# Patient Record
Sex: Female | Born: 1945 | Race: White | Hispanic: No | Marital: Married | Smoking: Never smoker
Health system: Southern US, Community
[De-identification: ages and names within clinical notes are randomized; demographics above are authoritative.]

## PROBLEM LIST (undated history)

## (undated) DIAGNOSIS — M419 Scoliosis, unspecified: Secondary | ICD-10-CM

## (undated) DIAGNOSIS — T8859XA Other complications of anesthesia, initial encounter: Secondary | ICD-10-CM

## (undated) DIAGNOSIS — S6990XA Unspecified injury of unspecified wrist, hand and finger(s), initial encounter: Secondary | ICD-10-CM

## (undated) DIAGNOSIS — M1990 Unspecified osteoarthritis, unspecified site: Secondary | ICD-10-CM

## (undated) DIAGNOSIS — Z9889 Other specified postprocedural states: Secondary | ICD-10-CM

## (undated) DIAGNOSIS — H269 Unspecified cataract: Secondary | ICD-10-CM

## (undated) DIAGNOSIS — I10 Essential (primary) hypertension: Secondary | ICD-10-CM

## (undated) DIAGNOSIS — G709 Myoneural disorder, unspecified: Secondary | ICD-10-CM

## (undated) DIAGNOSIS — T4145XA Adverse effect of unspecified anesthetic, initial encounter: Secondary | ICD-10-CM

## (undated) DIAGNOSIS — C801 Malignant (primary) neoplasm, unspecified: Secondary | ICD-10-CM

## (undated) DIAGNOSIS — R112 Nausea with vomiting, unspecified: Secondary | ICD-10-CM

## (undated) HISTORY — DX: Unspecified osteoarthritis, unspecified site: M19.90

## (undated) HISTORY — DX: Essential (primary) hypertension: I10

## (undated) HISTORY — PX: KNEE SURGERY: SHX244

## (undated) HISTORY — DX: Unspecified injury of unspecified wrist, hand and finger(s), initial encounter: S69.90XA

## (undated) HISTORY — DX: Myoneural disorder, unspecified: G70.9

## (undated) HISTORY — DX: Scoliosis, unspecified: M41.9

## (undated) HISTORY — DX: Unspecified cataract: H26.9

## (undated) HISTORY — DX: Malignant (primary) neoplasm, unspecified: C80.1

## (undated) HISTORY — PX: TUBAL LIGATION: SHX77

## (undated) HISTORY — PX: SPINE SURGERY: SHX786

---
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
## 1998-06-20 ENCOUNTER — Ambulatory Visit (HOSPITAL_COMMUNITY): Admission: RE | Admit: 1998-06-20 | Discharge: 1998-06-20 | Payer: Self-pay | Admitting: Emergency Medicine

## 1998-08-03 ENCOUNTER — Other Ambulatory Visit: Admission: RE | Admit: 1998-08-03 | Discharge: 1998-08-03 | Payer: Self-pay | Admitting: Obstetrics and Gynecology

## 1999-07-24 ENCOUNTER — Other Ambulatory Visit: Admission: RE | Admit: 1999-07-24 | Discharge: 1999-07-24 | Payer: Self-pay | Admitting: Obstetrics and Gynecology

## 2000-07-24 ENCOUNTER — Other Ambulatory Visit: Admission: RE | Admit: 2000-07-24 | Discharge: 2000-07-24 | Payer: Self-pay | Admitting: Obstetrics and Gynecology

## 2000-10-21 HISTORY — PX: ABDOMINAL HYSTERECTOMY: SHX81

## 2001-03-10 ENCOUNTER — Encounter: Payer: Self-pay | Admitting: Obstetrics and Gynecology

## 2001-03-17 ENCOUNTER — Inpatient Hospital Stay (HOSPITAL_COMMUNITY): Admission: RE | Admit: 2001-03-17 | Discharge: 2001-03-19 | Payer: Self-pay | Admitting: Obstetrics and Gynecology

## 2001-03-17 ENCOUNTER — Encounter (INDEPENDENT_AMBULATORY_CARE_PROVIDER_SITE_OTHER): Payer: Self-pay

## 2004-10-21 HISTORY — PX: BREAST SURGERY: SHX581

## 2004-12-24 ENCOUNTER — Encounter: Admission: RE | Admit: 2004-12-24 | Discharge: 2004-12-24 | Payer: Self-pay | Admitting: General Surgery

## 2005-01-07 ENCOUNTER — Encounter (INDEPENDENT_AMBULATORY_CARE_PROVIDER_SITE_OTHER): Payer: Self-pay | Admitting: *Deleted

## 2005-01-07 ENCOUNTER — Ambulatory Visit (HOSPITAL_BASED_OUTPATIENT_CLINIC_OR_DEPARTMENT_OTHER): Admission: RE | Admit: 2005-01-07 | Discharge: 2005-01-07 | Payer: Self-pay | Admitting: General Surgery

## 2005-01-07 ENCOUNTER — Ambulatory Visit (HOSPITAL_COMMUNITY): Admission: RE | Admit: 2005-01-07 | Discharge: 2005-01-07 | Payer: Self-pay | Admitting: General Surgery

## 2005-01-08 ENCOUNTER — Ambulatory Visit: Payer: Self-pay | Admitting: Oncology

## 2005-01-16 ENCOUNTER — Ambulatory Visit (HOSPITAL_COMMUNITY): Admission: RE | Admit: 2005-01-16 | Discharge: 2005-01-16 | Payer: Self-pay | Admitting: Oncology

## 2005-01-28 ENCOUNTER — Ambulatory Visit: Admission: RE | Admit: 2005-01-28 | Discharge: 2005-04-22 | Payer: Self-pay | Admitting: Radiation Oncology

## 2005-03-19 ENCOUNTER — Ambulatory Visit: Payer: Self-pay | Admitting: Oncology

## 2005-05-08 ENCOUNTER — Ambulatory Visit: Admission: RE | Admit: 2005-05-08 | Discharge: 2005-05-08 | Payer: Self-pay | Admitting: Radiation Oncology

## 2005-07-12 ENCOUNTER — Ambulatory Visit: Payer: Self-pay | Admitting: Oncology

## 2006-01-07 DIAGNOSIS — C801 Malignant (primary) neoplasm, unspecified: Secondary | ICD-10-CM

## 2006-01-07 HISTORY — DX: Malignant (primary) neoplasm, unspecified: C80.1

## 2006-01-10 ENCOUNTER — Ambulatory Visit: Payer: Self-pay | Admitting: Oncology

## 2006-06-27 ENCOUNTER — Encounter: Admission: RE | Admit: 2006-06-27 | Discharge: 2006-06-27 | Payer: Self-pay | Admitting: Orthopedic Surgery

## 2006-07-03 ENCOUNTER — Ambulatory Visit: Payer: Self-pay | Admitting: Oncology

## 2006-07-07 LAB — CBC WITH DIFFERENTIAL/PLATELET
Basophils Absolute: 0.1 10*3/uL (ref 0.0–0.1)
Eosinophils Absolute: 0.1 10*3/uL (ref 0.0–0.5)
HGB: 12.6 g/dL (ref 11.6–15.9)
MCV: 91.7 fL (ref 81.0–101.0)
MONO%: 7 % (ref 0.0–13.0)
NEUT#: 2.9 10*3/uL (ref 1.5–6.5)
Platelets: 190 10*3/uL (ref 145–400)
RDW: 12.6 % (ref 11.3–14.5)

## 2006-07-07 LAB — COMPREHENSIVE METABOLIC PANEL
Albumin: 4.2 g/dL (ref 3.5–5.2)
CO2: 23 mEq/L (ref 19–32)
Calcium: 9.5 mg/dL (ref 8.4–10.5)
Chloride: 107 mEq/L (ref 96–112)
Glucose, Bld: 100 mg/dL — ABNORMAL HIGH (ref 70–99)
Sodium: 140 mEq/L (ref 135–145)
Total Bilirubin: 0.4 mg/dL (ref 0.3–1.2)
Total Protein: 6.6 g/dL (ref 6.0–8.3)

## 2006-07-15 ENCOUNTER — Ambulatory Visit: Payer: Self-pay | Admitting: Gastroenterology

## 2006-07-31 ENCOUNTER — Ambulatory Visit (HOSPITAL_COMMUNITY): Admission: RE | Admit: 2006-07-31 | Discharge: 2006-07-31 | Payer: Self-pay | Admitting: Gastroenterology

## 2006-08-07 ENCOUNTER — Ambulatory Visit: Payer: Self-pay | Admitting: Gastroenterology

## 2006-09-18 ENCOUNTER — Inpatient Hospital Stay (HOSPITAL_COMMUNITY): Admission: RE | Admit: 2006-09-18 | Discharge: 2006-09-21 | Payer: Self-pay | Admitting: Orthopaedic Surgery

## 2006-12-30 ENCOUNTER — Ambulatory Visit: Payer: Self-pay | Admitting: Oncology

## 2007-01-06 LAB — COMPREHENSIVE METABOLIC PANEL
ALT: 17 U/L (ref 0–35)
Albumin: 4.1 g/dL (ref 3.5–5.2)
CO2: 27 mEq/L (ref 19–32)
Chloride: 111 mEq/L (ref 96–112)
Glucose, Bld: 127 mg/dL — ABNORMAL HIGH (ref 70–99)
Potassium: 4.2 mEq/L (ref 3.5–5.3)
Sodium: 145 mEq/L (ref 135–145)
Total Protein: 6.6 g/dL (ref 6.0–8.3)

## 2007-01-06 LAB — CANCER ANTIGEN 27.29: CA 27.29: 20 U/mL (ref 0–39)

## 2007-01-06 LAB — CBC WITH DIFFERENTIAL/PLATELET
BASO%: 0.6 % (ref 0.0–2.0)
Eosinophils Absolute: 0.1 10*3/uL (ref 0.0–0.5)
MONO#: 0.3 10*3/uL (ref 0.1–0.9)
NEUT#: 3.6 10*3/uL (ref 1.5–6.5)
Platelets: 194 10*3/uL (ref 145–400)
RBC: 3.91 10*6/uL (ref 3.70–5.32)
RDW: 14.1 % (ref 11.3–14.5)
WBC: 6.3 10*3/uL (ref 3.9–10.0)
lymph#: 2.2 10*3/uL (ref 0.9–3.3)

## 2007-07-01 ENCOUNTER — Ambulatory Visit: Payer: Self-pay | Admitting: Oncology

## 2007-07-03 LAB — CBC WITH DIFFERENTIAL/PLATELET
BASO%: 0.7 % (ref 0.0–2.0)
EOS%: 1.6 % (ref 0.0–7.0)
LYMPH%: 40.2 % (ref 14.0–48.0)
MCH: 32.2 pg (ref 26.0–34.0)
MCHC: 35.3 g/dL (ref 32.0–36.0)
MONO#: 0.4 10*3/uL (ref 0.1–0.9)
RBC: 3.73 10*6/uL (ref 3.70–5.32)
WBC: 6.3 10*3/uL (ref 3.9–10.0)
lymph#: 2.5 10*3/uL (ref 0.9–3.3)

## 2007-07-03 LAB — COMPREHENSIVE METABOLIC PANEL
ALT: 21 U/L (ref 0–35)
AST: 20 U/L (ref 0–37)
CO2: 20 mEq/L (ref 19–32)
Creatinine, Ser: 0.97 mg/dL (ref 0.40–1.20)
Sodium: 138 mEq/L (ref 135–145)
Total Bilirubin: 0.3 mg/dL (ref 0.3–1.2)
Total Protein: 6.7 g/dL (ref 6.0–8.3)

## 2007-07-03 LAB — CANCER ANTIGEN 27.29: CA 27.29: 23 U/mL (ref 0–39)

## 2008-01-01 ENCOUNTER — Ambulatory Visit: Payer: Self-pay | Admitting: Oncology

## 2008-11-03 ENCOUNTER — Encounter: Admission: RE | Admit: 2008-11-03 | Discharge: 2008-11-03 | Payer: Self-pay | Admitting: Orthopaedic Surgery

## 2009-01-06 ENCOUNTER — Ambulatory Visit: Payer: Self-pay | Admitting: Oncology

## 2009-01-10 LAB — COMPREHENSIVE METABOLIC PANEL
AST: 17 U/L (ref 0–37)
Alkaline Phosphatase: 32 U/L — ABNORMAL LOW (ref 39–117)
BUN: 13 mg/dL (ref 6–23)
Calcium: 9.3 mg/dL (ref 8.4–10.5)
Creatinine, Ser: 1.02 mg/dL (ref 0.40–1.20)

## 2009-01-10 LAB — CBC WITH DIFFERENTIAL/PLATELET
Basophils Absolute: 0 10*3/uL (ref 0.0–0.1)
EOS%: 1.4 % (ref 0.0–7.0)
HCT: 32.9 % — ABNORMAL LOW (ref 34.8–46.6)
HGB: 11.3 g/dL — ABNORMAL LOW (ref 11.6–15.9)
MCH: 31.5 pg (ref 25.1–34.0)
MCV: 92.2 fL (ref 79.5–101.0)
MONO%: 6.5 % (ref 0.0–14.0)
NEUT%: 50.4 % (ref 38.4–76.8)

## 2010-01-05 ENCOUNTER — Ambulatory Visit: Payer: Self-pay | Admitting: Oncology

## 2010-01-09 LAB — CBC WITH DIFFERENTIAL/PLATELET
BASO%: 0.5 % (ref 0.0–2.0)
EOS%: 1.3 % (ref 0.0–7.0)
HCT: 35.6 % (ref 34.8–46.6)
MCH: 32.3 pg (ref 25.1–34.0)
MCHC: 34 g/dL (ref 31.5–36.0)
NEUT%: 53.6 % (ref 38.4–76.8)
RBC: 3.75 10*6/uL (ref 3.70–5.45)
lymph#: 2.9 10*3/uL (ref 0.9–3.3)

## 2010-01-09 LAB — COMPREHENSIVE METABOLIC PANEL
ALT: 26 U/L (ref 0–35)
AST: 27 U/L (ref 0–37)
Calcium: 9.6 mg/dL (ref 8.4–10.5)
Chloride: 110 mEq/L (ref 96–112)
Creatinine, Ser: 1.05 mg/dL (ref 0.40–1.20)
Sodium: 143 mEq/L (ref 135–145)
Total Bilirubin: 0.3 mg/dL (ref 0.3–1.2)

## 2010-07-30 ENCOUNTER — Ambulatory Visit: Payer: Self-pay | Admitting: Vascular Surgery

## 2010-10-21 HISTORY — PX: EYE SURGERY: SHX253

## 2010-10-30 ENCOUNTER — Ambulatory Visit: Admit: 2010-10-30 | Payer: Self-pay | Admitting: Vascular Surgery

## 2010-10-30 ENCOUNTER — Ambulatory Visit
Admission: RE | Admit: 2010-10-30 | Discharge: 2010-10-30 | Payer: Self-pay | Source: Home / Self Care | Attending: Vascular Surgery | Admitting: Vascular Surgery

## 2010-11-11 ENCOUNTER — Encounter: Payer: Self-pay | Admitting: Orthopedic Surgery

## 2010-12-03 ENCOUNTER — Other Ambulatory Visit (INDEPENDENT_AMBULATORY_CARE_PROVIDER_SITE_OTHER): Payer: Medicare Other | Admitting: Vascular Surgery

## 2010-12-03 DIAGNOSIS — I83893 Varicose veins of bilateral lower extremities with other complications: Secondary | ICD-10-CM

## 2010-12-04 NOTE — Assessment & Plan Note (Signed)
OFFICE VISIT  [name], [name] DOB:  Mar 25, 1946                                       12/03/2010 [id number]#:[id number]  Patient had 10-20 stab phlebectomies in the left posterior calf and popliteal area for painful varicosities.  She had previously had sclerotherapy performed at [facility] in 2007.  Stab phlebectomies were performed under local tumescent anesthesia, and she will return in about 6 weeks to be scheduled for 1 course of sclerotherapy to complete her treatment session.    [name], M.D. Electronically Signed  JDL/MEDQ  D:  12/03/2010  T:  12/04/2010  Job:  [id number]

## 2011-01-10 ENCOUNTER — Ambulatory Visit (INDEPENDENT_AMBULATORY_CARE_PROVIDER_SITE_OTHER): Payer: Medicare Other

## 2011-01-10 DIAGNOSIS — I83893 Varicose veins of bilateral lower extremities with other complications: Secondary | ICD-10-CM

## 2011-03-05 NOTE — Procedures (Signed)
LOWER EXTREMITY VENOUS REFLUX EXAM   INDICATION:  Painful lower extremity veins.   EXAM:  Using color-flow imaging and pulse Doppler spectral analysis, the  bilateral common femoral, superficial femoral, popliteal, posterior  tibial, greater and lesser saphenous veins were evaluated.  There is  evidence suggesting deep venous insufficiency in the bilateral lower  extremity (CFV, SFV).   The bilateral saphenofemoral junction is competent.  The bilateral GSV  is competent.  The bilateral anterior saphenous vein is competent.   The bilateral proximal short saphenous vein demonstrates competency.   The left SFV has 2 large branches at mid thigh that become superficial  and varicosed posterolaterally.   GSV Diameter (used if found to be incompetent only)                                            Right    Left  Proximal Greater Saphenous Vein           cm       cm  Proximal-to-mid-thigh                     cm       cm  Mid thigh                                 cm       cm  Mid-distal thigh                          cm       cm  Distal thigh                              cm       cm  Knee                                      cm       cm   IMPRESSION:  1. The bilateral greater saphenous vein is competent.  2. The bilateral greater saphenous vein is not tortuous.  3. The deep venous system is not competent with reflux of >500      milliseconds.  4. The bilateral short saphenous vein is competent.  5. Branches observed at left mid thigh superficial femoral vein that      become superficial and varicosed.         ___________________________________________  Quita Skye Hart [place], M.D.   LT/MEDQ  D:  10/30/2010  T:  10/30/2010  Job:  [id number]

## 2011-03-05 NOTE — Consult Note (Signed)
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  She has become increasingly  symptomatic over the last 4 years.  She had treatment performed at [facility] in 2007 which consisted of primary  sclerotherapy.  She states that she had some initial relief, but these  rapidly recurred and she has been having increasing throbbing, burning,  aching discomfort with a heaviness behind her left knee and swelling as  the day progresses.  She does not wear elastic compression stockings nor  elevate the legs on a regular basis or take pain medication.  She has  had no history of bleeding ulceration, but mainly has pain and swelling  affecting her daily living.  She has multiple spider veins in the  contralateral right leg, but no symptoms.   CHRONIC MEDICAL PROBLEMS:  1. Hypertension.  2. Hyperlipidemia.  3. Scoliosis with previous lumbar spine surgery on two occasions.  4. History of breast cancer treated by radiation and medication.  5. Negative coronary artery disease, diabetes or stroke.   SOCIAL HISTORY:  She is married, has two children, is retired, does not  use tobacco or alcohol.   FAMILY HISTORY:  Positive for congestive heart failure, diabetes in  mother.  Negative for stroke.   REVIEW OF SYSTEMS:  Denies any chest pain, dyspnea on exertion.  No  productive cough, hemoptysis, asthma.  Does have arthritis.  Denies any  GI or GU symptoms.  All other systems are negative in review of systems.   PHYSICAL EXAM:  VITAL SIGNS:  Blood pressure 128/83, heart rate 95,  respirations 24.  GENERAL:  She is well-developed, well-nourished female in no apparent  distress.  Alert and oriented x3.  HEENT:  Exam is normal for age.  EOMs intact.  LUNGS:  Clear to  auscultation.  No rhonchi or wheezing.  CARDIOVASCULAR:  Regular rhythm.  No murmurs.  Carotid pulses 3+ no  bruits.  ABDOMEN:  Soft, nontender with no masses.  MUSCULOSKELETAL:  Exam is free of major deformities.  NEUROLOGY:  Normal.  SKIN:  Free of rashes.  EXTREMITIES:  Lower extremity exam reveals 3+ femoral, popliteal and  dorsalis pedis pulses bilaterally.  Left leg has bulging varicosities in  the popliteal fossa beginning in the distal thigh extending laterally  and then into the medial calf with diffuse spider veins in the calf as  well.  There is no hyperpigmentation or ulceration noted in the left  leg.  Right leg has multiple spider veins in the anterior thigh  laterally and down to the knee level.  Some also in the posterior calf  on the right with no edema.   I did perform a bedside SonoSite ultrasound exam today of the great  saphenous veins, did not see any gross reflux in either great saphenous  vein at the saphenofemoral junction.   I think this patient is having significant symptoms from bulging  varicosities in the left calf.  Will treat her initially with long-leg  elastic compression stockings bilaterally (20 mm - 30 mL gradient) as  well as elevation and ibuprofen on a  regular basis.  She will return in  3 months and we will perform a formal venous duplex exam in the vascular  lab to see what treatment options are available to try to relieve the  symptoms.     Quita Skye Hart Rochester, M.D.  Electronically Signed   JDL/MEDQ  D:  07/30/2010  T:  07/31/2010  Job:  [id number]   cc:   Brett Canales A. Cleta Alberts, M.D.

## 2011-03-05 NOTE — Assessment & Plan Note (Signed)
OFFICE VISIT   [name], [name]  DOB:  1946-08-09                                       10/30/2010  [id number]#:[id number]   Patient returns today for further follow-up regarding venous  insufficiency of the left calf.  She has had sclerotherapy performed at  [facility] in 2007 and developed recurrence over  a period of time and now has severe throbbing, burning, and aching  discomfort in the left calf with heaviness behind the knee and swelling  in the distal ankle as the day compresses.  She has worn long-leg  elastic compression stockings over the past 3 months (20 mm - 30 mm  gradient), has elevating her legs as much as possible, and taken  ibuprofen without success.  She does not have claudication symptoms and  has no symptoms in the contralateral right leg.   PHYSICAL EXAMINATION:  Today, her blood pressure is 124/82, heart rate  is 87, respirations 20.  Generally, she is a well-developed and well-  nourished female in no apparent distress, alert and oriented x3.  HEENT:  Normal for age.  EOMs intact.  Lower extremity exam on the left reveals  3+ femoral, popliteal, and dorsalis pedis pulse palpable.  She has  bulging varicosities on the left popliteal fossa extending into the mid  calf with reticular veins emanating from this down distally in the  posterior calf.  There is 1+ edema in the left ankle.  The right leg is  unremarkable.   Today I ordered a venous duplex exam, which I reviewed and interpreted.  The great saphenous and small saphenous veins in the left are competent  and unremarkable.  The deep venous system on the left, however, is  incompetent with reflux due to valvular incompetence.   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  I think the best treatment would be:  1.  Stab  phlebectomies of the  bulging varicosities in the calf.  2:  One course  of sclerotherapy.   I have discussed with her the fact that this could recur at a later date  because of her deep venous insufficiency we cannot treat except for  elastic compression, but I do believe that the bulging varicosities are  causing the symptoms which she is currently experiencing, which could be  treated locally.  We will proceed with precertification for this and  performed in the near future.     Quita Skye Hart Rochester, M.D.  Electronically Signed   JDL/MEDQ  D:  10/30/2010  T:  10/30/2010  Job:  [id number]

## 2011-03-08 NOTE — H&P (Signed)
NAMEGWENDOLEN, [name]                  ACCOUNT NO.:  [id number]   MEDICAL RECORD NO.:  [id number]          PATIENT TYPE:  INP   LOCATION:  NA                           FACILITY:  [facility]   PHYSICIAN:  Verlin Fester, P.A.    DATE OF BIRTH:  1945/12/28   DATE OF ADMISSION:  DATE OF DISCHARGE:                                HISTORY & PHYSICAL   CHIEF COMPLAINT:  Low back pain.   HISTORY OF PRESENT ILLNESS:  Patient is a 65 year old female who has had  longstanding problems with her low back.  She has tried numerous types of  conservative treatment, all without any significant improvement in her pain.  Secondary to this, as well as her increasing and severe pain, it was felt  her best course of management would be a revision laminectomy and posterior  spinal fusion L2-L4, as well as a posterior spinal fusion L3-4.  Risks and  benefits of both procedures were discussed with the patient by Dr. Noel Gerold as  well as myself.  She indicated understanding and opted to proceed.   ALLERGIES:  None.   MEDICATIONS:  1. Tamoxifen 50 mg daily.  2. Lisinopril 10 mg daily.  3. Celebrex 200 mg daily.  4. Simvastatin 40 mg daily.  5. Aspirin 81 mg daily, which she is stopping in preparation for surgery.  6. Niacin 500 mg daily.  7. Omega 3 fish oil 1000 mg daily.  8. B-complex vitamin daily.   PAST MEDICAL HISTORY:  1. Hypertension  2. Breast cancer.  3. Hypercholesterolemia.   PAST SURGICAL HISTORY:  1. Spinal fusion in 1982.  2. Hysterectomy in 2002.  3. Lumpectomy in 2006.   SOCIAL HISTORY:  Patient denies tobacco use, denies alcohol use.  She is  married, has 2 grown children.  Her family will be available to help her  postoperatively.   FAMILY HISTORY:  Noncontributory.   REVIEW OF SYSTEMS:  Patient denies fevers, chills, sweats, or bleeding  tendencies.  CNS:  Denies blurred vision, double vision, seizures, headache, or  paralysis.  CARDIOVASCULAR:  Denies chest pain, angina, orthopnea,  claudication, or  palpitations.  PULMONARY:  Denies shortness of breath, productive cough, hemoptysis.  GI:  Denies nausea, vomiting, constipation, diarrhea, melena, or bloody  stools  GU:  Denies dysuria, hematuria, or discharge.  MUSCULOSKELETAL:  As per HPI.   PHYSICAL EXAMINATION:  Vital signs:  Blood pressure is 118/62, respirations  18 and unlabored, pulse was 84 and regular.  GENERAL APPEARANCE:  Patient is a 65 year old white female who is alert and  oriented, no acute distress.  She is well nourished, well groomed, appears  stated age; cooperative with exam.  HEENT:  Head is normocephalic, atraumatic.  Pupils are equal, round, and  reactive to light.  Extraocular movements intact.  Nares are patent, pharynx  is clear.  NECK:  Is supple to palpation, no lymphadenopathy, thyromegaly, or bruits  appreciated.  CHEST:  Is clear to auscultation bilaterally.  No rales, rhonchi, stridor,  wheezes, or friction rubs.  BREAST:  Not pertinent, not performed.  HEART:  Regular rate  and rhythm.  No murmurs, gallops, or rubs noted.  ABDOMEN:  Is soft to palpation, non tender, non distended, no organomegaly  noted.  GU:  Not pertinent, not performed.  EXTREMITIES:  As per HPI.  SKIN:  Is intact without any lesions or rashes.   IMPRESSION:  1. Degenerative spondylolisthesis and spinal stenosis.  2. History of breast cancer.  3. Hypertension.  4. Hypercholesterolemia.   PLAN:  1. Admit to [facility] on September 18, 2006 for revision      laminectomy L2-4, and a posterior spinal fusion of L3-4.  Surgeon will      be Dr. Sharolyn Douglas.  2. Patient's primary care doctor is Dr. Lesle Chris.      Verlin Fester, P.A.     CM/MEDQ  D:  09/04/2006  T:  09/04/2006  Job:  [id number]   cc:   Sharolyn Douglas, M.D.

## 2011-03-08 NOTE — Op Note (Signed)
[name], [name]                  ACCOUNT NO.:  [id number]   MEDICAL RECORD NO.:  [id number]          PATIENT TYPE:  INP   LOCATION:  5008                         FACILITY:  [facility]   PHYSICIAN:  Sharolyn Douglas, M.D.        DATE OF BIRTH:  1946/08/19   DATE OF PROCEDURE:  09/18/2006  DATE OF DISCHARGE:                               OPERATIVE REPORT   DIAGNOSIS:  1. L3-L4 degenerative spondylolisthesis above previous L4 through S1      fusion.  2. Lumbar spinal stenosis L3-L4 and L2-L3.  3. Post laminectomy syndrome.   PROCEDURE:  1. Revision L2-L3 and L3-L4 lumbar laminectomy with wide decompression      of the thecal sac and nerve roots bilaterally.  2. Posterior lumbar fusion L3-L4.  3. Pedicle screw instrumentation L3-L4 using the Encompass system.  4. Transforaminal lumbar interbody fusion L3-L4 with placement of 9-mm      PEEK cage.  5. Local autogenous bone graft supplemented with bone morphogenic      protein.   SURGEON:  Sharolyn Douglas, M.D.   ASSISTANTJill Side Mahar, P.A.-C.   ANESTHESIA:  General endotracheal.   ESTIMATED BLOOD LOSS:  20 mL.   COMPLICATIONS:  None.   COUNTS:  Needle and sponge count correct.   INDICATIONS:  The patient is a pleasant 65 year old female who is status  post previous L4 through S1 decompression and fusion.  She has had  progressively worsening back and lower extremity pain, left greater than  right.  Her imaging studies shows severe spondylosis with degenerative  spondylolisthesis above the fusion at L3-L4.  MRI shows severe recurrent  spinal stenosis above the fusion at L2-L3 and L3-L4.  She now presents  for revision, decompression and fusion.  The risks, benefits, and  alternatives were reviewed.   DESCRIPTION OF PROCEDURE:  After informed consent, she was taken to the  operating room and underwent general endotracheal anesthesia without  difficulty and given prophylactic IV antibiotics.  She was carefully  turned prone onto the  Wilson frame.  All bony prominences padded.  Her  face and eyes were protected at all times.  The back was prepped and  draped in the usual sterile fashion.  Neural monitoring had been  established in the form of lower extremity EMGs and SSEPs.  The superior  portion of the previous incision was utilized and extended proximally.  Dissection was carried through the scar and deep fascia.  Subperiosteal  exposure was carried out to the tips of the transverse process of L3 and  L4 bilaterally.  The facet joints were hypertrophied.  Interoperative x-  ray was taken to confirm the levels.  A deep retractor was placed.  We  then completed a wide laminectomy by removing the residual spinous  process of L3 and the spinous process of L2.  Using the high speed bur  and Leksell rongeur, the laminae were thinned.  We then identified the  edges of the previous laminectomy and used curets along with headlight  and loupe magnification to carefully dissect the epidural fibrosis.  We  were then able to gain access to the spinal canal and extended the  decompression proximally.  We then took the decompression out flush with  the pedicles bilaterally, identifying the L3 and L4 nerve roots and  confirming that they were decompressed in the lateral recess and out  towards the foramen.   At this point, we turned our attention to placing pedicle screws at L3  and L4 using anatomic probing technique.  Because of the distorted  anatomy, we were able to palpate the pedicles of L4 from within the  spinal canal.  Each pedicle hole was started with the awl followed by  the Stephe pedicle probe.  We palpated the pedicles and there were no  breeches.  Each pedicle was tapped.  We then placed 6.5 x 50 mm screws  at L3 and 6.5 by 45 mm screws at L4.  Bone quality was average and the  screw purchase was good.  We stimulated each pedicle screw with  triggered EMGs and there were no deleterious changes.  It should be  noted  that before placing pedicle screws, we decorticated the transverse  processes of L3 and L4 in preparation for the fusion.   At this point, we elected to proceed with a transforaminal lumbar  interbody fusion at L3-L4 on the left side to further decompress the  neural foramen to help reduce the spondylolisthesis and also to improve  the fusion rate.  The remaining facet joint was osteotomized on the left  side.  The exiting transversing nerve roots were identified and free  running EMGs were monitored.  The disc space was entered and a radical  discectomy was completed. The cartilaginous endplates were scraped  clean.  The disc space was dilated up to 9 mm.  We then packed the disc  space with local bone graft from the laminectomy along with BMP sponges.  A 9 mm PEEK cage was then packed with a BMP sponge and inserted into the  interspace, carefully tamped anteriorly and across the midline.  We had  no changes in the free running EMGs throughout the procedure.  We then  turned our attention to placing 40 mm titanium rods into the polyaxial  screw heads.  Gentle compression was applied before shearing off the  locking caps..  Interoperative x-ray showed good position of the  instrumentation at L3-L4.  We then completed the posterior spinal fusion  by packing the remaining local bone graft into the gutters over the  transverse processes.  This graft had been obtained from the  laminectomy, cleaned and morselized.   Hemostasis was achieved.  A deep Hemovac drain was left and the deep  fascia closed with a running #1 Vicryl suture.  The subcutaneous layer  was closed with 2-0 Vicryl suture followed by a running 3-0 subcuticular  Vicryl suture on the skin edges.  Benzoin and Steri-Strips were placed.  A sterile dressing was applied.  The patient was turned supine,  extubated without difficulty, and transferred to recovery in stable condition able to move her upper and lower extremities.   It  should be noted my assistant [facility], P.A.-C., was present  throughout procedure including during the positioning, the exposure, the  decompression, the fusion, the instrumentation, and she also assisted  with wound closure.      Sharolyn Douglas, M.D.  Electronically Signed     MC/MEDQ  D:  09/18/2006  T:  09/18/2006  Job:  [id number]

## 2011-03-08 NOTE — Discharge Summary (Signed)
NAMECHANEE, HENRICKSON                  ACCOUNT NO.:  [id number]   MEDICAL RECORD NO.:  [id number]          PATIENT TYPE:  INP   LOCATION:  5008                         FACILITY:  [facility]   PHYSICIAN:  Verlin Fester, P.A.    DATE OF BIRTH:  July 19, 1946   DATE OF ADMISSION:  09/18/2006  DATE OF DISCHARGE:  09/21/2006                               DISCHARGE SUMMARY   ADMITTING DIAGNOSES:  1. L2 to L4 spinal stenosis and L3-4 spondylolisthesis.  2. Hypertension.  3. History of breast cancer.  4. Hypercholesterolemia.   DISCHARGE DIAGNOSES:  1. Status post revision of laminectomy L2 to L4, posterior spinal      fusion L3-4.  2. Postoperative blood loss anemia.  3. Postoperative hypokalemia.  4. L2 to L4 spinal stenosis and L3-4 spondylolisthesis.  5. Hypertension.  6. History of breast cancer.  7. Hypercholesterolemia.   PROCEDURE:  On September 18, 2006, the patient was taken to the operating  room for a revision laminectomy L2-L4 and a posterior spinal fusion L3-4  with pedicle screws and TLIF.  Surgery was done by Sharolyn Douglas, assistant  [facility], P.A.-C.  Anesthesia was general.   CONSULTS:  None.   LABS:  CBC with differential preop was normal.  H&H postop was monitored  daily.  Reached a low of 9.6 and 27.8 on September 20, 2006.  PT/INR, PTT  preop normal.  Complete metabolic panel preop normal with exception of  glucose of 102 and ALP of 34.  Basic metabolic panel monitored x2 days  postoperatively did show a glucose of 128 on September 19, 2006; glucose  of 131, potassium of 3.3, and calcium of 8.3 on September 20, 2006.  UA  was negative preop.  Blood type was A+, antibody screen was negative.  Urine culture showed insignificant growth on September 17, 2006.  X-rays  done on September 18, 2006, intraoperatively.  On September 21, 2006,  showed stable appearance of L3-4 fusion.   BRIEF HISTORY:  The patient is a 65 year old female with a long history  of problems with her back.   The patient's pain has been increasingly  getting worse over the last several months.  It has gotten to the point  that it is severe.  Her imaging studies did show severe spondylosis with  degenerative spondylolisthesis, as well spinal stenosis.  She failed to  respond to conservative treatment, __________ management and agreed to  decompression as well as posterior spinal fusion.  The risks and  benefits of the surgery were discussed with the patient by Dr. Noel Gerold.  She indicated understanding and opted to proceed.   HOSPITAL COURSE:  The patient was admitted to the hospital on September 18, 2006, was taken to the operating room for the above procedure.  She  tolerated the procedure well without any intraoperative complications.  She was transferred to the recovery room in stable condition.   Postoperatively routine orthopedic spine protocol was followed.  The  patient progressed along very well.   She worked with physical therapy and occupational therapy on a daily  basis and made  good progress with them.  She was safe and independent  with her brace use, ambulation, as well as back precautions prior to her  discharge home.   By September 21, 2006, the patient had met all orthopedic goals.  She was  doing very well and she was medically stable and ready for discharge  home.   DISCHARGE PLAN:  The patient is a 65 year old female status post  posterior spinal fusion and laminectomy revision, doing well.  Activity  is daily ambulation program.  Brace on when she is up.  No lifting  heavier than 5 pounds.  Back precautions at all times.  The patient may  shower.   MEDICATIONS:  1. Percocet for pain.  2. Robaxin for muscle spasms.  3. Multivitamin daily.  4. Calcium daily.  5. Colace twice daily.  6. Laxative as needed.   FOLLOWUP:  Two weeks postoperatively with Dr. Noel Gerold.   DIET:  Regular and as tolerated.   CONDITION ON DISCHARGE:  Stable and improved.      Verlin Fester, P.A.     CM/MEDQ  D:  11/27/2006  T:  11/27/2006  Job:  [id number]

## 2011-03-08 NOTE — Assessment & Plan Note (Signed)
[facility]                           GASTROENTEROLOGY OFFICE NOTE   NAME:[name]                       MRN:          [id number]  DATE:07/15/2006                            DOB:          [birth year]    REFERRING PHYSICIAN:  Valentino Hue. Magrinat, M.D.   REASON FOR REFERRAL:  Dr. Darnelle Catalan asked me to evaluate [name] in  consultation regarding abnormal pancreas on recent MRI.   HISTORY OF PRESENT ILLNESS:  [name] is a very pleasant 65 year old woman  who was diagnosed with early stage breast cancer one year ago. She underwent  lumpectomy and radiation but no chemotherapy and has been doing very well on  followup studies. She recently had some back pains. Underwent imaging. This  imaging study found some renal cysts and was followed up with an MRI. The  MRI confirmed multiple renal cysts and was followed up with an MRI. The MRI  confirmed multiple renal cysts and also showed a cystic lesion in the head  of her pancreas. I reviewed the MRI myself and discussed this with  radiologist. There is a possibly bilobed cystic area in the head of her  pancreas. This does seem to communicate with the common bile duct. She has  no ductal dilation of her common bile duct or her pancreatic duct. She was  referred to me for potential further evaluation of this area. Of note her  back pain has been found to be from degenerative disk disease, and she is  meeting with a surgeon tomorrow to schedule surgery for her back pain.   REVIEW OF SYSTEMS:  Noted for stable weight. No pancreatic disease. Rest of  Review of Systems is essentially normal and is available on our nursing  intake sheet.   PAST MEDICAL HISTORY:  Degenerative disk disease as above, breast cancer as  above, hypertension, arthritis, status post hysterectomy.   CURRENT MEDICATIONS:  Tamoxifen, lisinopril, fish oil, niacin, aspirin, B  complex.   ALLERGIES:  No known drug allergies.   SOCIAL  HISTORY:  Married with two daughters, nonsmoker, nondrinker.   FAMILY HISTORY:  No pancreatic disease or pancreatic cancers in the family.   PHYSICAL EXAMINATION:  VITAL SIGNS:  5 feet 7 inches, 182 pounds, blood  pressure 104/60, pulse 72.  CONSTITUTIONAL:  Generally well-appearing.  NEUROLOGICAL:  Alert and oriented x3.  HEENT:  Eyes:  Extraocular movements intact. Oropharynx moist. No lesions.  NECK:  Supple. No lymphadenopathy.  CARDIOVASCULAR:  Heart regular rate and rhythm.  LUNGS:  Clear to auscultation bilaterally.  ABDOMEN:  Soft, nontender, nondistended, normal bowel sounds.  EXTREMITIES:  No lower extremity edema.  SKIN:  No rashes or lesions in distal extremities.   ASSESSMENT AND PLAN:  A 64 year old woman with incidentally found cyst in  the head of her pancreas possibly communicating with her common bile duct.   It is not clear whether this represents a true pancreatic cyst or whether  this is a choledochal cyst which would probably be a type 3 if it is a  choledochal cyst. She has no concerning history. She is most  bothered by her  continued back pain. I think it is reasonable for her to go ahead and  proceed with spinal surgery as she was planning to do, and we will pick up  this issue afterwards. I will plan at that time to proceed with endoscopic  ultrasound plus or minus ERCP at that time. I think it is important to  define this lesion's communication with the bile duct and also to sample  some fluid from it if it is not connecting with the bile duct to relatively  risk stratify her. She will therefore get in touch with [place] after her spinal  surgery and when she is ready to proceed with the EUS plus or minus ERCP. I  see no reason for further imaging studies prior to then. However, she will  get a CBC, complete metabolic profile, and a CA19-9 now.                                   Rachael Fee, MD   DPJ/MedQ  DD:  07/15/2006  DT:  07/17/2006  Job #:   [id number]   cc:   Valentino Hue. Magrinat, M.D.

## 2011-11-04 ENCOUNTER — Other Ambulatory Visit: Payer: Self-pay | Admitting: Dermatology

## 2012-01-28 ENCOUNTER — Encounter: Payer: Self-pay | Admitting: Physician Assistant

## 2012-02-10 ENCOUNTER — Ambulatory Visit (INDEPENDENT_AMBULATORY_CARE_PROVIDER_SITE_OTHER): Payer: Medicare Other | Admitting: *Deleted

## 2012-02-10 VITALS — BP 124/74 | HR 73 | Temp 97.3°F | Resp 18 | Ht 65.0 in | Wt 185.8 lb

## 2012-02-10 DIAGNOSIS — Z23 Encounter for immunization: Secondary | ICD-10-CM

## 2012-02-10 MED ORDER — HEPATITIS B VAC RECOMBINANT 5 MCG/0.5ML IJ SUSP
0.5000 mL | Freq: Once | INTRAMUSCULAR | Status: AC
  Administered 2012-02-10: 5 ug via INTRAMUSCULAR

## 2012-08-20 ENCOUNTER — Encounter: Payer: Medicare Other | Admitting: Family Medicine

## 2012-09-11 ENCOUNTER — Ambulatory Visit (INDEPENDENT_AMBULATORY_CARE_PROVIDER_SITE_OTHER): Payer: Medicare Other | Admitting: Family Medicine

## 2012-09-11 ENCOUNTER — Encounter: Payer: Self-pay | Admitting: Family Medicine

## 2012-09-11 VITALS — BP 130/84 | HR 81 | Temp 98.2°F | Resp 16 | Ht 65.5 in | Wt 185.2 lb

## 2012-09-11 DIAGNOSIS — IMO0002 Reserved for concepts with insufficient information to code with codable children: Secondary | ICD-10-CM

## 2012-09-11 DIAGNOSIS — Z0000 Encounter for general adult medical examination without abnormal findings: Secondary | ICD-10-CM

## 2012-09-11 DIAGNOSIS — M5412 Radiculopathy, cervical region: Secondary | ICD-10-CM

## 2012-09-11 DIAGNOSIS — I10 Essential (primary) hypertension: Secondary | ICD-10-CM

## 2012-09-11 DIAGNOSIS — M5416 Radiculopathy, lumbar region: Secondary | ICD-10-CM

## 2012-09-11 DIAGNOSIS — M47812 Spondylosis without myelopathy or radiculopathy, cervical region: Secondary | ICD-10-CM

## 2012-09-11 LAB — COMPREHENSIVE METABOLIC PANEL
ALT: 19 U/L (ref 0–35)
Albumin: 4.4 g/dL (ref 3.5–5.2)
Alkaline Phosphatase: 53 U/L (ref 39–117)
Glucose, Bld: 99 mg/dL (ref 70–99)
Potassium: 4.1 mEq/L (ref 3.5–5.3)
Sodium: 139 mEq/L (ref 135–145)
Total Bilirubin: 0.6 mg/dL (ref 0.3–1.2)
Total Protein: 6.9 g/dL (ref 6.0–8.3)

## 2012-09-11 LAB — LIPID PANEL
HDL: 44 mg/dL (ref >39)
LDL Cholesterol: 160 mg/dL — ABNORMAL HIGH (ref 0–99)
Total CHOL/HDL Ratio: 5.4 Ratio
Triglycerides: 168 mg/dL — ABNORMAL HIGH (ref <150)
VLDL: 34 mg/dL (ref 0–40)

## 2012-09-11 LAB — CBC WITH DIFFERENTIAL/PLATELET
Basophils Absolute: 0.1 10*3/uL (ref 0.0–0.1)
Basophils Relative: 1 % (ref 0–1)
HCT: 38.5 % (ref 36.0–46.0)
Lymphocytes Relative: 43 % (ref 12–46)
MCHC: 33.8 g/dL (ref 30.0–36.0)
Monocytes Absolute: 0.3 10*3/uL (ref 0.1–1.0)
Neutro Abs: 2.8 10*3/uL (ref 1.7–7.7)
Neutrophils Relative %: 49 % (ref 43–77)
Platelets: 220 10*3/uL (ref 150–400)
RDW: 13.2 % (ref 11.5–15.5)
WBC: 5.7 10*3/uL (ref 4.0–10.5)

## 2012-09-11 MED ORDER — LISINOPRIL 10 MG PO TABS: 10.0000 mg | ORAL_TABLET | Freq: Every day | ORAL | Status: DC

## 2012-09-11 MED ORDER — GABAPENTIN 100 MG PO CAPS: 100.0000 mg | ORAL_CAPSULE | Freq: Three times a day (TID) | ORAL | Status: DC

## 2012-09-11 MED ORDER — MELOXICAM 15 MG PO TABS: 15.0000 mg | ORAL_TABLET | Freq: Every day | ORAL | Status: DC

## 2012-09-11 NOTE — Progress Notes (Signed)
Subjective:    Patient ID: [name], female    DOB: June 10, 1946, 66 y.o.   MRN: [id number] Chief Complaint  Patient presents with  . Annual Exam    HPI Having a lot more nerve pain so her orthopedic spine surgeon suggested that she see a neurosurgeon.  Has had injections in past which has helped.   Was seeing Dr. Ledon Snare who is no longer there Dr. Noel Gerold suggested doing another multi-level fusion - 5-6 levels but she is not interested in that but she had c-spine xrays done here and saw a lot of bone spurs so she assumes that is where she is coming from After her second fusion she took 2 yrs to recover and she had to quit her job as it was to active.  THe first one worked well - like a miracle - but doesn't want to do third Has been mentioned to her that she should try gabapentin and elavil - doesn't like to take medications so hasn't before now but sxs are worsening so she is ready. Has been on mobic for 8-10 yrs and worried about long term effects but doesn't think she is going to be able to get off of it as sxs seem to be worsening. Stomach is rumbling and aching at night. Was previously on celebrex and vioxx.  Fasting. Was previously on simvastatin but so was husband and he had some abnormal ekgs and stress test and the [facility] cardiologist told him that it is a killer - told him to throw away the statin as it was flushing out all the nourishment that muscles including the heart needed and did think that aches and pains got better after she stopped it.  HTN - thinks this is due to the meloxicam - bp drops when she is off of it.  Does not need any more paps as had a hysterectomy due to uterine prolapse (and had bladder tack)  Not on calcium supp as constipating but gets dairy in every day.  Past Medical History  Diagnosis Date  . Arthritis   . Cancer   . Neuromuscular disorder   . Cataract   . Hypertension    Past Surgical History  Procedure Date  . Breast surgery 2006    left    . Eye surgery 2012  . Spine surgery 1981 and 2007  . Abdominal hysterectomy 2002    have ovaries   Family History  Problem Relation Age of Onset  . Diabetes Mother     age early 83's  . Heart disease Mother     age early 40's or 80's  . Stroke Father 42    per pt mini  . Diabetes Sister 16    type II  . Kidney disease Brother     polycystic kidney  . Alcohol abuse Maternal Grandfather    Current Outpatient Prescriptions on File Prior to Visit  Medication Sig Dispense Refill  . b complex vitamins tablet Take 1 tablet by mouth daily.      . cholecalciferol (VITAMIN D) 1000 UNITS tablet Take 1,000 Units by mouth 2 (two) times daily.            Marland Kitchen lisinopril (PRINIVIL,ZESTRIL) 10 MG tablet Take 1 tablet (10 mg total) by mouth daily.  90 tablet  3  . Omega-3 Fatty Acids (FISH OIL) 1000 MG CAPS Take 1,000 mg by mouth 2 (two) times daily.       Allergies  Allergen Reactions  . Codeine Other (See  Comments)    DIZZINESS   History   Social History  . Marital Status: Married    Spouse Name: N/A    Number of Children: N/A  . Years of Education: N/A   Occupational History  . Not on file.   Social History Main Topics  . Smoking status: Never Smoker   . Smokeless tobacco: Not on file  . Alcohol Use: Not on file  . Drug Use: Not on file  . Sexually Active: Not on file   Other Topics Concern  . Not on file   Social History Narrative   Exercise swimming 3 times/wk and silver sneaker 2 times/wk for 1 hour    Review of Systems  Constitutional: Positive for activity change and fatigue.  Gastrointestinal: Positive for abdominal pain and constipation.  Musculoskeletal: Positive for myalgias, back pain, arthralgias and gait problem. Negative for joint swelling.  Neurological: Positive for weakness.  All other systems reviewed and are negative.      BP 130/84  Pulse 81  Temp 98.2 F (36.8 C) (Oral)  Resp 16  Ht 5' 5.5" (1.664 m)  Wt 185 lb 3.2 oz (84.006 kg)  BMI  30.35 kg/m2  SpO2 96% Objective:   Physical Exam  Constitutional: She is oriented to person, place, and time. She appears well-developed and well-nourished. No distress.  HENT:  Head: Normocephalic and atraumatic.  Right Ear: Tympanic membrane, external ear and ear canal normal.  Left Ear: Tympanic membrane, external ear and ear canal normal.  Nose: Nose normal. No mucosal edema or rhinorrhea.  Mouth/Throat: Uvula is midline, oropharynx is clear and moist and mucous membranes are normal. No posterior oropharyngeal erythema.  Eyes: Conjunctivae normal and EOM are normal. Pupils are equal, round, and reactive to light. Right eye exhibits no discharge. Left eye exhibits no discharge. No scleral icterus.  Neck: Normal range of motion. Neck supple. No thyromegaly present.  Cardiovascular: Normal rate, regular rhythm, normal heart sounds and intact distal pulses.   Pulmonary/Chest: Effort normal and breath sounds normal. No respiratory distress.  Abdominal: Soft. Bowel sounds are normal. There is no tenderness.  Genitourinary: No breast swelling, tenderness, discharge or bleeding.  Musculoskeletal: She exhibits tenderness. She exhibits no edema.  Lymphadenopathy:    She has no cervical adenopathy.  Neurological: She is alert and oriented to person, place, and time. She has normal reflexes.  Skin: Skin is warm and dry. She is not diaphoretic. No erythema.  Psychiatric: She has a normal mood and affect. Her behavior is normal.      Assessment & Plan:   1. Cervical arthritis  meloxicam (MOBIC) 15 MG tablet  2. Routine general medical examination at a health care facility  Lipid panel, CBC with Differential, Comprehensive metabolic panel.  Going to have mammogram early feb at [place] - will add on dexa scan then too.  3. Cervical radiculopathy  discussed multiple options w/ pt - concerned about TCA side effects and she is senstive to meds to will start low dose of gabapentin (NEURONTIN) 100 MG  capsule qhs and titrate up slowly as tolerated to tid.  4. Lumbar radiculopathy, chronic  gabapentin (NEURONTIN) 100 MG capsule.  Difficult to afford topical lidocaine patches and not working as well now so will investigate a compounded topical solution through [facility] w/ topical anesthetic, muscle relaxant, nsaid.  5. Hypertension  Cont lisinopril (PRINIVIL,ZESTRIL) 10 MG tablet

## 2012-09-18 ENCOUNTER — Telehealth: Payer: Self-pay

## 2012-09-18 DIAGNOSIS — I10 Essential (primary) hypertension: Secondary | ICD-10-CM

## 2012-09-18 DIAGNOSIS — M5416 Radiculopathy, lumbar region: Secondary | ICD-10-CM

## 2012-09-18 DIAGNOSIS — M47812 Spondylosis without myelopathy or radiculopathy, cervical region: Secondary | ICD-10-CM

## 2012-09-18 DIAGNOSIS — M5412 Radiculopathy, cervical region: Secondary | ICD-10-CM

## 2012-09-18 MED ORDER — LISINOPRIL 10 MG PO TABS: 10.0000 mg | ORAL_TABLET | Freq: Every day | ORAL | Status: DC

## 2012-09-18 MED ORDER — GABAPENTIN 100 MG PO CAPS: 100.0000 mg | ORAL_CAPSULE | Freq: Three times a day (TID) | ORAL | Status: DC

## 2012-09-18 MED ORDER — MELOXICAM 15 MG PO TABS: 15.0000 mg | ORAL_TABLET | Freq: Every day | ORAL | Status: DC

## 2012-09-18 NOTE — Telephone Encounter (Signed)
Lisinopril Meloxicam and Gabapentin were sent in to [facility] at time of visit. Cancelled these

## 2012-09-18 NOTE — Telephone Encounter (Signed)
Patients Rxs cancelled at [facility], resubmitted to Mail order. Patient asks about a cream to replace the Lidoderm patch. Please advise patient wants this rx sent to mail order also

## 2012-09-18 NOTE — Telephone Encounter (Signed)
PATIENT STATES SHE WAS IN THE OFFICE TO SEE. DR. Clelia Croft ON Friday NOV. 22, FOR HER YEARLY PHYSICAL. SHE HAS NOT HEARD ANYTHING BACK FROM [place] REGARDING THE LAB RESULTS. SHE ALSO SAID NONE OF THE PRESCRIPTIONS HAD BEEN SENT TO HER MAIL-IN PHARMACY. SHE WAS WONDERING WHY SHE HAD NOT HEARD BACK FROM [facility] AND THEY TOLD HER THEY NEVER RECEIVED ANYTHING FROM [facility].  SHE NEEDS REFILLS ON LISINOPRIL 10MG , MELOXICAM 15MG , GABAPENTIN 100MG  AND A TOPICAL PAIN RELIEVER. PLEASE CALL HER WHEN WE HAVE FAXED THE INFORMATION OVER TO LET HER KNOW IT HAS BEEN DONE. [facility] PHONE [phone]      [facility] FAX [phone] BEST PHONE FOR PATIENT [phone] (HOME)  MBC

## 2012-09-25 DIAGNOSIS — M5412 Radiculopathy, cervical region: Secondary | ICD-10-CM | POA: Insufficient documentation

## 2012-09-25 DIAGNOSIS — I10 Essential (primary) hypertension: Secondary | ICD-10-CM | POA: Insufficient documentation

## 2012-09-25 DIAGNOSIS — M5416 Radiculopathy, lumbar region: Secondary | ICD-10-CM | POA: Insufficient documentation

## 2012-09-25 NOTE — Telephone Encounter (Signed)
Received order form and called sales rep. Placed order form w/info in Dr Alver Fisher box. When pt CB give her lab info from Dr Clelia Croft and discuss pravastatin Rx.

## 2012-09-25 NOTE — Telephone Encounter (Signed)
LMOM for pt to CB  I contacted [facility] and they are faxing an order form and [place] contact info to my attn.

## 2012-09-25 NOTE — Telephone Encounter (Signed)
ldl to high - needs to be <161 so I would recommend pt retry a statin as she is already trying diet and supplements.  We could try a low dose of pravastatin 20mg  po qhs disp 30, ref 2 - follow up in 2 mos to ensure she is tolerating the new meds. For the topical pain medication, I was hoping she could try a compounded cream from [facility] so it has to be ordered from them and I think there is a specific order form.  Would you mind please calling this company to see if they could send [place] an order form and then put it in my box?  Also, their drug rep was stating that at some points they could get the pt a sample to ensure that it works for the pt which would be great. I was going to rx whatever their most common preparation was (I think it is a combo of a topical anesthetic, nsaid, and muscle relaxant).  Thanks.

## 2012-09-28 MED ORDER — PRAVASTATIN SODIUM 20 MG PO TABS: 20.0000 mg | ORAL_TABLET | Freq: Every day | ORAL | Status: DC

## 2012-09-28 NOTE — Telephone Encounter (Signed)
Discussed lab results w/pt and she agreed to try the pravastatin 20 mg. She reported that she has tried both the simvastatin and lipitor in past and both caused her muscle aches. Sent 90 day Rx to [facility] order per pt request (instead of #30 w/2 RFs.).   I advised pt we will be back in touch w/pt after we have info about her compounded pain cream Rx. Pt agreed and stated that if it costs too much she will not want to get it.

## 2012-09-29 NOTE — Telephone Encounter (Signed)
Dr Clelia Croft brought me order form and I faxed it to [facility] (w/confirmation) at [phone] w/note to call pt before filling w/cost. I also called before faxing to make sure that they will call pt first and was told they routinely do this anyway. Notified pt that this was done.

## 2012-09-29 NOTE — Telephone Encounter (Signed)
Great. Form completed to fax back to their compounding pharmacy for #[phone] [id number]. Hopefully the pharmacy can call the pt with the cost info before they make it for her.

## 2012-09-29 NOTE — Telephone Encounter (Signed)
Dr Clelia Croft, Corrie Dandy from [facility] dropped off more order forms and info "cheat sheet" for which medication combinations are best for what types of pain. The info packet and forms are in info file drawer at TL desk.

## 2012-12-18 ENCOUNTER — Ambulatory Visit: Payer: Medicare Other

## 2012-12-18 ENCOUNTER — Ambulatory Visit (INDEPENDENT_AMBULATORY_CARE_PROVIDER_SITE_OTHER): Payer: Medicare Other | Admitting: Family Medicine

## 2012-12-18 ENCOUNTER — Encounter: Payer: Self-pay | Admitting: Family Medicine

## 2012-12-18 VITALS — BP 125/81 | HR 81 | Temp 97.4°F | Resp 16 | Ht 65.0 in | Wt 184.0 lb

## 2012-12-18 DIAGNOSIS — M79609 Pain in unspecified limb: Secondary | ICD-10-CM

## 2012-12-18 DIAGNOSIS — M79641 Pain in right hand: Secondary | ICD-10-CM

## 2012-12-18 DIAGNOSIS — M5412 Radiculopathy, cervical region: Secondary | ICD-10-CM

## 2012-12-18 DIAGNOSIS — Z79899 Other long term (current) drug therapy: Secondary | ICD-10-CM

## 2012-12-18 DIAGNOSIS — M5416 Radiculopathy, lumbar region: Secondary | ICD-10-CM

## 2012-12-18 DIAGNOSIS — E785 Hyperlipidemia, unspecified: Secondary | ICD-10-CM

## 2012-12-18 LAB — HEPATIC FUNCTION PANEL
ALT: 17 U/L (ref 0–35)
AST: 18 U/L (ref 0–37)
Albumin: 4.5 g/dL (ref 3.5–5.2)
Bilirubin, Direct: 0.1 mg/dL (ref 0.0–0.3)
Total Protein: 7 g/dL (ref 6.0–8.3)

## 2012-12-18 MED ORDER — GABAPENTIN 100 MG PO CAPS: 100.0000 mg | ORAL_CAPSULE | Freq: Every day | ORAL | Status: DC

## 2012-12-18 MED ORDER — PRAVASTATIN SODIUM 20 MG PO TABS: 20.0000 mg | ORAL_TABLET | Freq: Every day | ORAL | Status: DC

## 2012-12-18 NOTE — Progress Notes (Addendum)
Subjective:    Patient ID: Ardyth Man, female    DOB: [birth year], 67 y.o.   MRN: [id number] Chief Complaint  Patient presents with  . medication check and refills    gabapentin, pravastatin  . Hand Pain    fell one month ago    HPI  We started pt on a topical pain cream from [facility] - she is using it on her back, neck. Diclof/bacl/bupiv/cycl/gaba 5/2/1/2/6%. One bottle is lasting her a long time and it is working well for her.  Lidocaine patches work but won't stick onto neck so cream works better. Still has some lidocaine patches but doesn't use as often since she is using the cream. Gabapentin works great but she tried it during the day but hands to stiff and achy. It relieves her feet burning at night.  Ms. Farabee fell last month and slipped backwards and landed on right hip but hit her right thumb as well and since then she has lost mobility, can't grasp, is still very painful in the right 1st MCP joint but not swollen and bruising is gone.  Also c/o fullness in her submandibular area - wonders if she could have salivary gland stones. Past Medical History  Diagnosis Date  . Arthritis   . Cancer   . Neuromuscular disorder   . Cataract   . Hypertension    Current Outpatient Prescriptions on File Prior to Visit  Medication Sig Dispense Refill  . b complex vitamins tablet Take 1 tablet by mouth daily.      . cholecalciferol (VITAMIN D) 1000 UNITS tablet Take 1,000 Units by mouth 2 (two) times daily.      Marland Kitchen lidocaine (LIDODERM) 5 % Place 1 patch onto the skin daily. Remove & Discard patch within 12 hours or as directed by MD      . lisinopril (PRINIVIL,ZESTRIL) 10 MG tablet Take 1 tablet (10 mg total) by mouth daily.  90 tablet  3  . meloxicam (MOBIC) 15 MG tablet Take 1 tablet (15 mg total) by mouth daily.  90 tablet  3  . Omega-3 Fatty Acids (FISH OIL) 1000 MG CAPS Take 1,000 mg by mouth 2 (two) times daily.      . Coenzyme Q10 (COQ10) 200 MG CAPS Take by mouth at bedtime.      .  NON FORMULARY 1,200 mg 2 (two) times daily.      . Red Yeast Rice Extract (RED YEAST RICE PO) Take 1,200 mg by mouth daily.       No current facility-administered medications on file prior to visit.   Allergies  Allergen Reactions  . Codeine Other (See Comments)    DIZZINESS    Review of Systems  Constitutional: Negative for fever, chills, activity change, appetite change and unexpected weight change.  HENT: Positive for neck stiffness and ear discharge.   Gastrointestinal: Negative for nausea, vomiting and abdominal distention.  Musculoskeletal: Positive for myalgias, back pain, joint swelling, arthralgias and gait problem.  Skin: Negative for rash.  Neurological: Positive for weakness and numbness.  Psychiatric/Behavioral: Positive for sleep disturbance.      BP 125/81  Pulse 81  Temp(Src) 97.4 F (36.3 C) (Oral)  Resp 16  Ht 5\' 5"  (1.651 m)  Wt 184 lb (83.462 kg)  BMI 30.62 kg/m2 Objective:   Physical Exam  Constitutional: She is oriented to person, place, and time. She appears well-developed and well-nourished. No distress.  HENT:  Head: Normocephalic and atraumatic.  Right Ear: External ear normal.  Left Ear: External ear normal.  Eyes: Conjunctivae are normal. No scleral icterus.  Neck: Normal range of motion. Neck supple. No thyromegaly present.  Cardiovascular: Normal rate, regular rhythm, normal heart sounds and intact distal pulses.   Pulmonary/Chest: Effort normal and breath sounds normal. No respiratory distress.  Musculoskeletal: She exhibits no edema.       Right hip: Normal. She exhibits normal range of motion, normal strength, no tenderness and no bony tenderness.       Right hand: She exhibits decreased range of motion, tenderness and bony tenderness. She exhibits normal capillary refill, no deformity and no swelling. Normal sensation noted. Decreased strength noted. She exhibits finger abduction and thumb/finger opposition. She exhibits no wrist extension  trouble.       Hands: Tender at right 1st MTP with thumb weakness  Lymphadenopathy:    She has no cervical adenopathy.  Neurological: She is alert and oriented to person, place, and time.  Skin: Skin is warm and dry. She is not diaphoretic. No erythema.  Psychiatric: She has a normal mood and affect. Her behavior is normal.     [facility] reading (PRIMARY) by  Dr. Clelia Croft: Small non-displaced fracture of distal end of 1st IP joint Radiology overread: Degenerative changes in the DIP joints, particularly the index  finger.  Small bone fragment or ossification at the thumb interphalangeal  joint. This could represent a chronic finding and recommend  clinical correlation in this area.  Assessment & Plan:  Encounter for long-term (current) use of other medications - Plan: Hepatic Function Panel  Other and unspecified hyperlipidemia - Plan: Hepatic Function Panel, pravastatin (PRAVACHOL) 20 MG tablet - check alt since started pravachol about 6 wks prev for LDL 160 (goal <130). Rec repeat lipid panel in 4 mos at next OV  Right hand pain - Plan: DG Hand Complete Right - placed in thumb spica splint for possible fracture (though on over-read does not appear to be.)  RICE x 2-4 wks and the f/u. If no improvement, refer to hand surgery.  Cervical radiculopathy - Cont topical cream from [facility] -  Diclof/bacl/bupiv/cycl/gaba 5/2/1/2/6%  Lumbar radiculopathy, chronic - Plan: gabapentin (NEURONTIN) 100 MG capsule  Salivary gland stones - no abnormality seen on exam. Pt advised to do lots of sour food and hard candies (stimulate saliva production and sucking manuvers) to move out any potential stones or blockages. RTC or to dentist if now improvement in the next few days.  Meds ordered this encounter  Medications  . gabapentin (NEURONTIN) 100 MG capsule    Sig: Take 1 capsule (100 mg total) by mouth at bedtime.    Dispense:  90 capsule    Refill:  3  . pravastatin (PRAVACHOL) 20 MG tablet    Sig: Take 1  tablet (20 mg total) by mouth daily. , at night.    Dispense:  90 tablet    Refill:  1

## 2013-01-04 ENCOUNTER — Ambulatory Visit (INDEPENDENT_AMBULATORY_CARE_PROVIDER_SITE_OTHER): Payer: Medicare Other

## 2013-01-06 ENCOUNTER — Telehealth: Payer: Self-pay

## 2013-01-06 NOTE — Telephone Encounter (Signed)
Request given to xray 

## 2013-01-06 NOTE — Telephone Encounter (Signed)
Pt is needing copy of xrays of right thumb  Please call [phone] when ready to pick up

## 2013-01-11 ENCOUNTER — Other Ambulatory Visit: Payer: Self-pay | Admitting: Orthopedic Surgery

## 2013-01-11 DIAGNOSIS — M79644 Pain in right finger(s): Secondary | ICD-10-CM

## 2013-01-18 ENCOUNTER — Other Ambulatory Visit: Payer: Self-pay | Admitting: Orthopedic Surgery

## 2013-01-18 ENCOUNTER — Ambulatory Visit
Admission: RE | Admit: 2013-01-18 | Discharge: 2013-01-18 | Disposition: A | Discharge status: Home / Self Care | Payer: Medicare Other | Source: Ambulatory Visit | Attending: Orthopedic Surgery | Admitting: Orthopedic Surgery

## 2013-01-18 DIAGNOSIS — M79644 Pain in right finger(s): Secondary | ICD-10-CM

## 2013-01-18 MED ORDER — IOHEXOL 180 MG/ML  SOLN: 1.0000 mL | Freq: Once | INTRAMUSCULAR | Status: DC | PRN

## 2013-01-18 MED ORDER — IOHEXOL 180 MG/ML  SOLN
2.0000 mL | Freq: Once | INTRAMUSCULAR | Status: AC | PRN
  Administered 2013-01-18: 2 mL via INTRA_ARTICULAR

## 2013-02-02 ENCOUNTER — Encounter: Payer: Self-pay | Admitting: Family Medicine

## 2013-02-02 DIAGNOSIS — M259 Joint disorder, unspecified: Secondary | ICD-10-CM | POA: Insufficient documentation

## 2013-02-02 DIAGNOSIS — S66801A Unspecified injury of other specified muscles, fascia and tendons at wrist and hand level, right hand, initial encounter: Secondary | ICD-10-CM

## 2013-02-02 DIAGNOSIS — S66809A Unspecified injury of other specified muscles, fascia and tendons at wrist and hand level, unspecified hand, initial encounter: Secondary | ICD-10-CM | POA: Insufficient documentation

## 2013-02-03 ENCOUNTER — Encounter: Payer: Self-pay | Admitting: Family Medicine

## 2013-03-18 ENCOUNTER — Encounter: Payer: Self-pay | Admitting: Emergency Medicine

## 2013-05-26 ENCOUNTER — Other Ambulatory Visit: Payer: Self-pay

## 2013-08-26 ENCOUNTER — Other Ambulatory Visit: Payer: Self-pay

## 2013-09-29 ENCOUNTER — Other Ambulatory Visit: Payer: Self-pay

## 2013-10-13 ENCOUNTER — Telehealth: Payer: Self-pay | Admitting: Family Medicine

## 2013-10-13 NOTE — Telephone Encounter (Signed)
[name] calls the answering service. She feel about an hour ago, does not know what happened. Hyperextended her knee out in front of her. Her knee is swollen and painful, she has it elevated and is doing RICE but wants to know how long she is safe to wait before clinical evaluation since it is Christmas Eve.  Pt advised that she can come into clinic tomorrow - we open by 1. For now she is able to ambulate w/ a cane so is ok to remain home o/n prior to eval.

## 2013-10-14 ENCOUNTER — Ambulatory Visit (INDEPENDENT_AMBULATORY_CARE_PROVIDER_SITE_OTHER): Payer: Medicare Other | Admitting: Family Medicine

## 2013-10-14 ENCOUNTER — Ambulatory Visit: Payer: Medicare Other

## 2013-10-14 VITALS — BP 110/78 | HR 91 | Temp 98.4°F | Resp 18 | Ht 66.5 in | Wt 188.2 lb

## 2013-10-14 DIAGNOSIS — T148XXA Other injury of unspecified body region, initial encounter: Secondary | ICD-10-CM

## 2013-10-14 DIAGNOSIS — S8991XA Unspecified injury of right lower leg, initial encounter: Secondary | ICD-10-CM

## 2013-10-14 DIAGNOSIS — IMO0001 Reserved for inherently not codable concepts without codable children: Secondary | ICD-10-CM

## 2013-10-14 DIAGNOSIS — S8992XS Unspecified injury of left lower leg, sequela: Secondary | ICD-10-CM

## 2013-10-14 DIAGNOSIS — S8990XA Unspecified injury of unspecified lower leg, initial encounter: Secondary | ICD-10-CM

## 2013-10-14 NOTE — Progress Notes (Signed)
Chief Complaint:  Chief Complaint  Patient presents with  . Fall    Patient fell yesterday around 3:30. Right knee pain.    HPI: [name] is a 67 y.o. female who is here for right knee pain s/p fall yesterday at 3:30 , was going down ramp.  She slipped and twiseted her right knee. No prior injuries. + swelling, + pain with twisting, no pain with rest. + ache.  Denies osteopenia, osteoporosis She denis being dizzy or having CP, palpitations or SOB   Past Medical History  Diagnosis Date  . Arthritis   . Cancer   . Cataract   . Hypertension   . Injury of ligament of hand     right thumb ulnar collateral ligament injury.  recommend MR arthrogram of the thumb.  Marland Kitchen Neuromuscular disorder     scoliosis s/p spine surgery, has arthritis and pinched nerves   Past Surgical History  Procedure Laterality Date  . Breast surgery  2006    left  . Eye surgery  2012  . Spine surgery  1981 and 2007  . Abdominal hysterectomy  2002    have ovaries   History   Social History  . Marital Status: Married    Spouse Name: N/A    Number of Children: N/A  . Years of Education: N/A   Social History Main Topics  . Smoking status: Never Smoker   . Smokeless tobacco: None  . Alcohol Use: No  . Drug Use: No  . Sexual Activity: Yes   Other Topics Concern  . None   Social History Narrative   Exercise swimming 3 times/wk and silver sneaker 2 times/wk for 1 hour   Family History  Problem Relation Age of Onset  . Diabetes Mother     age early 77's  . Heart disease Mother     age early 50's or 40's  . Stroke Father 56    per pt mini  . Diabetes Sister 57    type II  . Kidney disease Brother     polycystic kidney  . Alcohol abuse Maternal Grandfather    Allergies  Allergen Reactions  . Codeine Other (See Comments)    DIZZINESS   Prior to Admission medications   Medication Sig Start Date End Date Taking? Authorizing Provider  b complex vitamins tablet Take 1 tablet by  mouth daily.   Yes Historical Provider, MD  cholecalciferol (VITAMIN D) 1000 UNITS tablet Take 1,000 Units by mouth 2 (two) times daily.   Yes Historical Provider, MD  gabapentin (NEURONTIN) 100 MG capsule Take 1 capsule (100 mg total) by mouth at bedtime. 12/18/12  Yes Sherren Mocha, MD  lidocaine (LIDODERM) 5 % Place 1 patch onto the skin daily. Remove & Discard patch within 12 hours or as directed by MD   Yes Historical Provider, MD  lisinopril (PRINIVIL,ZESTRIL) 10 MG tablet Take 1 tablet (10 mg total) by mouth daily. 09/18/12  Yes Sherren Mocha, MD  meloxicam (MOBIC) 15 MG tablet Take 1 tablet (15 mg total) by mouth daily. 09/18/12  Yes Sherren Mocha, MD  Omega-3 Fatty Acids (FISH OIL) 1000 MG CAPS Take 1,000 mg by mouth 2 (two) times daily.   Yes Historical Provider, MD  pravastatin (PRAVACHOL) 20 MG tablet Take 1 tablet (20 mg total) by mouth daily. , at night. 12/18/12  Yes Sherren Mocha, MD  Coenzyme Q10 (COQ10) 200 MG CAPS Take by mouth at bedtime.  Historical Provider, MD  NON FORMULARY 1,200 mg 2 (two) times daily.    Historical Provider, MD  Red Yeast Rice Extract (RED YEAST RICE PO) Take 1,200 mg by mouth daily.    Historical Provider, MD     ROS: The patient denies fevers, chills, night sweats, unintentional weight loss, chest pain, palpitations, wheezing, dyspnea on exertion, nausea, vomiting, abdominal pain, dysuria, hematuria, melena, numbness, weakness, or tingling.   All other systems have been reviewed and were otherwise negative with the exception of those mentioned in the HPI and as above.    PHYSICAL EXAM: Filed Vitals:   10/14/13 1201  BP: 110/78  Pulse: 91  Temp: 98.4 F (36.9 C)  Resp: 18   Filed Vitals:   10/14/13 1201  Height: 5' 6.5" (1.689 m)  Weight: 188 lb 3.2 oz (85.367 kg)   Body mass index is 29.92 kg/(m^2).  General: Alert, no acute distress HEENT:  Normocephalic, atraumatic, oropharynx patent. EOMI, PERRLA Cardiovascular:  Regular rate and rhythm, no rubs  murmurs or gallops.  No Carotid bruits, radial pulse intact. No pedal edema.  Respiratory: Clear to auscultation bilaterally.  No wheezes, rales, or rhonchi.  No cyanosis, no use of accessory musculature GI: No organomegaly, abdomen is soft and non-tender, positive bowel sounds.  No masses. Skin: No rashes. Neurologic: Facial musculature symmetric. Psychiatric: Patient is appropriate throughout our interaction. Lymphatic: No cervical lymphadenopathy Musculoskeletal: Gait intact. + swelling right knee, neg lachman, +/- jt line tenderness,  + pain with MCL and LCL varus and valgus stress + bruise on left knee   LABS: Results for orders placed in visit on 12/18/12  HEPATIC FUNCTION PANEL      Result Value Range   Total Bilirubin 0.6  0.3 - 1.2 mg/dL   Bilirubin, Direct 0.1  0.0 - 0.3 mg/dL   Indirect Bilirubin 0.5  0.0 - 0.9 mg/dL   Alkaline Phosphatase 45  39 - 117 U/L   AST 18  0 - 37 U/L   ALT 17  0 - 35 U/L   Total Protein 7.0  6.0 - 8.3 g/dL   Albumin 4.5  3.5 - 5.2 g/dL     EKG/XRAY:   Primary read interpreted by Dr. Conley Rolls at [facility]. No fx or dislocation pf bilaterally   ASSESSMENT/PLAN: Encounter Diagnoses  Name Primary?  . Knee injury, right, initial encounter Yes  . Knee injury, left, sequela    Continue with mobic for pain, she is on it for arthritis F/u prn  Gross sideeffects, risk and benefits, and alternatives of medications d/w patient. Patient is aware that all medications have potential sideeffects and we are unable to predict every sideeffect or drug-drug interaction that may occur.  Hamilton Capri PHUONG, DO 10/14/2013 12:52 PM

## 2013-12-27 ENCOUNTER — Ambulatory Visit (INDEPENDENT_AMBULATORY_CARE_PROVIDER_SITE_OTHER): Payer: Managed Care, Other (non HMO) | Admitting: Family Medicine

## 2013-12-27 ENCOUNTER — Encounter: Payer: Self-pay | Admitting: Family Medicine

## 2013-12-27 VITALS — BP 129/86 | HR 91 | Temp 98.9°F | Resp 16 | Ht 65.5 in | Wt 182.0 lb

## 2013-12-27 DIAGNOSIS — E785 Hyperlipidemia, unspecified: Secondary | ICD-10-CM

## 2013-12-27 DIAGNOSIS — M47812 Spondylosis without myelopathy or radiculopathy, cervical region: Secondary | ICD-10-CM

## 2013-12-27 DIAGNOSIS — I10 Essential (primary) hypertension: Secondary | ICD-10-CM

## 2013-12-27 DIAGNOSIS — M5416 Radiculopathy, lumbar region: Secondary | ICD-10-CM

## 2013-12-27 DIAGNOSIS — Z853 Personal history of malignant neoplasm of breast: Secondary | ICD-10-CM | POA: Insufficient documentation

## 2013-12-27 DIAGNOSIS — Z0000 Encounter for general adult medical examination without abnormal findings: Secondary | ICD-10-CM

## 2013-12-27 DIAGNOSIS — M549 Dorsalgia, unspecified: Secondary | ICD-10-CM

## 2013-12-27 DIAGNOSIS — IMO0002 Reserved for concepts with insufficient information to code with codable children: Secondary | ICD-10-CM

## 2013-12-27 DIAGNOSIS — C50919 Malignant neoplasm of unspecified site of unspecified female breast: Secondary | ICD-10-CM

## 2013-12-27 LAB — CBC
HCT: 39.4 % (ref 36.0–46.0)
Hemoglobin: 13.2 g/dL (ref 12.0–15.0)
MCH: 30.4 pg (ref 26.0–34.0)
MCHC: 33.5 g/dL (ref 30.0–36.0)
MCV: 90.8 fL (ref 78.0–100.0)
PLATELETS: 220 10*3/uL (ref 150–400)
RBC: 4.34 MIL/uL (ref 3.87–5.11)
RDW: 13.2 % (ref 11.5–15.5)
WBC: 5.3 10*3/uL (ref 4.0–10.5)

## 2013-12-27 LAB — COMPREHENSIVE METABOLIC PANEL
ALT: 18 U/L (ref 0–35)
AST: 18 U/L (ref 0–37)
Albumin: 4.6 g/dL (ref 3.5–5.2)
Alkaline Phosphatase: 50 U/L (ref 39–117)
BUN: 13 mg/dL (ref 6–23)
CALCIUM: 9.9 mg/dL (ref 8.4–10.5)
CHLORIDE: 104 meq/L (ref 96–112)
CO2: 26 meq/L (ref 19–32)
CREATININE: 0.93 mg/dL (ref 0.50–1.10)
Glucose, Bld: 107 mg/dL — ABNORMAL HIGH (ref 70–99)
Potassium: 4.4 mEq/L (ref 3.5–5.3)
Sodium: 139 mEq/L (ref 135–145)
TOTAL PROTEIN: 7.2 g/dL (ref 6.0–8.3)
Total Bilirubin: 0.6 mg/dL (ref 0.2–1.2)

## 2013-12-27 LAB — TSH: TSH: 1.264 u[IU]/mL (ref 0.350–4.500)

## 2013-12-27 LAB — LIPID PANEL
Cholesterol: 212 mg/dL — ABNORMAL HIGH (ref 0–200)
HDL: 47 mg/dL (ref >39)
LDL Cholesterol: 127 mg/dL — ABNORMAL HIGH (ref 0–99)
Total CHOL/HDL Ratio: 4.5 Ratio
Triglycerides: 192 mg/dL — ABNORMAL HIGH (ref <150)
VLDL: 38 mg/dL (ref 0–40)

## 2013-12-27 MED ORDER — GABAPENTIN 100 MG PO CAPS: 100.0000 mg | ORAL_CAPSULE | Freq: Three times a day (TID) | ORAL | Status: DC

## 2013-12-27 MED ORDER — PRAVASTATIN SODIUM 20 MG PO TABS: 20.0000 mg | ORAL_TABLET | Freq: Every day | ORAL | Status: DC

## 2013-12-27 MED ORDER — LIDOCAINE 5 % EX PTCH: 1.0000 | MEDICATED_PATCH | CUTANEOUS | Status: DC

## 2013-12-27 MED ORDER — MELOXICAM 15 MG PO TABS: 15.0000 mg | ORAL_TABLET | Freq: Every day | ORAL | Status: DC

## 2013-12-27 MED ORDER — LISINOPRIL 10 MG PO TABS: 10.0000 mg | ORAL_TABLET | Freq: Every day | ORAL | Status: DC

## 2013-12-27 NOTE — Progress Notes (Signed)
[facility] [address], [place] [address] [phone]  Date:  12/27/2013   Name:  [name]   DOB:  10-31-1945   MRN:  [id number]  PCP:  Jenny Reichmann, MD    Chief Complaint: Annual Exam   History of Present Illness:  [name] is a 68 y.o. very pleasant female patient who presents with the following:  Here today for a CPE.  History of spine surgery, HTN, high cholesterol History of hysterectomy 2002.  She thinks her cervix was removed.  She does take Pravachol for her cholesterol, lisinopril for HTN  She is fasting today for labs Non- smoker, non- drinker, no drugs. She is SA with her husband.  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    She did have one fall in the last year; she slipped on a wet walkway and sprained her right knee.  She is still wearing a brace for this when she exercises but overall is better.  Negative depression screen.    She hs noted a itchy throat and runny nose since yesterday.  "I'm just getting a cold."  He has had a couple of spine operations- 1981 and 2007, Dr. Patrice Paradise is her NSG. "all of my lumbar discs are fused."  Her lower back is now better, but she still has pain in her upper back and cervical spine.   Mobic is helpful for her- she has been taking it for 10 years. She takes neurontin at night for burning in her foot due to a pinched nerve Mammogram is UTD- she did have breast cancer in 2006 and is in remission  Colonoscopy done per [facility] in 2006  BP 10/14/14: 110/78, P 91 BPm  Patient Active Problem List   Diagnosis Date Noted  . Injury of ulnar collateral ligament of wrist 02/02/2013  . Hypertension 09/25/2012  . Lumbar radiculopathy, chronic 09/25/2012  . Cervical radiculopathy 09/25/2012    Past Medical History  Diagnosis Date  . Arthritis   . Cancer   . Cataract   . Hypertension   . Injury of ligament of hand     right thumb ulnar collateral ligament injury.  recommend  MR arthrogram of the thumb.  Marland Kitchen Neuromuscular disorder     scoliosis s/p spine surgery, has arthritis and pinched nerves    Past Surgical History  Procedure Laterality Date  . Breast surgery  2006    left  . Eye surgery  2012  . Spine surgery  1981 and 2007  . Abdominal hysterectomy  2002    have ovaries    History  Substance Use Topics  . Smoking status: Never Smoker   . Smokeless tobacco: Not on file  . Alcohol Use: No    Family History  Problem Relation Age of Onset  . Diabetes Mother     age early 28's  . Heart disease Mother     age early 26's or 58's  . Stroke Father 6    per pt mini  . Diabetes Sister 78    type II  . Kidney disease Brother     polycystic kidney  . Alcohol abuse Maternal Grandfather     Allergies  Allergen Reactions  . Codeine Other (See Comments)    DIZZINESS    Medication list has been reviewed and updated.  Current Outpatient Prescriptions on File Prior to Visit  Medication Sig Dispense Refill  . b complex vitamins tablet Take 1  tablet by mouth daily.      . cholecalciferol (VITAMIN D) 1000 UNITS tablet Take 1,000 Units by mouth 2 (two) times daily.      Marland Kitchen gabapentin (NEURONTIN) 100 MG capsule Take 1 capsule (100 mg total) by mouth at bedtime.  90 capsule  3  . lidocaine (LIDODERM) 5 % Place 1 patch onto the skin daily. Remove & Discard patch within 12 hours or as directed by MD      . lisinopril (PRINIVIL,ZESTRIL) 10 MG tablet Take 1 tablet (10 mg total) by mouth daily.  90 tablet  3  . meloxicam (MOBIC) 15 MG tablet Take 1 tablet (15 mg total) by mouth daily.  90 tablet  3  . Omega-3 Fatty Acids (FISH OIL) 1000 MG CAPS Take 1,000 mg by mouth 2 (two) times daily.      . pravastatin (PRAVACHOL) 20 MG tablet Take 1 tablet (20 mg total) by mouth daily. , at night.  90 tablet  1   No current facility-administered medications on file prior to visit.    Review of Systems:  As per HPI- otherwise negative.   Physical  Examination: Filed Vitals:   12/27/13 1009  BP: 129/86  Pulse: 91  Temp: 98.9 F (37.2 C)  Resp: 16   Filed Vitals:   12/27/13 1009  Height: 5' 5.5" (1.664 m)  Weight: 182 lb (82.555 kg)   Body mass index is 29.82 kg/(m^2). Ideal Body Weight: Weight in (lb) to have BMI = 25: 152.2  GEN: WDWN, NAD, Non-toxic, A & O x 3, looks well, overweight HEENT: Atraumatic, Normocephalic. Neck supple. No masses, No LAD.  Bilateral TM wnl, oropharynx normal.  PEERL,EOMI.  Ears and Nose: No external deformity. CV: RRR, No M/G/R. No JVD. No thrill. No extra heart sounds. PULM: CTA B, no wheezes, crackles, rhonchi. No retractions. No resp. distress. No accessory muscle use. ABD: S, NT, ND, +BS. No rebound. No HSM. EXTR: No c/c/e NEURO Normal gait.  PSYCH: Normally interactive. Conversant. Not depressed or anxious appearing.  Calm demeanor.  Breast: normal exam, no masses/ discharge or dimpling GU: normal post- hyterectomy exam.  No adnexal masses or tenderness.  NO cervix present  Assessment and Plan: Physical exam - Plan: CBC, Comprehensive metabolic panel, TSH  Other and unspecified hyperlipidemia - Plan: pravastatin (PRAVACHOL) 20 MG tablet, Lipid panel  Hypertension - Plan: lisinopril (PRINIVIL,ZESTRIL) 10 MG tablet  Cervical arthritis - Plan: meloxicam (MOBIC) 15 MG tablet  Lumbar radiculopathy, chronic - Plan: gabapentin (NEURONTIN) 100 MG capsule  Back pain - Plan: lidocaine (LIDODERM) 5 %  Breast cancer  Medication refills and labs as above.  She is UTD on all her vaccinations and screening tests   Signed Lamar Blinks, MD

## 2013-12-27 NOTE — Patient Instructions (Addendum)
Great to see you today!  I will release your labs to mychart when they come in  Let me know if your cold is getting worse, or if you have any other issues with your knee Don't forget your colonoscopy is due next year

## 2014-01-19 ENCOUNTER — Ambulatory Visit: Payer: Managed Care, Other (non HMO)

## 2014-01-19 ENCOUNTER — Ambulatory Visit (INDEPENDENT_AMBULATORY_CARE_PROVIDER_SITE_OTHER): Payer: Managed Care, Other (non HMO) | Admitting: Family Medicine

## 2014-01-19 VITALS — BP 116/78 | HR 84 | Temp 98.2°F | Resp 18 | Ht 65.5 in | Wt 183.0 lb

## 2014-01-19 DIAGNOSIS — R05 Cough: Secondary | ICD-10-CM

## 2014-01-19 DIAGNOSIS — R059 Cough, unspecified: Secondary | ICD-10-CM

## 2014-01-19 DIAGNOSIS — J209 Acute bronchitis, unspecified: Secondary | ICD-10-CM

## 2014-01-19 MED ORDER — AZITHROMYCIN 250 MG PO TABS: ORAL_TABLET | ORAL | Status: DC

## 2014-01-19 MED ORDER — HYDROCODONE-HOMATROPINE 5-1.5 MG/5ML PO SYRP: 5.0000 mL | ORAL_SOLUTION | Freq: Three times a day (TID) | ORAL | Status: DC | PRN

## 2014-01-19 NOTE — Progress Notes (Signed)
° °  Subjective:    Patient ID: [name], female    DOB: 07/26/1946, 68 y.o.   MRN: [id number]  HPI  DESIRAYE ROLFSON is a 68 y.o. female who presents to office complaining of a constant, productive cough with associated wheezing that began three weeks ago. She describes the pain for the cough as a burning sensation in her chest. She states that the cough started out with a sore throat, congestion and rhinorrhea. She was seen by Dr. Lorelei Pont for the symptoms on 3/9 and states that everything subsided except for the cough. She initially had a fever for three days at the onset of the symptoms but has not had one since. As of two days ago, the sore throat and congestion returned.  She reports a past medical history of breast cancer. She has been cancer free for eight years.   Review of Systems Review of Systems: Consitutional: No fever, chills, fatigue, night sweats, lymphadenopathy, or weight changes. Eyes: No visual changes, eye redness, or discharge. ENT/Mouth: Ears: No otalgia, tinnitus, hearing loss, discharge. Nose: Positive for congestion, rhinorrhea. No sinus pain, or epistaxis. Throat: Positive for sore throat. No post nasal drip, or teeth pain. Cardiovascular: No CP, palpitations, diaphoresis, DOE, edema, orthopnea, PND. Respiratory: Positive for cough, wheezing. No hemoptysis, SOB Gastrointestinal: No anorexia, dysphagia, reflux, pain, nausea, vomiting, hematemesis, diarrhea, constipation, BRBPR, or melena. Genitourinary: No dysuria, frequency, urgency, hematuria, incontinence, nocturia, decreased urinary stream, discharge, impotence, or testicular pain/masses. Musculoskeletal: No decreased ROM, myalgias, stiffness, joint swelling, or weakness. Skin: No rash, erythema, lesion changes, pain, warmth, jaundice, or pruritis. Neurological: No headache, dizziness, syncope, seizures, tremors, memory loss, coordination problems, or paresthesias. Psychological: No anxiety, depression,  hallucinations, SI/HI. Endocrine: No fatigue, polydipsia, polyphagia or polyuria. All other systems were reviewed and are otherwise negative.      Objective:   Physical Exam General: Well-developed, well-nourished female in no acute distress; appearance consistent with age of record HENT: normocephalic; atraumatic Eyes: pupils equal, round and reactive to light; extraocular muscles intact Neck: supple Heart: regular rate and rhythm; no murmurs, rubs or gallops Lungs: bilateral rales; deep expiratory rhonchi on the right Abdomen: soft; nondistended; nontender; no masses or hepatosplenomegaly; bowel sounds present Extremities: No deformity; full range of motion; pulses normal Neurologic: Awake, alert and oriented; motor function intact in all extremities and symmetric; no facial droop Skin: Warm and dry Psychiatric: Normal mood and affect  [facility] reading (PRIMARY) by  Dr. Joseph Art CXR without infiltrate or cardiomegaly.       Assessment & Plan:  Cough - Plan: DG Chest 2 View, azithromycin (ZITHROMAX Z-PAK) 250 MG tablet, HYDROcodone-homatropine (HYCODAN) 5-1.5 MG/5ML syrup  Acute bronchitis - Plan: azithromycin (ZITHROMAX Z-PAK) 250 MG tablet  Signed, Robyn Haber, MD    I personally performed the services described in this documentation, which was scribed in my presence. The recorded information has been reviewed and is accurate.

## 2014-01-19 NOTE — Patient Instructions (Signed)

## 2014-03-02 ENCOUNTER — Encounter: Payer: Self-pay | Admitting: Emergency Medicine

## 2014-03-03 ENCOUNTER — Telehealth: Payer: Self-pay

## 2014-03-03 DIAGNOSIS — M5136 Other intervertebral disc degeneration, lumbar region: Secondary | ICD-10-CM

## 2014-03-03 NOTE — Telephone Encounter (Signed)
Patient request to be referred to a neurosurgeon for her back. Name of the doctor( Dr.Kilpatrick ) [phone]

## 2014-03-04 NOTE — Telephone Encounter (Signed)
Dr. Lorelei Pont saw pt in March in regards to Chronic lumbar/back pain during CPE. Can we send in this referral?

## 2014-03-31 ENCOUNTER — Other Ambulatory Visit: Payer: Self-pay | Admitting: Neurosurgery

## 2014-03-31 DIAGNOSIS — M48061 Spinal stenosis, lumbar region without neurogenic claudication: Secondary | ICD-10-CM

## 2014-03-31 DIAGNOSIS — IMO0002 Reserved for concepts with insufficient information to code with codable children: Secondary | ICD-10-CM

## 2014-04-02 ENCOUNTER — Ambulatory Visit
Admission: RE | Admit: 2014-04-02 | Discharge: 2014-04-02 | Disposition: A | Discharge status: Home / Self Care | Payer: Medicare HMO | Source: Ambulatory Visit | Attending: Neurosurgery | Admitting: Neurosurgery

## 2014-04-02 DIAGNOSIS — IMO0002 Reserved for concepts with insufficient information to code with codable children: Secondary | ICD-10-CM

## 2014-04-02 DIAGNOSIS — M48061 Spinal stenosis, lumbar region without neurogenic claudication: Secondary | ICD-10-CM

## 2014-04-05 ENCOUNTER — Ambulatory Visit (INDEPENDENT_AMBULATORY_CARE_PROVIDER_SITE_OTHER): Payer: Medicare HMO | Admitting: Internal Medicine

## 2014-04-05 VITALS — BP 130/88 | HR 106 | Temp 98.7°F | Resp 16 | Ht 65.5 in | Wt 176.6 lb

## 2014-04-05 DIAGNOSIS — L299 Pruritus, unspecified: Secondary | ICD-10-CM

## 2014-04-05 DIAGNOSIS — L259 Unspecified contact dermatitis, unspecified cause: Secondary | ICD-10-CM

## 2014-04-05 MED ORDER — CLOBETASOL PROPIONATE 0.05 % EX OINT: 1.0000 "application " | TOPICAL_OINTMENT | Freq: Two times a day (BID) | CUTANEOUS | Status: DC

## 2014-04-05 NOTE — Progress Notes (Signed)
   Subjective:    Patient ID: [name], female    DOB: 1946/05/19, 68 y.o.   MRN: [id number]  HPI CO rash on neck and spreading,, very itchy, stings a little worried about shingles. Rash is only on neck symmetric, sun exposed areas.No fever,fatigue. Feels fine otherwise   Review of Systems     Objective:   Physical Exam  Vitals reviewed. Constitutional: She is oriented to person, place, and time. She appears well-developed and well-nourished. No distress.  HENT:  Head: Normocephalic.  Neck: Normal range of motion. No muscular tenderness present. Erythema present. No edema present. No mass present.    Itchy maculopapular erythematous rash No vesicles  Pulmonary/Chest: Effort normal.  Musculoskeletal: Normal range of motion.  Neurological: She is alert and oriented to person, place, and time. No cranial nerve deficit.  Skin: Rash noted.  Psychiatric: She has a normal mood and affect.          Assessment & Plan:  Contact Dermatitis/cause unclear Clobetasol ointment Zyrtec/Benedryl

## 2014-04-05 NOTE — Patient Instructions (Signed)

## 2014-05-02 ENCOUNTER — Telehealth: Payer: Self-pay | Admitting: *Deleted

## 2014-05-02 ENCOUNTER — Telehealth: Payer: Self-pay | Admitting: Family Medicine

## 2014-05-02 NOTE — Telephone Encounter (Signed)
Faxed signed refill authorization for Lidocaine patch to [facility], per Dr Lenna Sciara Copland. Confirmation page received at 8:39 am.

## 2014-05-02 NOTE — Telephone Encounter (Signed)
error 

## 2014-05-04 ENCOUNTER — Telehealth: Payer: Self-pay | Admitting: *Deleted

## 2014-05-04 NOTE — Telephone Encounter (Signed)
Patient calling to find out about a prior authorization on her lidocaine patches. Advised pt that we did not have notification that this was needed. She is very upset. She states she has called this office and the pharmacy numerous times for the past 2 weeks and is very frustrated.   Advised pt I would call the pharmacy to obtain auth information. Spoke to [place] she is faxing over request. Verified fax number.

## 2014-05-06 ENCOUNTER — Telehealth: Payer: Self-pay | Admitting: *Deleted

## 2014-05-06 ENCOUNTER — Ambulatory Visit: Payer: Medicare HMO | Admitting: Family Medicine

## 2014-05-06 NOTE — Telephone Encounter (Signed)
PA needed for Lidocaine patches- submitted to covermymeds.

## 2014-05-09 NOTE — Telephone Encounter (Signed)
[employer] called asked if pt has any underlying conditions such as DM or cancer. Advised Dx of breast cancer. Case is in review and we will be notified.

## 2014-05-09 NOTE — Telephone Encounter (Signed)
Resent prior auth with a different Dx code- 975.5

## 2014-05-20 NOTE — Telephone Encounter (Signed)
PA approved through 10/20/14. Notified pharmacy.

## 2014-06-20 ENCOUNTER — Encounter: Payer: Self-pay | Admitting: Family Medicine

## 2014-07-06 ENCOUNTER — Telehealth: Payer: Self-pay | Admitting: *Deleted

## 2014-07-06 NOTE — Telephone Encounter (Signed)
Phoned [facility] to request 2006 colonoscopy results.  Spoke with [name] who confirmed 2006 was when she had her last colonoscopy with them.  She is faxing over procedure report.

## 2014-07-11 ENCOUNTER — Telehealth: Payer: Self-pay | Admitting: *Deleted

## 2014-07-11 NOTE — Telephone Encounter (Signed)
Spoke with Margreta Journey & she is faxing over Dr. Estell Harpin 2006 colonoscopy report. Report already received & copied to be sent to medical records; health maintenance section updated with report results (GI recommended procedure be repeated in 5 years---patient is still overdue.

## 2014-08-02 IMAGING — CR DG KNEE 1-2V*L*
2 series · 2 of 2 positions shown · non-contrast
Comparison: 01/09/2011

CLINICAL DATA: Fall with knee pain.

EXAM:
LEFT KNEE - 1-2 VIEW

[AP]
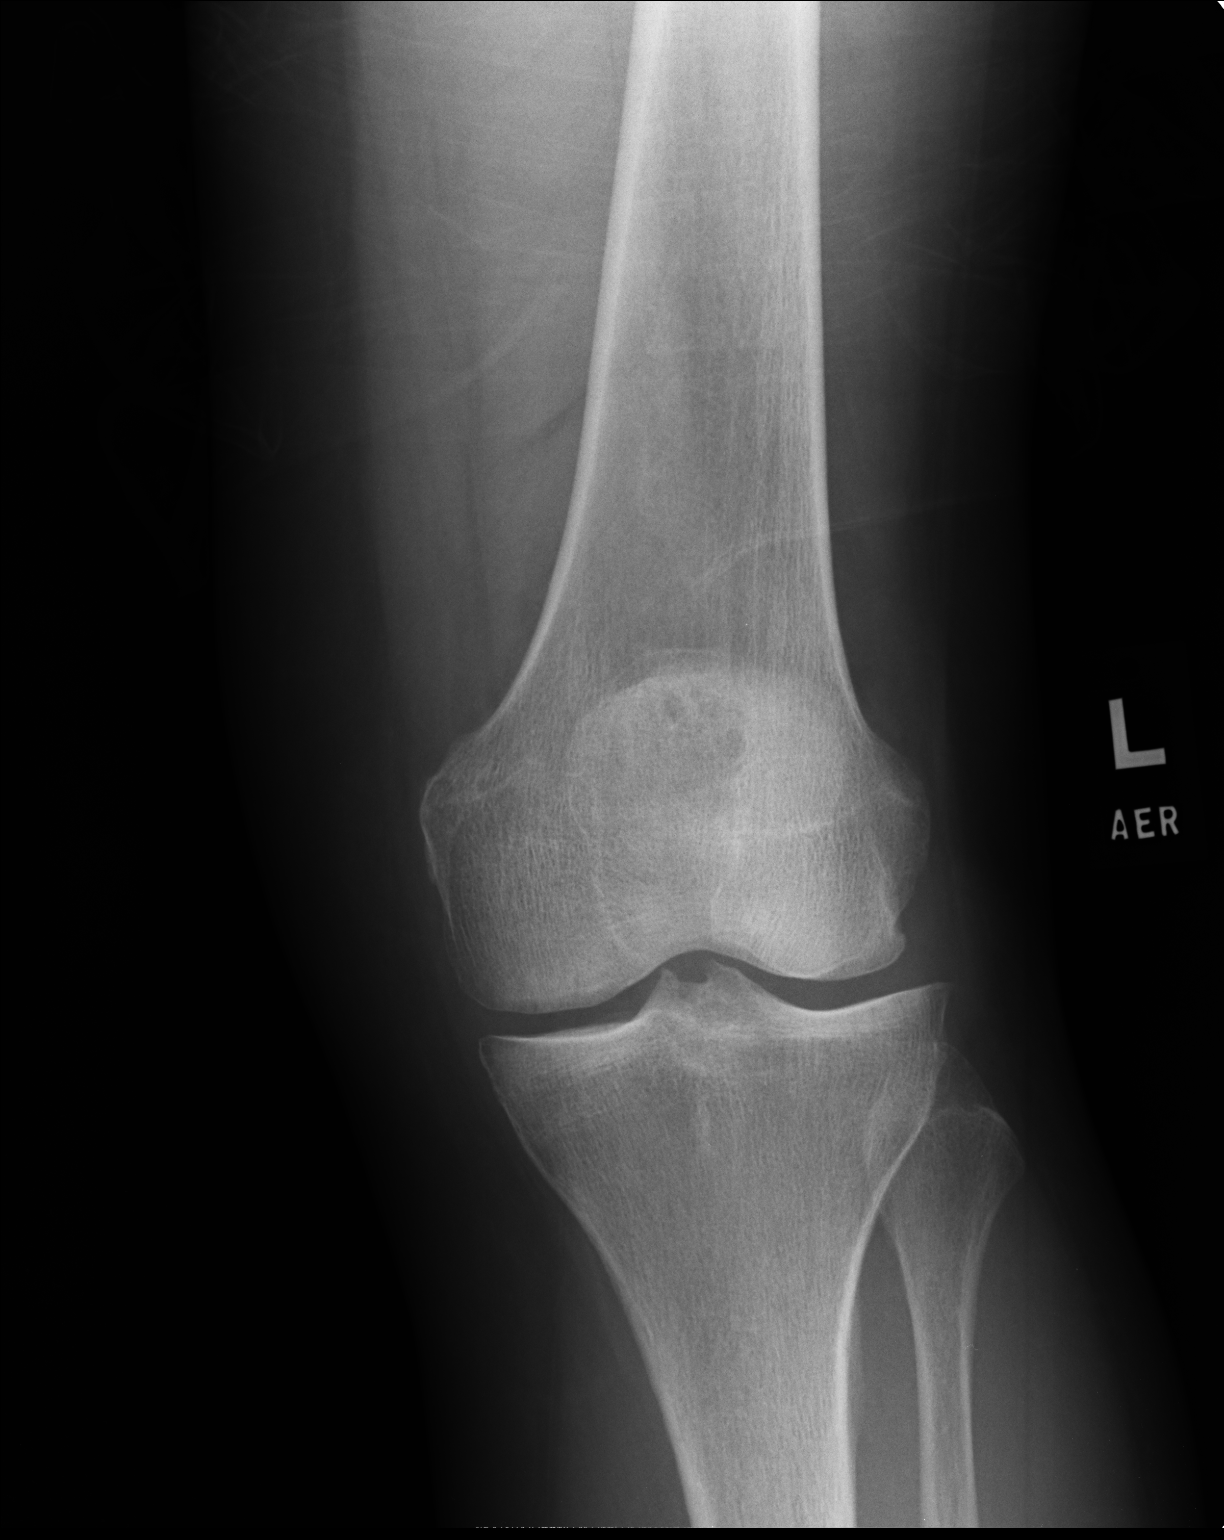

[lateral]
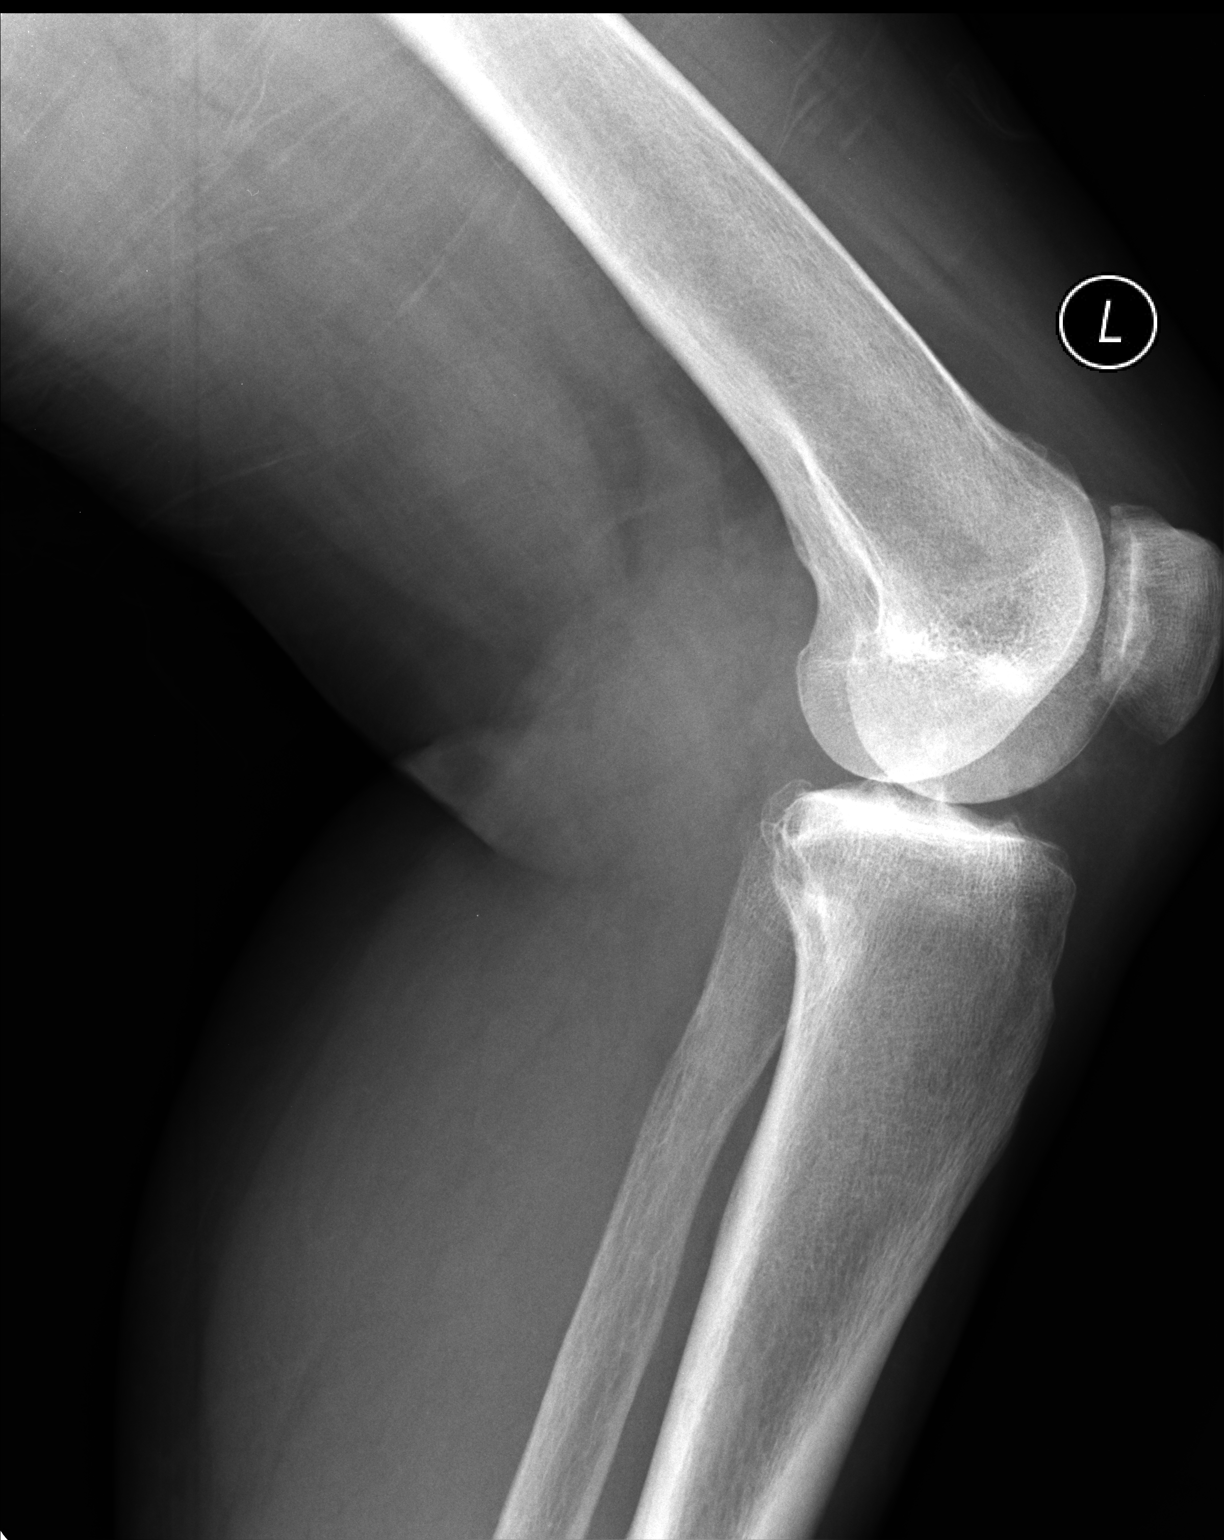

[2 of 2 positions shown; findings below may reference images not displayed]

FINDINGS: No acute fracture, dislocation or joint effusion is identified.
Osteoarthritis noted involving the medial joint space and
patellofemoral joint. No bony lesions or erosions are seen.
IMPRESSION: No acute fracture.  Osteoarthritis present.

## 2014-08-02 IMAGING — CR DG KNEE COMPLETE 4+V*R*
5 series · 5 of 5 positions shown · non-contrast
Comparison: None.

CLINICAL DATA: Knee injury with pain and swelling.

EXAM:
RIGHT KNEE - COMPLETE 4+ VIEW

[AP]
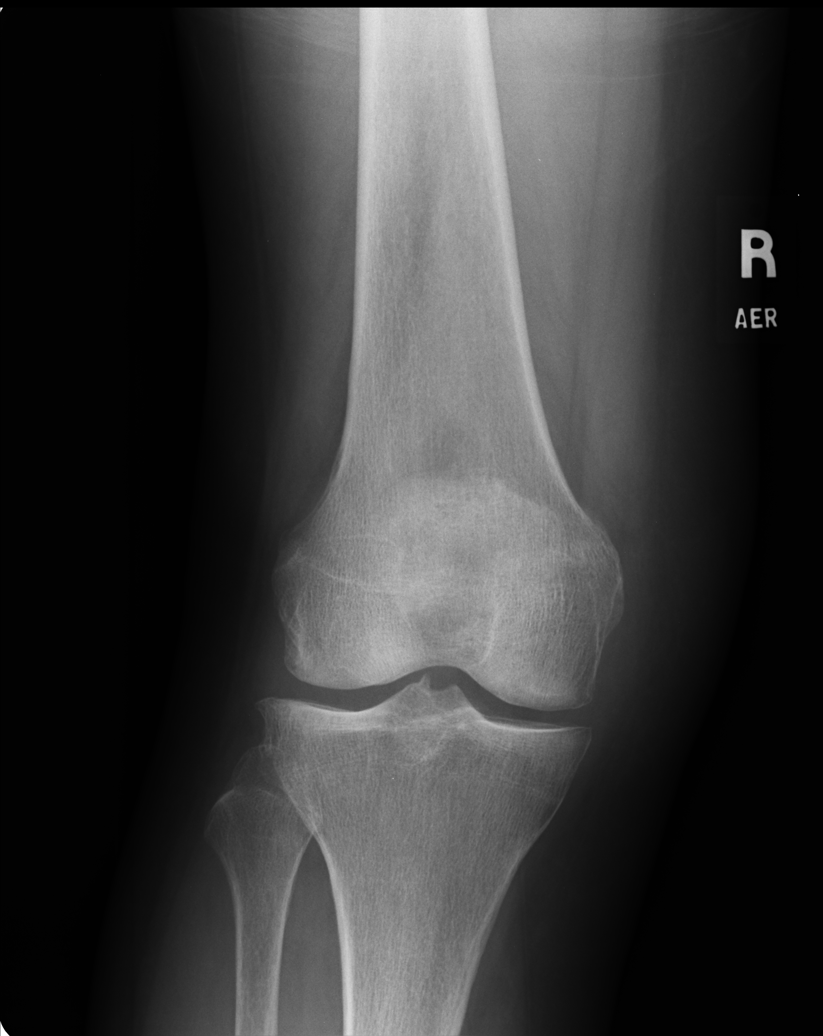

[ap ext rot]
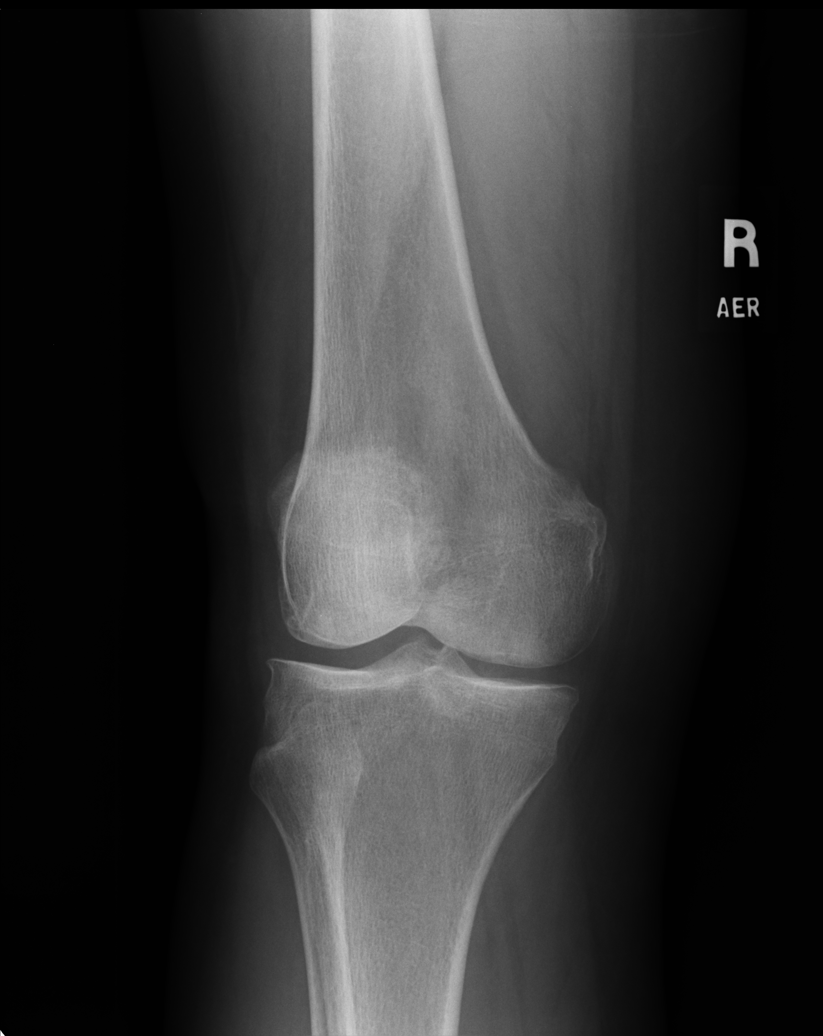

[ap int rot]
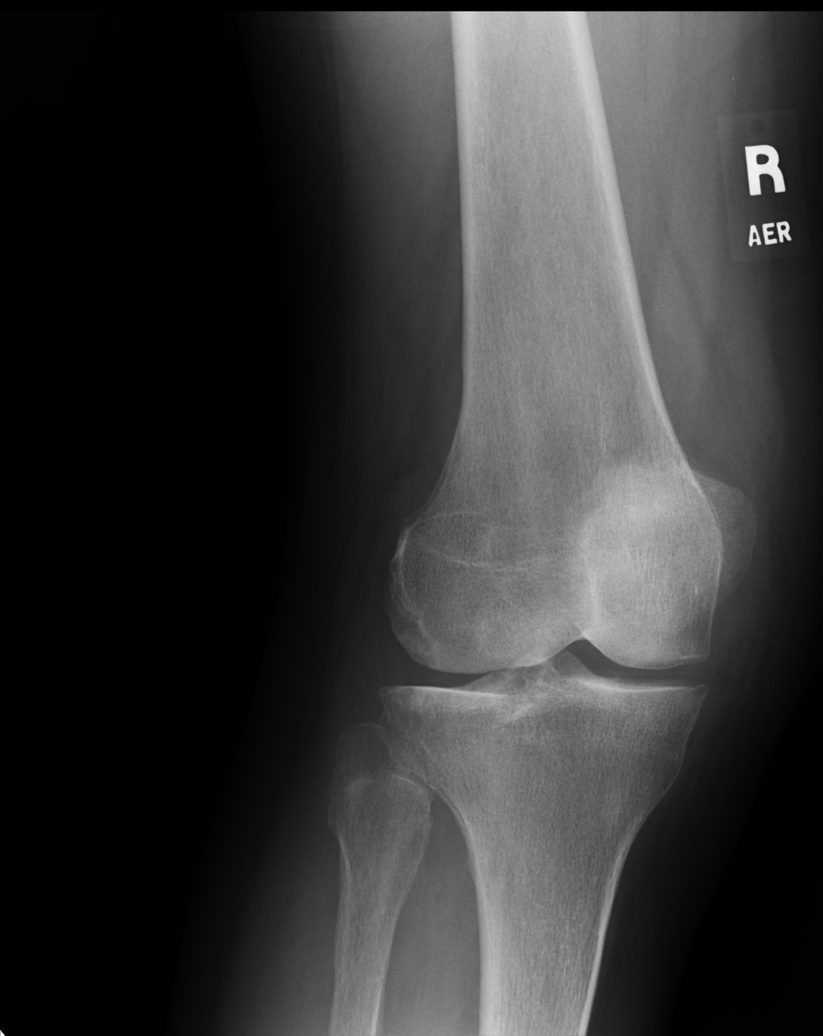

[sunrise]
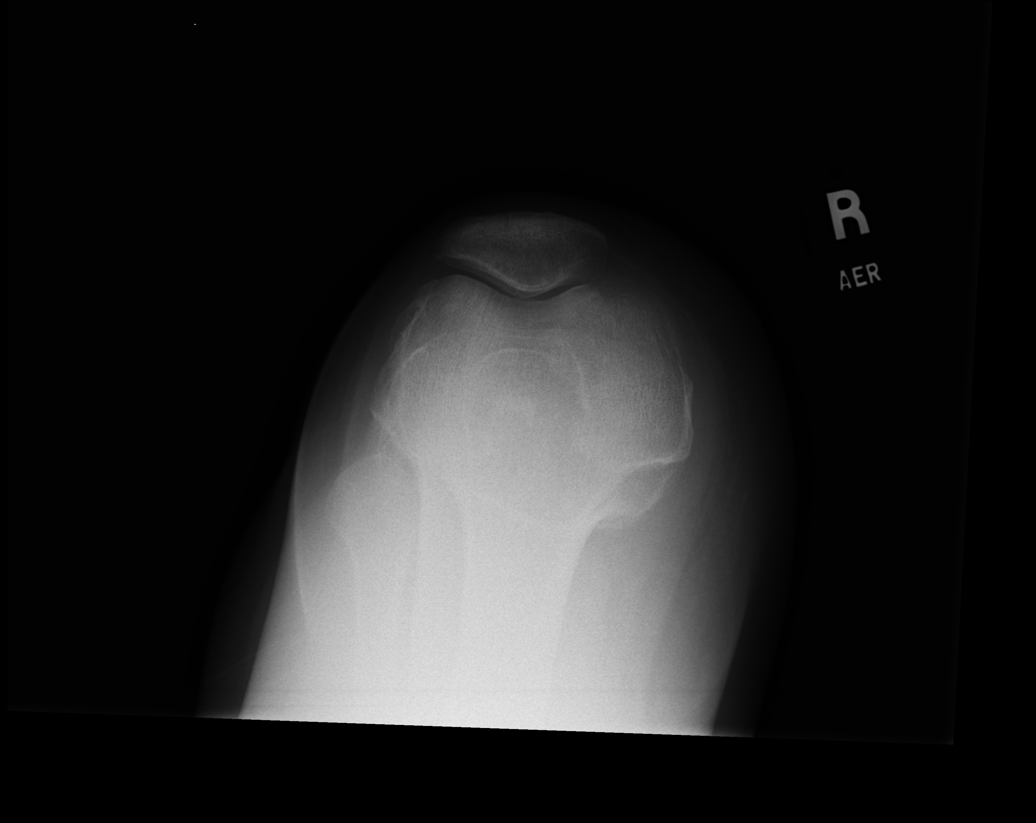

[lateral]
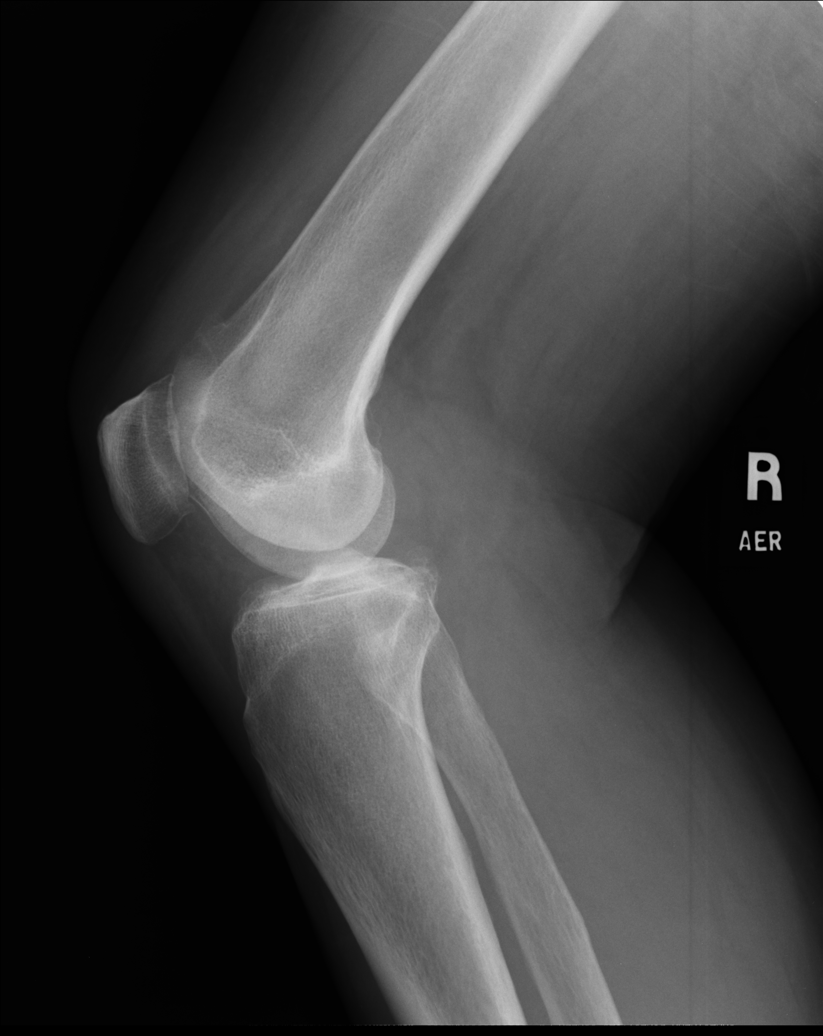

[5 of 5 positions shown; findings below may reference images not displayed]

FINDINGS: No acute fracture or dislocation identified. Some soft tissue
swelling is suspected overlying the patella. There may be a small
amount of joint fluid in the suprapatellar region. No bony lesions
or destruction identified. Osteoarthritis present with mild medial
joint space narrowing and mild patellofemoral disease.
IMPRESSION: No acute fracture. Possible small joint effusion. Underlying
osteoarthritis present.

## 2014-08-05 ENCOUNTER — Other Ambulatory Visit: Payer: Self-pay

## 2014-11-07 IMAGING — CR DG CHEST 2V
2 series · 2 of 2 positions shown · non-contrast
Comparison: 01/16/2005

CLINICAL DATA: Productive cough and wheezing.

EXAM:
CHEST  2 VIEW

[PA]
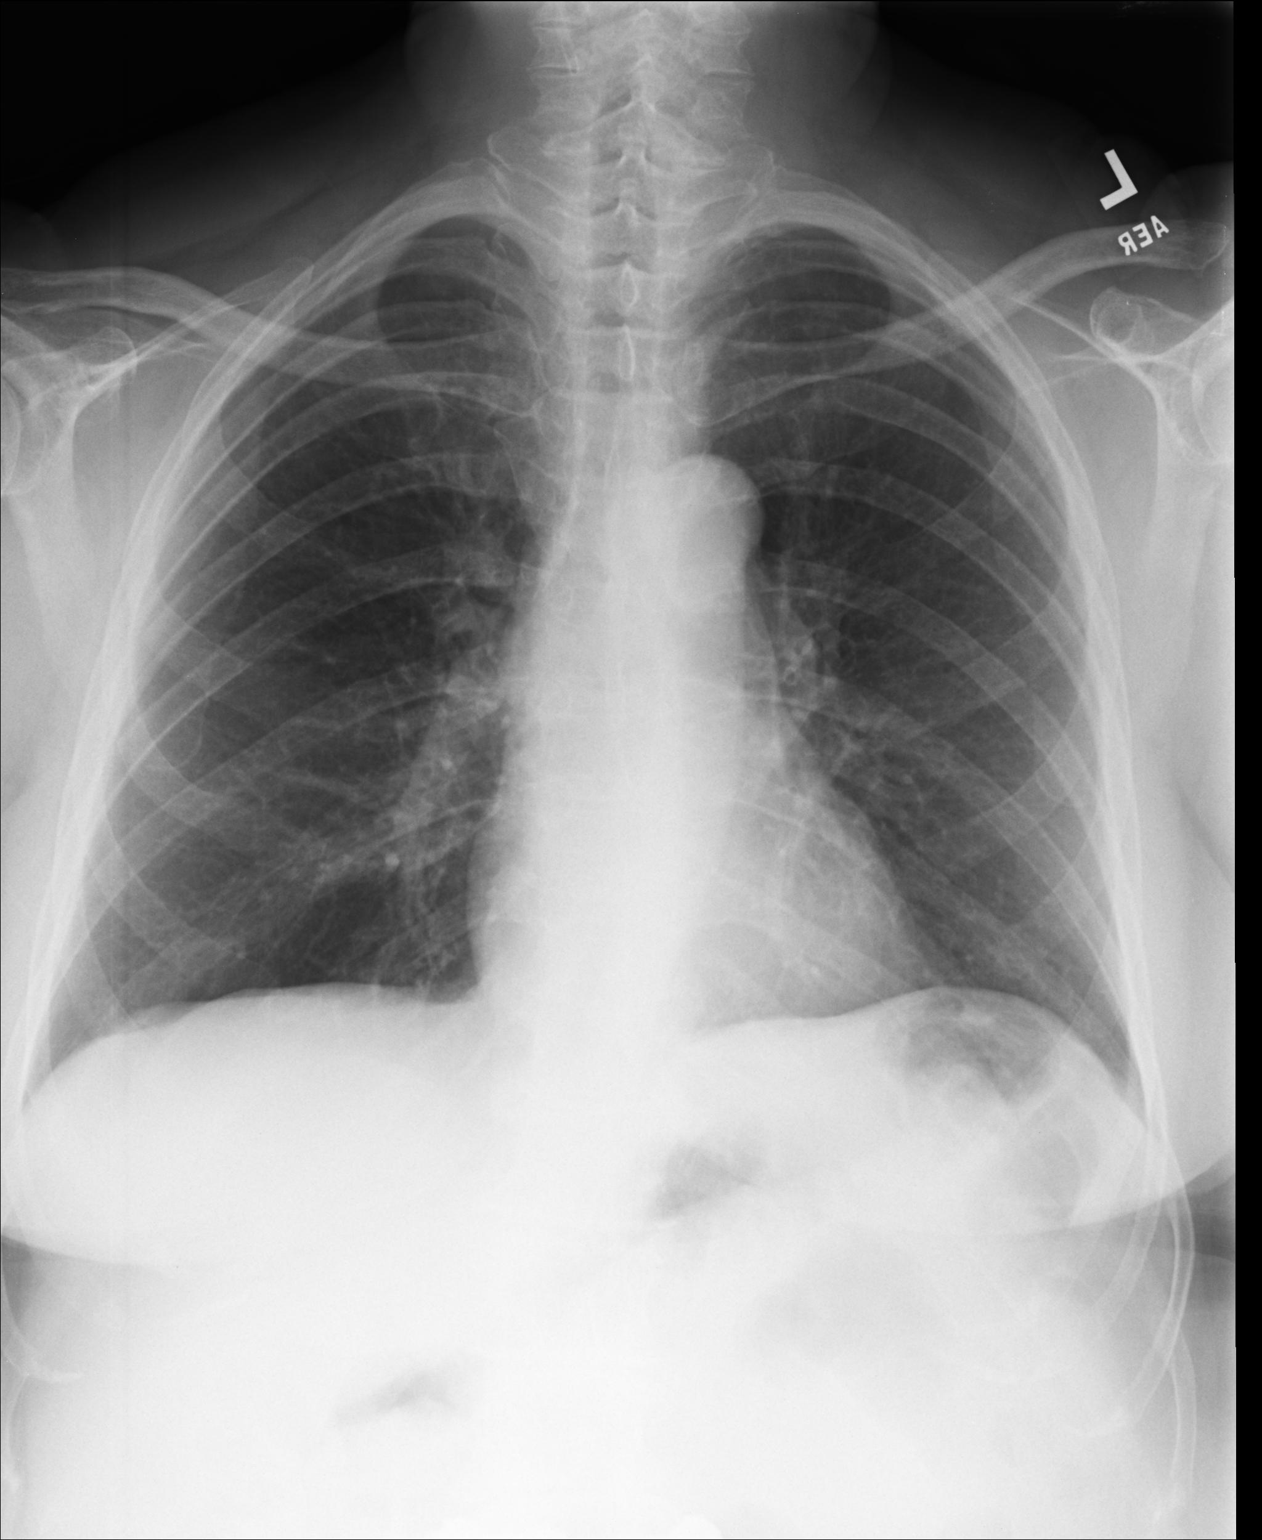

[lateral]
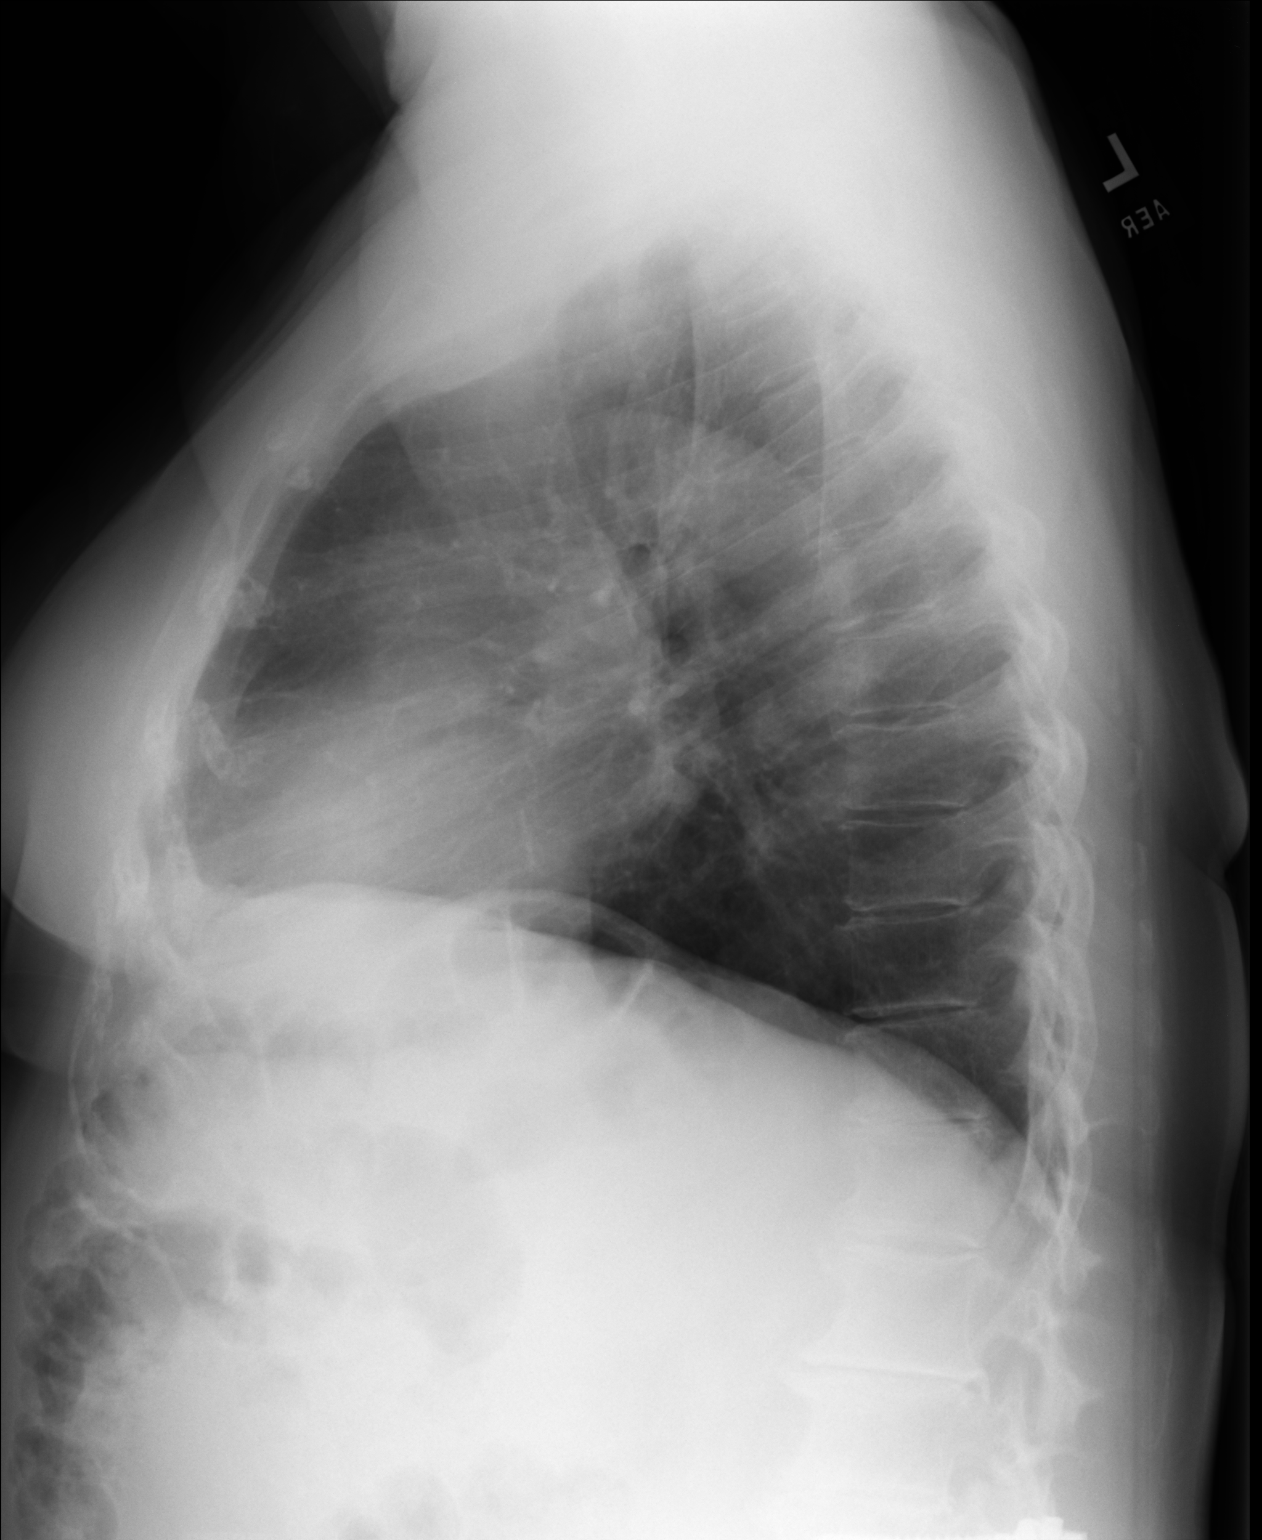

[2 of 2 positions shown; findings below may reference images not displayed]

FINDINGS: The heart size and pulmonary vascularity are normal and the lungs
are clear except for slight peribronchial thickening consistent with
bronchitis. No effusions. No osseous abnormality.
IMPRESSION: Bronchitic changes.

## 2014-12-21 ENCOUNTER — Other Ambulatory Visit: Payer: Self-pay | Admitting: Family Medicine

## 2015-01-09 ENCOUNTER — Ambulatory Visit (INDEPENDENT_AMBULATORY_CARE_PROVIDER_SITE_OTHER): Payer: Medicare HMO | Admitting: Family Medicine

## 2015-01-09 ENCOUNTER — Ambulatory Visit (INDEPENDENT_AMBULATORY_CARE_PROVIDER_SITE_OTHER): Payer: Medicare HMO

## 2015-01-09 ENCOUNTER — Encounter: Payer: Self-pay | Admitting: Family Medicine

## 2015-01-09 VITALS — BP 110/64 | HR 74 | Temp 98.1°F | Resp 16 | Ht 65.25 in | Wt 172.0 lb

## 2015-01-09 DIAGNOSIS — Z23 Encounter for immunization: Secondary | ICD-10-CM | POA: Diagnosis not present

## 2015-01-09 DIAGNOSIS — R0781 Pleurodynia: Secondary | ICD-10-CM | POA: Diagnosis not present

## 2015-01-09 DIAGNOSIS — Z131 Encounter for screening for diabetes mellitus: Secondary | ICD-10-CM | POA: Diagnosis not present

## 2015-01-09 DIAGNOSIS — Z0000 Encounter for general adult medical examination without abnormal findings: Secondary | ICD-10-CM

## 2015-01-09 DIAGNOSIS — N644 Mastodynia: Secondary | ICD-10-CM

## 2015-01-09 DIAGNOSIS — I10 Essential (primary) hypertension: Secondary | ICD-10-CM

## 2015-01-09 DIAGNOSIS — Z130 Encounter for screening for diseases of the blood and blood-forming organs and certain disorders involving the immune mechanism: Secondary | ICD-10-CM

## 2015-01-09 DIAGNOSIS — E785 Hyperlipidemia, unspecified: Secondary | ICD-10-CM

## 2015-01-09 DIAGNOSIS — B079 Viral wart, unspecified: Secondary | ICD-10-CM

## 2015-01-09 DIAGNOSIS — M5010 Cervical disc disorder with radiculopathy, unspecified cervical region: Secondary | ICD-10-CM

## 2015-01-09 DIAGNOSIS — R739 Hyperglycemia, unspecified: Secondary | ICD-10-CM

## 2015-01-09 LAB — LIPID PANEL
Cholesterol: 241 mg/dL — ABNORMAL HIGH (ref 0–200)
HDL: 46 mg/dL (ref >=46)
LDL Cholesterol: 156 mg/dL — ABNORMAL HIGH (ref 0–99)
Total CHOL/HDL Ratio: 5.2 Ratio
Triglycerides: 196 mg/dL — ABNORMAL HIGH (ref <150)
VLDL: 39 mg/dL (ref 0–40)

## 2015-01-09 LAB — CBC
HEMATOCRIT: 42.3 % (ref 36.0–46.0)
HEMOGLOBIN: 13.8 g/dL (ref 12.0–15.0)
MCH: 30.5 pg (ref 26.0–34.0)
MCHC: 32.6 g/dL (ref 30.0–36.0)
MCV: 93.6 fL (ref 78.0–100.0)
MPV: 11.1 fL (ref 8.6–12.4)
Platelets: 230 10*3/uL (ref 150–400)
RBC: 4.52 MIL/uL (ref 3.87–5.11)
RDW: 12.7 % (ref 11.5–15.5)
WBC: 5.4 10*3/uL (ref 4.0–10.5)

## 2015-01-09 LAB — HEMOGLOBIN A1C
Hgb A1c MFr Bld: 5.9 % — ABNORMAL HIGH (ref <5.7)
Mean Plasma Glucose: 123 mg/dL — ABNORMAL HIGH (ref <117)

## 2015-01-09 LAB — COMPREHENSIVE METABOLIC PANEL
ALT: 15 U/L (ref 0–35)
AST: 16 U/L (ref 0–37)
Albumin: 4.6 g/dL (ref 3.5–5.2)
Alkaline Phosphatase: 46 U/L (ref 39–117)
BUN: 15 mg/dL (ref 6–23)
CO2: 25 mEq/L (ref 19–32)
Calcium: 10.1 mg/dL (ref 8.4–10.5)
Chloride: 104 mEq/L (ref 96–112)
Creat: 0.86 mg/dL (ref 0.50–1.10)
Glucose, Bld: 95 mg/dL (ref 70–99)
Potassium: 4 mEq/L (ref 3.5–5.3)
Sodium: 139 mEq/L (ref 135–145)
Total Bilirubin: 0.7 mg/dL (ref 0.2–1.2)
Total Protein: 7.2 g/dL (ref 6.0–8.3)

## 2015-01-09 MED ORDER — LISINOPRIL 10 MG PO TABS: 10.0000 mg | ORAL_TABLET | Freq: Every day | ORAL | Status: DC

## 2015-01-09 MED ORDER — MELOXICAM 15 MG PO TABS: 15.0000 mg | ORAL_TABLET | Freq: Every day | ORAL | Status: DC

## 2015-01-09 NOTE — Patient Instructions (Signed)
I will be in touch with your labs.  Let us know if you do not hear from [facility] about your "diagnostic mammogram." If your mammo looks good but you continue to have pain we can do a CT of your chest  You are about due for a colonoscopy- please call to schedule soon [facility] ? Address: [address], [place], [place] [address] [phone]

## 2015-01-09 NOTE — Progress Notes (Signed)
[facility] [address], [place] [address] [phone]  Date:  01/09/2015   Name:  [name]   DOB:  [birth year]   MRN:  [id number]  PCP:  Lamar Blinks, MD    Chief Complaint: Annual Exam   History of Present Illness:  [name] is a 69 y.o. very pleasant female patient who presents with the following:  Here today for a CPE, no pap.  She is s/p hysterectomy Colonoscopy will be due soon, mammo is UTD She had a penumovax in 2012 at age 73.  Last labs a year ago We did fasting labs for her today.   In 2006 she had a left breast lumpectomy; she notes a tender area at the site of her lumpectomy a couple of years ago. She has her mammo  She stopped her pravachol about 90 days ago; she notes that she stopped having headaches and stomach aches when she stopped taking this.   Negative CXR a year ago  Patient Active Problem List   Diagnosis Date Noted  . Breast cancer 12/27/2013  . Injury of ulnar collateral ligament of wrist 02/02/2013  . Hypertension 09/25/2012  . Lumbar radiculopathy, chronic 09/25/2012  . Cervical radiculopathy 09/25/2012    Past Medical History  Diagnosis Date  . Arthritis   . Cancer   . Cataract   . Hypertension   . Injury of ligament of hand     right thumb ulnar collateral ligament injury.  recommend MR arthrogram of the thumb.  Marland Kitchen Neuromuscular disorder     scoliosis s/p spine surgery, has arthritis and pinched nerves    Past Surgical History  Procedure Laterality Date  . Eye surgery  2012  . Spine surgery  1981 and 2007  . Abdominal hysterectomy  2002    have ovaries  . Breast surgery  2006    left, cancer    History  Substance Use Topics  . Smoking status: Never Smoker   . Smokeless tobacco: Not on file  . Alcohol Use: No    Family History  Problem Relation Age of Onset  . Diabetes Mother     age early 21's  . Heart disease Mother     age early 20's or 17's  . Stroke Father 52    per pt mini  .  Diabetes Sister 107    type II  . Kidney disease Brother     polycystic kidney  . Alcohol abuse Maternal Grandfather     Allergies  Allergen Reactions  . Codeine Other (See Comments)    DIZZINESS    Medication list has been reviewed and updated.  Current Outpatient Prescriptions on File Prior to Visit  Medication Sig Dispense Refill  . azithromycin (ZITHROMAX Z-PAK) 250 MG tablet Take as directed on pack 6 tablet 0  . b complex vitamins tablet Take 1 tablet by mouth daily.    . cholecalciferol (VITAMIN D) 1000 UNITS tablet Take 1,000 Units by mouth 2 (two) times daily.    . clobetasol ointment (TEMOVATE) 4.19 % Apply 1 application topically 2 (two) times daily. 45 g 0  . gabapentin (NEURONTIN) 100 MG capsule Take 1 capsule (100 mg total) by mouth 3 (three) times daily. Take up to TID as needed for pinched nerve 270 capsule 3  . HYDROcodone-homatropine (HYCODAN) 5-1.5 MG/5ML syrup Take 5 mLs by mouth every 8 (eight) hours as needed for cough. 120 mL 0  . lidocaine (LIDODERM) 5 % Place 1 patch  onto the skin daily. Remove & Discard patch within 12 hours or as directed by MD 90 patch 3  . lisinopril (PRINIVIL,ZESTRIL) 10 MG tablet Take 1 tablet (10 mg total) by mouth daily. PATIENT NEEDS OFFICE VISIT FOR ADDITIONAL REFILLS 30 tablet 0  . meloxicam (MOBIC) 15 MG tablet Take 1 tablet (15 mg total) by mouth daily. PATIENT NEEDS OFFICE VISIT FOR ADDITIONAL REFILLS 30 tablet 0  . Omega-3 Fatty Acids (FISH OIL) 1000 MG CAPS Take 1,000 mg by mouth 2 (two) times daily.    . pravastatin (PRAVACHOL) 20 MG tablet Take 1 tablet (20 mg total) by mouth daily. , at night. 90 tablet 3   No current facility-administered medications on file prior to visit.    Review of Systems:  As per HPI- otherwise negative.   Physical Examination: Filed Vitals:   01/09/15 0923  BP: 110/64  Pulse: 74  Temp: 98.1 F (36.7 C)  Resp: 16   Filed Vitals:   01/09/15 0923  Height: 5' 5.25" (1.657 m)  Weight: 172  lb (78.019 kg)   Body mass index is 28.42 kg/(m^2). Ideal Body Weight: Weight in (lb) to have BMI = 25: 151.1  GEN: WDWN, NAD, Non-toxic, A & O x 3, mild overweight, looks well HEENT: Atraumatic, Normocephalic. Neck supple. No masses, No LAD.  Bilateral TM wnl, oropharynx normal.  PEERL,EOMI.   Ears and Nose: No external deformity. CV: RRR, No M/G/R. No JVD. No thrill. No extra heart sounds. PULM: CTA B, no wheezes, crackles, rhonchi. No retractions. No resp. distress. No accessory muscle use. ABD: S, NT, ND, +BS. No rebound. No HSM. EXTR: No c/c/e NEURO Normal gait.  PSYCH: Normally interactive. Conversant. Not depressed or anxious appearing.  Calm demeanor.  Breast exam: she has tenderness over her left breast/ ribs under the breast.   There is a small wart on the right upper back- froze this x3 with LN  [facility] reading (PRIMARY) by  Dr. Lorelei Pont. Left ribs/ chest: negative  LEFT RIBS - 2 VIEW  COMPARISON: Chest x-ray dated 01/19/2014  FINDINGS: There are no fractures and there is no bone destruction. There is no pneumothorax or pleural effusion. Heart size and pulmonary vascularity are normal. Lungs are clear.  IMPRESSION: Normal exam.   Assessment and Plan: Physical exam  Screening for deficiency anemia - Plan: CBC  Dyslipidemia - Plan: Comprehensive metabolic panel, Lipid panel  Screening for diabetes mellitus - Plan: Hemoglobin A1c  Immunization due - Plan: Pneumococcal conjugate vaccine 13-valent IM  Essential hypertension - Plan: lisinopril (PRINIVIL,ZESTRIL) 10 MG tablet  Breast pain - Plan: MM Digital Diagnostic Bilat  Cervical disc disorder with radiculopathy of cervical region - Plan: meloxicam (MOBIC) 15 MG tablet  Rib pain on left side - Plan: DG Ribs Unilateral Left, DG Ribs Unilateral Left, CANCELED: DG Chest 2 View  Cutaneous wart  labds pending as above.  Gave prevnar Refilled her mobic, lisinopril Changed to a diagnostic mammogram for her left  breast concern.  If this is normal and pain continues will plan for CT- she is aware and will keep me updated   Signed Lamar Blinks, MD

## 2015-01-16 ENCOUNTER — Ambulatory Visit (INDEPENDENT_AMBULATORY_CARE_PROVIDER_SITE_OTHER): Payer: Medicare HMO | Admitting: Family Medicine

## 2015-01-16 ENCOUNTER — Ambulatory Visit (INDEPENDENT_AMBULATORY_CARE_PROVIDER_SITE_OTHER): Payer: Medicare HMO

## 2015-01-16 VITALS — BP 124/82 | HR 73 | Temp 97.8°F | Resp 16 | Ht 65.0 in | Wt 172.0 lb

## 2015-01-16 DIAGNOSIS — M7551 Bursitis of right shoulder: Secondary | ICD-10-CM | POA: Diagnosis not present

## 2015-01-16 DIAGNOSIS — M25511 Pain in right shoulder: Secondary | ICD-10-CM

## 2015-01-16 MED ORDER — METHYLPREDNISOLONE ACETATE 80 MG/ML IJ SUSP: 40.0000 mg | Freq: Once | INTRAMUSCULAR | Status: DC

## 2015-01-16 NOTE — Patient Instructions (Signed)
Continue the meloxicam  Apply ice for about 15 or 20 minutes several times daily for a few days  Additional Tylenol if needed for pain  If unexpected problems of worsening occurred get rechecked  Return if further problems

## 2015-01-16 NOTE — Progress Notes (Signed)
Subjective: Patient was doing some yard work. She had had a little shoulder pain when she saw Dr. Edilia Bo for physical week ago. However it's gotten bad the last few days. It bothers her day and night. She has had a history of bursitis in the past, and says this is bursitis.  She also does have a history of breast cancer some years ago.  Objective: Tender right at the top of the right shoulder. Pain on abduction. Rotation causes some pain. Range of motion is fairly good however.  Assessment: Right shoulder pain, probable bursitis  Plan: X-ray right shoulder. She has the history of having had her breast cancer.  [facility] reading (PRIMARY) by  Dr. Linna Darner Normal x-ray shoulder  Procedure note: Procedure was explained to the patient. Using sterile technique the shoulder was numbed with ethyl chloride and then with a little 1% lidocaine at the injection site. The injection was performed using a 20-gauge needle into the subacromial bursal region. Patient tolerated this well. 40 mg of Depo-Medrol and 1 mL of 2% lidocaine were used.  Explained to the patient if she got acutely inflamed at anytime she should come right in for a recheck. It will take a few days to see how this does. Return if further problems. She R any is on meloxicam, and will take some Tylenol along with it. Marland Kitchen

## 2015-02-01 ENCOUNTER — Other Ambulatory Visit: Payer: Self-pay | Admitting: Family Medicine

## 2015-03-06 ENCOUNTER — Encounter: Payer: Self-pay | Admitting: Family Medicine

## 2015-03-31 NOTE — Addendum Note (Signed)
Addended by: Constance Goltz on: 03/31/2015 03:54 PM   Modules accepted: Miquel Dunn

## 2015-04-06 ENCOUNTER — Telehealth: Payer: Self-pay | Admitting: Family Medicine

## 2015-04-06 NOTE — Telephone Encounter (Signed)
Patient states that the Lisinopril is causing her to have a cough. She is requesting a different BP medication.  [phone]

## 2015-04-07 ENCOUNTER — Other Ambulatory Visit: Payer: Self-pay | Admitting: Family Medicine

## 2015-04-07 DIAGNOSIS — I10 Essential (primary) hypertension: Secondary | ICD-10-CM

## 2015-04-07 MED ORDER — LOSARTAN POTASSIUM 50 MG PO TABS
50.0000 mg | ORAL_TABLET | Freq: Every day | ORAL | Status: DC
Start: 2015-04-07 — End: 2016-01-16

## 2015-07-04 ENCOUNTER — Other Ambulatory Visit: Payer: Self-pay | Admitting: Physician Assistant

## 2015-08-10 DIAGNOSIS — C44319 Basal cell carcinoma of skin of other parts of face: Secondary | ICD-10-CM | POA: Diagnosis not present

## 2015-09-21 DIAGNOSIS — D225 Melanocytic nevi of trunk: Secondary | ICD-10-CM | POA: Diagnosis not present

## 2015-09-21 DIAGNOSIS — Z85828 Personal history of other malignant neoplasm of skin: Secondary | ICD-10-CM | POA: Diagnosis not present

## 2015-09-21 DIAGNOSIS — L821 Other seborrheic keratosis: Secondary | ICD-10-CM | POA: Diagnosis not present

## 2015-11-02 DIAGNOSIS — R69 Illness, unspecified: Secondary | ICD-10-CM | POA: Diagnosis not present

## 2015-11-10 ENCOUNTER — Encounter: Payer: Self-pay | Admitting: Family Medicine

## 2015-11-15 ENCOUNTER — Encounter: Payer: Self-pay | Admitting: Family Medicine

## 2015-11-30 ENCOUNTER — Telehealth: Payer: Self-pay

## 2016-01-16 ENCOUNTER — Encounter: Payer: Self-pay | Admitting: Family Medicine

## 2016-01-16 ENCOUNTER — Ambulatory Visit (INDEPENDENT_AMBULATORY_CARE_PROVIDER_SITE_OTHER): Payer: Medicare HMO | Admitting: Family Medicine

## 2016-01-16 VITALS — BP 128/74 | HR 69 | Temp 98.5°F | Resp 16 | Ht 65.0 in | Wt 162.8 lb

## 2016-01-16 DIAGNOSIS — M5412 Radiculopathy, cervical region: Secondary | ICD-10-CM | POA: Diagnosis not present

## 2016-01-16 DIAGNOSIS — M5010 Cervical disc disorder with radiculopathy, unspecified cervical region: Secondary | ICD-10-CM

## 2016-01-16 DIAGNOSIS — I10 Essential (primary) hypertension: Secondary | ICD-10-CM | POA: Diagnosis not present

## 2016-01-16 DIAGNOSIS — E785 Hyperlipidemia, unspecified: Secondary | ICD-10-CM

## 2016-01-16 DIAGNOSIS — Z780 Asymptomatic menopausal state: Secondary | ICD-10-CM | POA: Diagnosis not present

## 2016-01-16 DIAGNOSIS — Z0000 Encounter for general adult medical examination without abnormal findings: Secondary | ICD-10-CM | POA: Diagnosis not present

## 2016-01-16 DIAGNOSIS — R5383 Other fatigue: Secondary | ICD-10-CM

## 2016-01-16 DIAGNOSIS — M5416 Radiculopathy, lumbar region: Secondary | ICD-10-CM | POA: Diagnosis not present

## 2016-01-16 LAB — POCT URINALYSIS DIP (MANUAL ENTRY)
Bilirubin, UA: NEGATIVE
Blood, UA: NEGATIVE
GLUCOSE UA: NEGATIVE
Ketones, POC UA: NEGATIVE
LEUKOCYTES UA: NEGATIVE
NITRITE UA: NEGATIVE
PROTEIN UA: NEGATIVE
SPEC GRAV UA: 1.01
UROBILINOGEN UA: 0.2
pH, UA: 7.5

## 2016-01-16 LAB — CBC
HCT: 39.6 % (ref 36.0–46.0)
Hemoglobin: 13.2 g/dL (ref 12.0–15.0)
MCH: 30.7 pg (ref 26.0–34.0)
MCHC: 33.3 g/dL (ref 30.0–36.0)
MCV: 92.1 fL (ref 78.0–100.0)
MPV: 11.2 fL (ref 8.6–12.4)
PLATELETS: 231 10*3/uL (ref 150–400)
RBC: 4.3 MIL/uL (ref 3.87–5.11)
RDW: 12.9 % (ref 11.5–15.5)
WBC: 6.1 10*3/uL (ref 4.0–10.5)

## 2016-01-16 LAB — POC MICROSCOPIC URINALYSIS (UMFC): MUCUS RE: ABSENT

## 2016-01-16 LAB — VITAMIN B12: VITAMIN B 12: 1071 pg/mL (ref 200–1100)

## 2016-01-16 LAB — TSH: TSH: 1.2 m[IU]/L

## 2016-01-16 MED ORDER — MELOXICAM 15 MG PO TABS: 15.0000 mg | ORAL_TABLET | Freq: Every day | ORAL | Status: DC

## 2016-01-16 MED ORDER — LISINOPRIL 10 MG PO TABS: 10.0000 mg | ORAL_TABLET | Freq: Every day | ORAL | Status: DC

## 2016-01-16 NOTE — Progress Notes (Signed)
Subjective:    Patient ID: [name], female    DOB: 1946-08-02, 70 y.o.   MRN: [id number]  HPI This is a pleasant 70 yo female who presents today for CPE. She is very active. She and her husband help with care of an at risk toddler. She is concerned about her husband's recent memory difficulties which are causing her more stress.   Last CPE- 3/16 Mammo- 02/08/15 Pap- hysterectomy Colonoscopy- 2006, Dr. Penelope Coop. She realizes she is overdue. Would prefer Cologuard testing with understanding that she would need colonoscopy if Cogloguard +.  Tdap- 07/22/11 Pneumonia- has had both prevnar and pneumoxoxxol 23 Zoster- 2011 Flu-annual Eye- irregular Dental- regular Exercise- not as regular, 2-3 times a week with Silver Sneakers   She has felt more tired for the last 3 months. Wonders if her B vitamins are low.   Not sleeping well due to frequent interruptions from arthritis pain- spine and neck. Having some twinges in her knees.   Has been watching carbs and portions and has lost 20 pounds.   Review of Systems  Constitutional: Negative.   HENT: Positive for nosebleeds and sinus pressure.   Eyes: Negative.   Respiratory: Negative.   Cardiovascular: Negative.   Gastrointestinal: Negative.   Endocrine: Negative.   Genitourinary: Negative.   Musculoskeletal: Positive for myalgias, back pain and neck pain.  Skin: Negative.   Allergic/Immunologic: Positive for environmental allergies.  Neurological: Negative.   Hematological: Negative.   Psychiatric/Behavioral: Negative.        Objective:   Physical Exam Physical Exam  Constitutional: She is oriented to person, place, and time. She appears well-developed and well-nourished. No distress.  HENT:  Head: Normocephalic and atraumatic.  Right Ear: External ear normal.  Left Ear: External ear normal.  Nose: Nose normal.  Mouth/Throat: Oropharynx is clear and moist. No oropharyngeal exudate.  Eyes: Conjunctivae are normal. Pupils  are equal, round, and reactive to light.  Neck: Normal range of motion. Neck supple. No JVD present. No thyromegaly present.  Cardiovascular: Normal rate, regular rhythm, normal heart sounds and intact distal pulses.   Pulmonary/Chest: Effort normal and breath sounds normal. Right breast exhibits no inverted nipple, no mass, no nipple discharge, no skin change and no tenderness. Left breast exhibits no inverted nipple, no mass, no nipple discharge, no skin change and no tenderness. Breasts are symmetrical.  Abdominal: Soft. Bowel sounds are normal. She exhibits no distension and no mass. There is no tenderness. There is no rebound and no guarding.  Musculoskeletal: Normal range of motion. She exhibits mild, generalized tenderness of back.  Lymphadenopathy:    She has no cervical adenopathy.  Neurological: She is alert and oriented to person, place, and time. She has normal reflexes.  Skin: Skin is warm and dry. She is not diaphoretic.  Psychiatric: She has a normal mood and affect. Her behavior is normal. Judgment and thought content normal.  Vitals reviewed.  BP 128/74 mmHg  Pulse 69  Temp(Src) 98.5 F (36.9 C) (Other (Comment))  Resp 16  Ht 5\' 5"  (1.651 m)  Wt 162 lb 12.8 oz (73.846 kg)  BMI 27.09 kg/m2  SpO2 96% Wt Readings from Last 3 Encounters:  01/16/16 162 lb 12.8 oz (73.846 kg)  01/16/15 172 lb (78.019 kg)  01/09/15 172 lb (78.019 kg)       Assessment & Plan:  1. Annual physical exam -- Discussed and encouraged healthy lifestyle choices- adequate sleep, regular exercise, stress management and healthy food choices.   2.  Essential hypertension - well controlled - POCT urinalysis dipstick - POCT Microscopic Urinalysis ([facility]) - COMPLETE METABOLIC PANEL WITH GFR - lisinopril (PRINIVIL,ZESTRIL) 10 MG tablet; Take 1 tablet (10 mg total) by mouth daily.  Dispense: 90 tablet; Refill: 3  3. Lumbar radiculopathy, chronic - encouraged continued regular exercise, stretching, heat  prn  4. Cervical radiculopathy - see #3  5. Other fatigue - CBC - TSH - Vitamin B12 - VITAMIN D 25 Hydroxy (Vit-D Deficiency, Fractures) - Hemoglobin A1c - COMPLETE METABOLIC PANEL WITH GFR  6. Hyperlipidemia - Lipid panel  7. Cervical disc disorder with radiculopathy of cervical region - meloxicam (MOBIC) 15 MG tablet; Take 1 tablet (15 mg total) by mouth daily.  Dispense: 90 tablet; Refill: 3 - discussed risks/benefits and encouraged her to use as sparingly as possible.  8. Screening for colon cancer  - Cologuard  - follow up 6 months  Clarene Reamer, FNP-BC  [facility]  01/16/2016 10:32 AM

## 2016-01-16 NOTE — Patient Instructions (Signed)
Keep up the good work with healthy food choices and exercise!

## 2016-01-17 LAB — HEMOGLOBIN A1C
Hgb A1c MFr Bld: 5.8 % — ABNORMAL HIGH (ref <5.7)
MEAN PLASMA GLUCOSE: 120 mg/dL

## 2016-01-17 LAB — COMPLETE METABOLIC PANEL WITH GFR
ALT: 15 U/L (ref 6–29)
AST: 18 U/L (ref 10–35)
Albumin: 4.6 g/dL (ref 3.6–5.1)
Alkaline Phosphatase: 44 U/L (ref 33–130)
BUN: 21 mg/dL (ref 7–25)
CHLORIDE: 104 mmol/L (ref 98–110)
CO2: 26 mmol/L (ref 20–31)
CREATININE: 1.02 mg/dL — AB (ref 0.50–0.99)
Calcium: 9.8 mg/dL (ref 8.6–10.4)
GFR, Est African American: 65 mL/min (ref >=60)
GFR, Est Non African American: 56 mL/min — ABNORMAL LOW (ref >=60)
GLUCOSE: 98 mg/dL (ref 65–99)
Potassium: 4.3 mmol/L (ref 3.5–5.3)
Sodium: 141 mmol/L (ref 135–146)
Total Bilirubin: 0.7 mg/dL (ref 0.2–1.2)
Total Protein: 7.2 g/dL (ref 6.1–8.1)

## 2016-01-17 LAB — LIPID PANEL
CHOL/HDL RATIO: 5.8 ratio — AB (ref <=5.0)
CHOLESTEROL: 257 mg/dL — AB (ref 125–200)
HDL: 44 mg/dL — ABNORMAL LOW (ref >=46)
LDL Cholesterol: 182 mg/dL — ABNORMAL HIGH (ref <130)
Triglycerides: 154 mg/dL — ABNORMAL HIGH (ref <150)
VLDL: 31 mg/dL — ABNORMAL HIGH (ref <30)

## 2016-01-17 LAB — VITAMIN D 25 HYDROXY (VIT D DEFICIENCY, FRACTURES): VIT D 25 HYDROXY: 92 ng/mL (ref 30–100)

## 2016-01-19 ENCOUNTER — Encounter: Payer: Self-pay | Admitting: Family Medicine

## 2016-01-19 DIAGNOSIS — M5010 Cervical disc disorder with radiculopathy, unspecified cervical region: Secondary | ICD-10-CM

## 2016-01-19 DIAGNOSIS — Z780 Asymptomatic menopausal state: Secondary | ICD-10-CM

## 2016-01-19 DIAGNOSIS — M5416 Radiculopathy, lumbar region: Secondary | ICD-10-CM

## 2016-01-29 ENCOUNTER — Telehealth: Payer: Self-pay

## 2016-01-29 DIAGNOSIS — E2839 Other primary ovarian failure: Secondary | ICD-10-CM

## 2016-01-29 NOTE — Telephone Encounter (Signed)
Patient is calling because she was supposed to have a bone density test on the same day of her mamogram and the order wasn't put in. Please call once completed! [phone]

## 2016-01-30 ENCOUNTER — Other Ambulatory Visit: Payer: Self-pay | Admitting: Family Medicine

## 2016-01-30 NOTE — Telephone Encounter (Signed)
Ok? Order pended.

## 2016-01-30 NOTE — Telephone Encounter (Deleted)
That is fine. I see where the order is pended, but not where to sign it.

## 2016-02-05 ENCOUNTER — Other Ambulatory Visit: Payer: Self-pay

## 2016-02-05 DIAGNOSIS — E2839 Other primary ovarian failure: Secondary | ICD-10-CM

## 2016-02-09 ENCOUNTER — Encounter: Payer: Self-pay | Admitting: Family Medicine

## 2016-02-09 DIAGNOSIS — Z1231 Encounter for screening mammogram for malignant neoplasm of breast: Secondary | ICD-10-CM | POA: Diagnosis not present

## 2016-02-09 DIAGNOSIS — Z853 Personal history of malignant neoplasm of breast: Secondary | ICD-10-CM | POA: Diagnosis not present

## 2016-02-09 DIAGNOSIS — Z780 Asymptomatic menopausal state: Secondary | ICD-10-CM | POA: Diagnosis not present

## 2016-02-09 LAB — HM DEXA SCAN
HM DEXA SCAN: NORMAL
HM Dexa Scan: NORMAL

## 2016-02-09 LAB — HM MAMMOGRAPHY

## 2016-02-15 ENCOUNTER — Encounter: Payer: Self-pay | Admitting: Family Medicine

## 2016-05-30 ENCOUNTER — Ambulatory Visit (INDEPENDENT_AMBULATORY_CARE_PROVIDER_SITE_OTHER): Payer: Medicare HMO | Admitting: Family Medicine

## 2016-05-30 ENCOUNTER — Ambulatory Visit: Payer: Medicare HMO

## 2016-05-30 VITALS — BP 126/76 | HR 97 | Temp 98.0°F | Resp 16 | Ht 64.5 in | Wt 165.6 lb

## 2016-05-30 DIAGNOSIS — M542 Cervicalgia: Secondary | ICD-10-CM

## 2016-05-30 DIAGNOSIS — M7551 Bursitis of right shoulder: Secondary | ICD-10-CM

## 2016-05-30 DIAGNOSIS — M25511 Pain in right shoulder: Secondary | ICD-10-CM

## 2016-05-30 DIAGNOSIS — G8929 Other chronic pain: Secondary | ICD-10-CM | POA: Diagnosis not present

## 2016-05-30 MED ORDER — PREDNISONE 20 MG PO TABS: ORAL_TABLET | ORAL | 0 refills | Status: DC

## 2016-05-30 MED ORDER — CYCLOBENZAPRINE HCL 10 MG PO TABS: 10.0000 mg | ORAL_TABLET | Freq: Three times a day (TID) | ORAL | 0 refills | Status: DC | PRN

## 2016-05-30 NOTE — Patient Instructions (Addendum)
Start Prednisone in the morning and take as follows: Take 3 pills in morning x3days, 2 pills in morning x3days, 1 pill in morning  x3days  Take cyclobenzaprine 10 mg up to 3 times daily as needed for muscle pain.  Orthopedics referral sent and they will call you to schedule and appointment.  IF you received an x-ray today, you will receive an invoice from [facility]. Please contact [facility] at [phone] with questions or concerns regarding your invoice.   IF you received labwork today, you will receive an invoice from [employer]. Please contact [facility] at [phone] with questions or concerns regarding your invoice.   Our billing staff will not be able to assist you with questions regarding bills from these companies.  You will be contacted with the lab results as soon as they are available. The fastest way to get your results is to activate your My Chart account. Instructions are located on the last page of this paperwork. If you have not heard from [place] regarding the results in 2 weeks, please contact this office.     Shoulder Pain The shoulder is the joint that connects your arm to your body. Muscles and band-like tissues that connect bones to muscles (tendons) hold the joint together. Shoulder pain is felt if an injury or medical problem affects one or more parts of the shoulder. HOME CARE   Put ice on the sore area.  Put ice in a plastic bag.  Place a towel between your skin and the bag.  Leave the ice on for 15-20 minutes, 03-04 times a day for the first 2 days.  Stop using cold packs if they do not help with the pain.  If you were given something to keep your shoulder from moving (sling; shoulder immobilizer), wear it as told. Only take it off to shower or bathe.  Move your arm as little as possible, but keep your hand moving to prevent puffiness (swelling).  Squeeze a soft Lawhead or foam pad as much as possible to help  prevent swelling.  Take medicine as told by your doctor. GET HELP IF:  You have progressing new pain in your arm, hand, or fingers.  Your hand or fingers get cold.  Your medicine does not help lessen your pain. GET HELP RIGHT AWAY IF:   Your arm, hand, or fingers are numb or tingling.  Your arm, hand, or fingers are puffy (swollen), painful, or turn white or blue. MAKE SURE YOU:   Understand these instructions.  Will watch your condition.  Will get help right away if you are not doing well or get worse.   This information is not intended to replace advice given to you by your health care provider. Make sure you discuss any questions you have with your health care provider.   Document Released: 03/25/2008 Document Revised: 10/28/2014 Document Reviewed: 01/30/2015 Elsevier Interactive Patient Education 2016 Elsevier Inc.  Cervical Sprain A cervical sprain is when the tissues (ligaments) that hold the neck bones in place stretch or tear. HOME CARE   Put ice on the injured area.  Put ice in a plastic bag.  Place a towel between your skin and the bag.  Leave the ice on for 15-20 minutes, 3-4 times a day.  You may have been given a collar to wear. This collar keeps your neck from moving while you heal.  Do not take the collar off unless told by your doctor.  If you have long hair, keep  it outside of the collar.  Ask your doctor before changing the position of your collar. You may need to change its position over time to make it more comfortable.  If you are allowed to take off the collar for cleaning or bathing, follow your doctor's instructions on how to do it safely.  Keep your collar clean by wiping it with mild soap and water. Dry it completely. If the collar has removable pads, remove them every 1-2 days to hand wash them with soap and water. Allow them to air dry. They should be dry before you wear them in the collar.  Do not drive while wearing the  collar.  Only take medicine as told by your doctor.  Keep all doctor visits as told.  Keep all physical therapy visits as told.  Adjust your work station so that you have good posture while you work.  Avoid positions and activities that make your problems worse.  Warm up and stretch before being active. GET HELP IF:  Your pain is not controlled with medicine.  You cannot take less pain medicine over time as planned.  Your activity level does not improve as expected. GET HELP RIGHT AWAY IF:   You are bleeding.  Your stomach is upset.  You have an allergic reaction to your medicine.  You develop new problems that you cannot explain.  You lose feeling (become numb) or you cannot move any part of your body (paralysis).  You have tingling or weakness in any part of your body.  Your symptoms get worse. Symptoms include:  Pain, soreness, stiffness, puffiness (swelling), or a burning feeling in your neck.  Pain when your neck is touched.  Shoulder or upper back pain.  Limited ability to move your neck.  Headache.  Dizziness.  Your hands or arms feel week, lose feeling, or tingle.  Muscle spasms.  Difficulty swallowing or chewing. MAKE SURE YOU:   Understand these instructions.  Will watch your condition.  Will get help right away if you are not doing well or get worse.   This information is not intended to replace advice given to you by your health care provider. Make sure you discuss any questions you have with your health care provider.   Document Released: 03/25/2008 Document Revised: 06/09/2013 Document Reviewed: 04/14/2013 Elsevier Interactive Patient Education [employer].

## 2016-05-30 NOTE — Progress Notes (Signed)
Patient ID: [name], female    DOB: 1946/09/08, 70 y.o.   MRN: [id number]  PCP: Lamar Blinks, MD  Chief Complaint  Patient presents with  . Shoulder Pain    RIGHT to the neck x 6 weeks    Subjective:   HPI Presents for evaluation of shoulder pain 5-6 weeks.  No recent injury to right shoulder. Tender to touch at top of shoulder. History of chronic neck pain. Feels she has overused the muscle due lifting her grandchild and she participates in regular exercise. Denies tingling. Reports intermittent numbness in right arm at night. Right arm pain is more pronounced if she lays on her right side and or arm is in a non-dependent position.  . Social History   Social History  . Marital status: Married    Spouse name: N/A  . Number of children: N/A  . Years of education: N/A   Occupational History  . Retired    Social History Main Topics  . Smoking status: Never Smoker  . Smokeless tobacco: Not on file  . Alcohol use No  . Drug use: No  . Sexual activity: Yes   Other Topics Concern  . Not on file   Social History Narrative   Married   Education: [employer]   Exercise: two times a week   . Family History  Problem Relation Age of Onset  . Diabetes Mother     age early 48's  . Heart disease Mother     age early 63's or 26's  . Hypertension Mother   . Stroke Father 29    per pt mini  . Diabetes Sister 30    type II  . Kidney disease Brother     polycystic kidney  . Alcohol abuse Maternal Grandfather    Review of Systems  Constitutional: Negative.   HENT: Negative.   Respiratory: Negative.   Cardiovascular: Negative.   Musculoskeletal: Positive for arthralgias and neck pain. Negative for neck stiffness.  Skin: Negative.        Patient Active Problem List   Diagnosis Date Noted  . Breast cancer ([place]) 12/27/2013  . Injury of ulnar collateral ligament of wrist 02/02/2013  . Hypertension 09/25/2012  . Lumbar radiculopathy, chronic 09/25/2012  .  Cervical radiculopathy 09/25/2012     Prior to Admission medications   Medication Sig Start Date End Date Taking? Authorizing Provider  Acetaminophen (TYLENOL ARTHRITIS PAIN PO) Take by mouth.   Yes Historical Provider, MD  b complex vitamins tablet Take 1 tablet by mouth daily.   Yes Historical Provider, MD  Cholecalciferol (VITAMIN D) 2000 units CAPS Take by mouth.   Yes Historical Provider, MD  KRILL OIL PO Take by mouth.   Yes Historical Provider, MD  lisinopril (PRINIVIL,ZESTRIL) 10 MG tablet Take 1 tablet (10 mg total) by mouth daily. 01/16/16  Yes Elby Beck, FNP  meloxicam (MOBIC) 15 MG tablet Take 1 tablet (15 mg total) by mouth daily. 01/16/16  Yes Elby Beck, FNP     Allergies  Allergen Reactions  . Codeine Other (See Comments)    DIZZINESS  . Pravachol [Pravastatin Sodium] Other (See Comments)    Per patient caused headaches and GI upset       Objective:  Physical Exam  Constitutional: She is oriented to person, place, and time. She appears well-developed and well-nourished.  HENT:  Head: Normocephalic and atraumatic.  Right Ear: External ear normal.  Left Ear: External ear normal.  Cardiovascular: Normal rate, regular  rhythm, normal heart sounds and intact distal pulses.   Pulmonary/Chest: Effort normal and breath sounds normal.  Musculoskeletal: She exhibits tenderness. She exhibits no edema or deformity.  Full adduction and abduction. Subacromial pain with light and deep palpation. Left cervical rotation increases tenderness of right shoulder.   Neurological: She is alert and oriented to person, place, and time.  Bilateral neck and shoulder strength against resistance equal and strong. Bilateral hand grips symmetrical  Skin: Skin is warm and dry.   . Vitals:   05/30/16 1344  BP: 126/76  Pulse: 97  Resp: 16  Temp: 98 F (36.7 C)     Assessment & Plan:  1. Right shoulder pain 2. Acute bursitis of right shoulder 3. Neck pain, chronic -  Ambulatory referral to Orthopedic Surgery -Prednisone 20 mg tapered dose 3 PO QAM x3days, 2 PO QAM x3 days -Cyclobenzaprine 10 mg, up to 3 times daily as needed for pain.  Carroll Sage. Kenton Kingfisher, MSN, FNP-C [facility]

## 2016-05-31 ENCOUNTER — Telehealth: Payer: Self-pay

## 2016-05-31 NOTE — Telephone Encounter (Signed)
PA completed on covermymeds for cyclobenzaprine (on formulary but considered high risk med in elderly). Pending.

## 2016-06-03 NOTE — Telephone Encounter (Signed)
PA approved through 10/20/16. Notified pharm.  

## 2016-07-30 DIAGNOSIS — Z23 Encounter for immunization: Secondary | ICD-10-CM | POA: Diagnosis not present

## 2016-08-13 DIAGNOSIS — M542 Cervicalgia: Secondary | ICD-10-CM | POA: Diagnosis not present

## 2016-08-13 DIAGNOSIS — M7551 Bursitis of right shoulder: Secondary | ICD-10-CM | POA: Diagnosis not present

## 2016-08-13 DIAGNOSIS — M5030 Other cervical disc degeneration, unspecified cervical region: Secondary | ICD-10-CM | POA: Diagnosis not present

## 2016-09-09 DIAGNOSIS — M542 Cervicalgia: Secondary | ICD-10-CM | POA: Diagnosis not present

## 2016-09-09 DIAGNOSIS — M5030 Other cervical disc degeneration, unspecified cervical region: Secondary | ICD-10-CM | POA: Diagnosis not present

## 2016-09-09 DIAGNOSIS — M7551 Bursitis of right shoulder: Secondary | ICD-10-CM | POA: Diagnosis not present

## 2016-09-13 DIAGNOSIS — M542 Cervicalgia: Secondary | ICD-10-CM | POA: Diagnosis not present

## 2016-09-13 DIAGNOSIS — M5030 Other cervical disc degeneration, unspecified cervical region: Secondary | ICD-10-CM | POA: Diagnosis not present

## 2016-09-13 DIAGNOSIS — M7551 Bursitis of right shoulder: Secondary | ICD-10-CM | POA: Diagnosis not present

## 2016-09-18 DIAGNOSIS — M5030 Other cervical disc degeneration, unspecified cervical region: Secondary | ICD-10-CM | POA: Diagnosis not present

## 2016-09-18 DIAGNOSIS — M542 Cervicalgia: Secondary | ICD-10-CM | POA: Diagnosis not present

## 2016-09-18 DIAGNOSIS — M7551 Bursitis of right shoulder: Secondary | ICD-10-CM | POA: Diagnosis not present

## 2016-09-20 DIAGNOSIS — M542 Cervicalgia: Secondary | ICD-10-CM | POA: Diagnosis not present

## 2016-09-20 DIAGNOSIS — M7551 Bursitis of right shoulder: Secondary | ICD-10-CM | POA: Diagnosis not present

## 2016-09-20 DIAGNOSIS — M5030 Other cervical disc degeneration, unspecified cervical region: Secondary | ICD-10-CM | POA: Diagnosis not present

## 2016-09-23 DIAGNOSIS — M5030 Other cervical disc degeneration, unspecified cervical region: Secondary | ICD-10-CM | POA: Diagnosis not present

## 2016-09-23 DIAGNOSIS — M7551 Bursitis of right shoulder: Secondary | ICD-10-CM | POA: Diagnosis not present

## 2016-09-23 DIAGNOSIS — M542 Cervicalgia: Secondary | ICD-10-CM | POA: Diagnosis not present

## 2016-09-30 DIAGNOSIS — M7551 Bursitis of right shoulder: Secondary | ICD-10-CM | POA: Diagnosis not present

## 2016-09-30 DIAGNOSIS — M542 Cervicalgia: Secondary | ICD-10-CM | POA: Diagnosis not present

## 2016-09-30 DIAGNOSIS — M5030 Other cervical disc degeneration, unspecified cervical region: Secondary | ICD-10-CM | POA: Diagnosis not present

## 2016-10-04 DIAGNOSIS — M5030 Other cervical disc degeneration, unspecified cervical region: Secondary | ICD-10-CM | POA: Diagnosis not present

## 2016-10-04 DIAGNOSIS — M542 Cervicalgia: Secondary | ICD-10-CM | POA: Diagnosis not present

## 2016-10-04 DIAGNOSIS — M7551 Bursitis of right shoulder: Secondary | ICD-10-CM | POA: Diagnosis not present

## 2016-10-07 DIAGNOSIS — M5030 Other cervical disc degeneration, unspecified cervical region: Secondary | ICD-10-CM | POA: Diagnosis not present

## 2016-10-07 DIAGNOSIS — M7551 Bursitis of right shoulder: Secondary | ICD-10-CM | POA: Diagnosis not present

## 2016-10-07 DIAGNOSIS — M542 Cervicalgia: Secondary | ICD-10-CM | POA: Diagnosis not present

## 2016-10-09 DIAGNOSIS — M5030 Other cervical disc degeneration, unspecified cervical region: Secondary | ICD-10-CM | POA: Diagnosis not present

## 2016-10-09 DIAGNOSIS — M542 Cervicalgia: Secondary | ICD-10-CM | POA: Diagnosis not present

## 2016-10-09 DIAGNOSIS — M7551 Bursitis of right shoulder: Secondary | ICD-10-CM | POA: Diagnosis not present

## 2016-10-17 DIAGNOSIS — M542 Cervicalgia: Secondary | ICD-10-CM | POA: Diagnosis not present

## 2016-10-17 DIAGNOSIS — M5030 Other cervical disc degeneration, unspecified cervical region: Secondary | ICD-10-CM | POA: Diagnosis not present

## 2016-10-17 DIAGNOSIS — M791 Myalgia: Secondary | ICD-10-CM | POA: Diagnosis not present

## 2016-10-17 DIAGNOSIS — M47812 Spondylosis without myelopathy or radiculopathy, cervical region: Secondary | ICD-10-CM | POA: Diagnosis not present

## 2016-10-29 ENCOUNTER — Other Ambulatory Visit: Payer: Self-pay | Admitting: Rehabilitation

## 2016-10-29 DIAGNOSIS — M5030 Other cervical disc degeneration, unspecified cervical region: Secondary | ICD-10-CM

## 2016-11-03 ENCOUNTER — Ambulatory Visit
Admission: RE | Admit: 2016-11-03 | Discharge: 2016-11-03 | Disposition: A | Discharge status: Home / Self Care | Payer: Medicare HMO | Source: Ambulatory Visit | Attending: Rehabilitation | Admitting: Rehabilitation

## 2016-11-03 DIAGNOSIS — M5030 Other cervical disc degeneration, unspecified cervical region: Secondary | ICD-10-CM

## 2016-11-03 DIAGNOSIS — M50221 Other cervical disc displacement at C4-C5 level: Secondary | ICD-10-CM | POA: Diagnosis not present

## 2016-11-03 DIAGNOSIS — M50222 Other cervical disc displacement at C5-C6 level: Secondary | ICD-10-CM | POA: Diagnosis not present

## 2016-11-11 DIAGNOSIS — M47812 Spondylosis without myelopathy or radiculopathy, cervical region: Secondary | ICD-10-CM | POA: Diagnosis not present

## 2016-11-29 DIAGNOSIS — M791 Myalgia: Secondary | ICD-10-CM | POA: Diagnosis not present

## 2016-11-29 DIAGNOSIS — M5030 Other cervical disc degeneration, unspecified cervical region: Secondary | ICD-10-CM | POA: Diagnosis not present

## 2016-11-29 DIAGNOSIS — M47812 Spondylosis without myelopathy or radiculopathy, cervical region: Secondary | ICD-10-CM | POA: Diagnosis not present

## 2016-12-18 DIAGNOSIS — R69 Illness, unspecified: Secondary | ICD-10-CM | POA: Diagnosis not present

## 2016-12-27 DIAGNOSIS — M5030 Other cervical disc degeneration, unspecified cervical region: Secondary | ICD-10-CM | POA: Diagnosis not present

## 2016-12-27 DIAGNOSIS — Z6828 Body mass index (BMI) 28.0-28.9, adult: Secondary | ICD-10-CM | POA: Diagnosis not present

## 2016-12-27 DIAGNOSIS — M47812 Spondylosis without myelopathy or radiculopathy, cervical region: Secondary | ICD-10-CM | POA: Diagnosis not present

## 2016-12-27 DIAGNOSIS — M791 Myalgia: Secondary | ICD-10-CM | POA: Diagnosis not present

## 2017-01-13 DIAGNOSIS — D225 Melanocytic nevi of trunk: Secondary | ICD-10-CM | POA: Diagnosis not present

## 2017-01-13 DIAGNOSIS — L821 Other seborrheic keratosis: Secondary | ICD-10-CM | POA: Diagnosis not present

## 2017-01-13 DIAGNOSIS — L570 Actinic keratosis: Secondary | ICD-10-CM | POA: Diagnosis not present

## 2017-01-13 DIAGNOSIS — X32XXXA Exposure to sunlight, initial encounter: Secondary | ICD-10-CM | POA: Diagnosis not present

## 2017-01-14 ENCOUNTER — Other Ambulatory Visit: Payer: Self-pay | Admitting: Family Medicine

## 2017-01-14 DIAGNOSIS — I10 Essential (primary) hypertension: Secondary | ICD-10-CM

## 2017-02-10 ENCOUNTER — Encounter: Payer: Self-pay | Admitting: Family Medicine

## 2017-02-10 DIAGNOSIS — Z1231 Encounter for screening mammogram for malignant neoplasm of breast: Secondary | ICD-10-CM | POA: Diagnosis not present

## 2017-02-10 DIAGNOSIS — Z853 Personal history of malignant neoplasm of breast: Secondary | ICD-10-CM | POA: Diagnosis not present

## 2017-02-21 DIAGNOSIS — M791 Myalgia: Secondary | ICD-10-CM | POA: Diagnosis not present

## 2017-02-21 DIAGNOSIS — M47812 Spondylosis without myelopathy or radiculopathy, cervical region: Secondary | ICD-10-CM | POA: Diagnosis not present

## 2017-02-21 DIAGNOSIS — M5030 Other cervical disc degeneration, unspecified cervical region: Secondary | ICD-10-CM | POA: Diagnosis not present

## 2017-03-04 ENCOUNTER — Ambulatory Visit (INDEPENDENT_AMBULATORY_CARE_PROVIDER_SITE_OTHER): Payer: Medicare HMO | Admitting: Physician Assistant

## 2017-03-04 ENCOUNTER — Encounter: Payer: Self-pay | Admitting: Physician Assistant

## 2017-03-04 VITALS — BP 116/76 | HR 85 | Temp 98.4°F | Resp 16 | Ht 65.0 in | Wt 169.8 lb

## 2017-03-04 DIAGNOSIS — Z853 Personal history of malignant neoplasm of breast: Secondary | ICD-10-CM

## 2017-03-04 DIAGNOSIS — Z0000 Encounter for general adult medical examination without abnormal findings: Secondary | ICD-10-CM | POA: Diagnosis not present

## 2017-03-04 DIAGNOSIS — E785 Hyperlipidemia, unspecified: Secondary | ICD-10-CM | POA: Diagnosis not present

## 2017-03-04 DIAGNOSIS — M25561 Pain in right knee: Secondary | ICD-10-CM | POA: Diagnosis not present

## 2017-03-04 DIAGNOSIS — Z131 Encounter for screening for diabetes mellitus: Secondary | ICD-10-CM | POA: Diagnosis not present

## 2017-03-04 DIAGNOSIS — E2839 Other primary ovarian failure: Secondary | ICD-10-CM | POA: Diagnosis not present

## 2017-03-04 DIAGNOSIS — I10 Essential (primary) hypertension: Secondary | ICD-10-CM

## 2017-03-04 LAB — POCT URINALYSIS DIP (MANUAL ENTRY)
BILIRUBIN UA: NEGATIVE
BILIRUBIN UA: NEGATIVE mg/dL
Blood, UA: NEGATIVE
GLUCOSE UA: NEGATIVE mg/dL
LEUKOCYTES UA: NEGATIVE
NITRITE UA: NEGATIVE
Protein Ur, POC: NEGATIVE mg/dL
Spec Grav, UA: 1.01 (ref 1.010–1.025)
Urobilinogen, UA: 0.2 E.U./dL
pH, UA: 6.5 (ref 5.0–8.0)

## 2017-03-04 MED ORDER — DICLOFENAC SODIUM 1 % TD GEL: 2.0000 g | Freq: Three times a day (TID) | TRANSDERMAL | 1 refills | Status: DC | PRN

## 2017-03-04 NOTE — Patient Instructions (Addendum)
You may enjoy: Being Mortal by Eda Keys, MD The Bright Hour by Auburn Bilberry    IF you received an x-ray today, you will receive an invoice from [facility]. Please contact [facility] at [phone] with questions or concerns regarding your invoice.   IF you received labwork today, you will receive an invoice from [place]. Please contact [facility] at [phone] with questions or concerns regarding your invoice.   Our billing staff will not be able to assist you with questions regarding bills from these companies.  You will be contacted with the lab results as soon as they are available. The fastest way to get your results is to activate your My Chart account. Instructions are located on the last page of this paperwork. If you have not heard from [place] regarding the results in 2 weeks, please contact this office.    Keeping You Healthy  Get These Tests  Blood Pressure- Have your blood pressure checked by your healthcare provider at least once a year.  Normal blood pressure is 120/80.  Weight- Have your body mass index (BMI) calculated to screen for obesity.  BMI is a measure of body fat based on height and weight.  You can calculate your own BMI at [employer]velB[employer].it  Cholesterol- Have your cholesterol checked every year.  Diabetes- Have your blood sugar checked every year if you have high blood pressure, high cholesterol, a family history of diabetes or if you are overweight.  Pap Test - Have a pap test every 1 to 5 years if you have been sexually active.  If you are older than 65 and recent pap tests have been normal you may not need additional pap tests.  In addition, if you have had a hysterectomy  for benign disease additional pap tests are not necessary.  Mammogram-Yearly mammograms are essential for early detection of breast cancer  Screening for Colon Cancer- Colonoscopy starting at age 24. Screening may begin sooner depending on your family history  and other health conditions.  Follow up colonoscopy as directed by your Gastroenterologist.  Screening for Osteoporosis- Screening begins at age 60 with bone density scanning, sooner if you are at higher risk for developing Osteoporosis.  Get these medicines  Calcium with Vitamin D- Your body requires 1200-1500 mg of Calcium a day and [phone] IU of Vitamin D a day.  You can only absorb 500 mg of Calcium at a time therefore Calcium must be taken in 2 or 3 separate doses throughout the day.  Hormones- Hormone therapy has been associated with increased risk for certain cancers and heart disease.  Talk to your healthcare provider about if you need relief from menopausal symptoms.  Aspirin- Ask your healthcare provider about taking Aspirin to prevent Heart Disease and Stroke.  Get these Immuniztions  Flu shot- Every fall  Pneumonia shot- Once after the age of 81; if you are younger ask your healthcare provider if you need a pneumonia shot.  Tetanus- Every ten years.  Zostavax- Once after the age of 57 to prevent shingles.  Take these steps  Don't smoke- Your healthcare provider can help you quit. For tips on how to quit, ask your healthcare provider or go to www.smokefree.gov or call 1-800 QUIT-NOW.  Be physically active- Exercise 5 days a week for a minimum of 30 minutes.  If you are not already physically active, start slow and gradually work up to 30 minutes of moderate physical activity.  Try walking, dancing, bike riding, swimming, etc.  Eat a healthy  diet- Eat a variety of healthy foods such as fruits, vegetables, whole grains, low fat milk, low fat cheeses, yogurt, lean meats, chicken, fish, eggs, dried beans, tofu, etc.  For more information go to www.thenutritionsource.org  Dental visit- Brush and floss teeth twice daily; visit your dentist twice a year.  Eye exam- Visit your Optometrist or Ophthalmologist yearly.  Drink alcohol in moderation- Limit alcohol intake to one drink  or less a day.  Never drink and drive.  Depression- Your emotional health is as important as your physical health.  If you're feeling down or losing interest in things you normally enjoy, please talk to your healthcare provider.  Seat Belts- can save your life; always wear one  Smoke/Carbon Monoxide detectors- These detectors need to be installed on the appropriate level of your home.  Replace batteries at least once a year.  Violence- If anyone is threatening or hurting you, please tell your healthcare provider.  Living Will/ Health care power of attorney- Discuss with your healthcare provider and family.

## 2017-03-04 NOTE — Progress Notes (Signed)
Presents today for [employer] Visit-Subsequent.   Date of last exam: 01/16/2016  Interpreter used for this visit? no  Patient Care Team: Copland, Gay Filler, MD as PCP - General (Family Medicine) Wonda Horner, MD as Consulting Physician (Gastroenterology)  Last visit with Dr. Lorelei Pont was 03/2015. The patient elected to stay at this practice when Dr. Lorelei Pont left. Had wellness visit last year with another provider.  Other items to address today:   1.RIGHT knee pain Stepped on a gumball and slipped. Over stretched the RIGHT knee. Feels like bursitis she's had before. 4 weeks ago. OTC pain relievers. Initially swollen and irritiated. Now just achy and radiates down tthe leg.  2. Long-standing renal cysts. Brother with polycystic kidney diease. Last year, her GFR was low, and she was advised against taking meloxicam for DDD. To have an epidural injection in the neck this week, uses meloxicam when she needs. Using tumeric and tart cherry juice. Aspercreme with lidocaine provides only minimal benefit.  Review of Systems  Constitutional: Negative.   HENT: Negative.   Eyes: Positive for photophobia.  Respiratory: Negative.   Cardiovascular: Negative.   Gastrointestinal: Negative.   Endocrine: Negative.   Genitourinary: Negative.   Musculoskeletal: Positive for arthralgias, back pain, joint swelling, myalgias, neck pain and neck stiffness.  Skin: Negative.   Allergic/Immunologic: Negative.   Neurological: Negative.   Hematological: Negative.   Psychiatric/Behavioral: Negative.    Cancer Screening: Cervical: s/p hysterectomy Breast: 01/2017, normal, [place] Colon: Last colonoscopy was 2016 with Dr. Penelope Coop.  Prostate: n/a   Other Screening: Last screening for diabetes: 01/16/2016, pre-diabetes  Last lipid screening: 01/16/2016, notable for hyperlipidemia, LDL 182, TG 154, HDL 44  ADVANCE DIRECTIVES: Discussed: yes Patient desires CPR (Yes ), mechanical  ventilation (Yes), prolonged artificial support (may include mechanical ventilation, tube/PEG feeding, etc) (No ). On File: no, asked her to bring them Materials Provided: no  Immunization status:  Immunization History  Administered Date(s) Administered  . Hepatitis B 09/18/2011, 02/10/2012  . Influenza, High Dose Seasonal PF 07/27/2014, 07/13/2015, 07/30/2016  . Influenza-Unspecified 06/21/2013, 07/22/2015  . Pneumococcal Conjugate-13 01/09/2015  . Pneumococcal-Unspecified 08/02/2011  . Tdap 07/22/2011  . Zoster 08/21/2010     Health Maintenance Due  Topic Date Due  . Hepatitis C Screening  Nov 06, 1945     Home Environment: Lives with her husband in a two story home.  Functional Status Survey: Is the patient deaf or have difficulty hearing?: No Does the patient have difficulty seeing, even when wearing glasses/contacts?: Yes Does the patient have difficulty concentrating, remembering, or making decisions?: No Does the patient have difficulty walking or climbing stairs?: No Does the patient have difficulty dressing or bathing?: No Does the patient have difficulty doing errands alone such as visiting a doctor's office or shopping?: No    Urinary Incontinence Screening:  No stress incontinence. Sometimes urgency in the mornings when she first gets up. No other urgency.  Patient Active Problem List   Diagnosis Date Noted  . Breast cancer ([place]) 12/27/2013  . Injury of ulnar collateral ligament of wrist 02/02/2013  . Hypertension 09/25/2012  . Lumbar radiculopathy, chronic 09/25/2012  . Cervical radiculopathy 09/25/2012     Past Medical History:  Diagnosis Date  . Arthritis   . Cancer ([place])   . Cataract   . Hypertension   . Injury of ligament of hand    right thumb ulnar collateral ligament injury.  recommend MR arthrogram of the thumb.  Marland Kitchen Neuromuscular disorder ([place])  scoliosis s/p spine surgery, has arthritis and pinched nerves     Past Surgical History:    Procedure Laterality Date  . ABDOMINAL HYSTERECTOMY  2002   have ovaries  . BREAST SURGERY  2006   left, cancer  . EYE SURGERY  2012  . [place] and 2007  . TUBAL LIGATION       Family History  Problem Relation Age of Onset  . Diabetes Mother        age early 54's  . Heart disease Mother        age early 65's or 31's  . Hypertension Mother   . Hyperlipidemia Mother   . Stroke Father 22       per pt mini  . Diabetes Sister 26       type II  . Hyperlipidemia Sister   . Hypertension Sister   . Kidney disease Brother        polycystic kidney  . Diabetes Brother   . Heart disease Brother   . Alcohol abuse Maternal Grandfather   . Cancer Maternal Grandmother        Leukemia     Social History   Social History  . Marital status: Married    Spouse name: N/A  . Number of children: N/A  . Years of education: N/A   Occupational History  . Retired    Social History Main Topics  . Smoking status: Never Smoker  . Smokeless tobacco: Never Used  . Alcohol use No  . Drug use: No  . Sexual activity: Yes   Other Topics Concern  . Not on file   Social History Narrative   Married   Education: [employer]   Exercise: two times a week     Allergies  Allergen Reactions  . Codeine Other (See Comments)    DIZZINESS  . Pravachol [Pravastatin Sodium] Other (See Comments)    Per patient caused headaches and GI upset     Prior to Admission medications   Medication Sig Start Date End Date Taking? Authorizing Provider  Acetaminophen (TYLENOL ARTHRITIS PAIN PO) Take by mouth.   Yes [provider]  aspirin 81 MG tablet Take 81 mg by mouth daily.   Yes [provider]  Cholecalciferol (VITAMIN D) 2000 units CAPS Take by mouth.   Yes [provider]  KRILL OIL PO Take by mouth.   Yes [provider]  lisinopril (PRINIVIL,ZESTRIL) 10 MG tablet TAKE 1 TABLET(10 MG) BY MOUTH DAILY 01/15/17  Yes Elby Beck, FNP  meloxicam  (MOBIC) 15 MG tablet Take 1 tablet (15 mg total) by mouth daily. 01/16/16  Yes Elby Beck, FNP  Multiple Vitamin (MULTIVITAMIN) tablet Take 1 tablet by mouth daily.   Yes [provider]  OVER THE COUNTER MEDICATION Tart Cherry 135 mg taking BID   Yes [provider]  tizanidine (ZANAFLEX) 2 MG capsule Take 2 mg by mouth 3 (three) times daily.   Yes [provider]  TURMERIC PO Take by mouth.   Yes [provider]  b complex vitamins tablet Take 1 tablet by mouth daily.    [provider]  cyclobenzaprine (FLEXERIL) 10 MG tablet Take 1 tablet (10 mg total) by mouth 3 (three) times daily as needed for muscle spasms. Patient not taking: Reported on 03/04/2017 05/30/16   Scot Jun, FNP     Depression screen [facility] 2/9 05/30/2016 01/16/2016 01/09/2015 12/27/2013  Decreased Interest 0 0 0 0  Down, Depressed, Hopeless 0 0 0 0  PHQ - 2 Score 0 0 0 0     Fall Risk  05/30/2016 01/16/2016 01/09/2015 12/27/2013  Falls in the past year? No No No Yes  Number falls in past yr: - - - 1  Injury with Fall? - - - Yes      PHYSICAL EXAM: BP 116/76   Pulse 85   Temp 98.4 F (36.9 C) (Oral)   Resp 16   Ht 5\' 5"  (1.651 m)   Wt 169 lb 12.8 oz (77 kg)   SpO2 98%   BMI 28.26 kg/m    Wt Readings from Last 3 Encounters:  03/04/17 169 lb 12.8 oz (77 kg)  05/30/16 165 lb 9.6 oz (75.1 kg)  01/16/16 162 lb 12.8 oz (73.8 kg)      Visual Acuity Screening   Right eye Left eye Both eyes  Without correction: 20/40 20/30 20/25   With correction:         Physical Exam  Constitutional: She is oriented to person, place, and time. She appears well-developed and well-nourished. She is active and cooperative. No distress.  HENT:  Head: Normocephalic and atraumatic.  Right Ear: Hearing, tympanic membrane, external ear and ear canal normal.  Left Ear: Hearing, tympanic membrane, external ear and ear canal normal.  Nose: Nose normal.  Mouth/Throat: Uvula is  midline, oropharynx is clear and moist and mucous membranes are normal. No oral lesions. No uvula swelling. No oropharyngeal exudate.  Eyes: Conjunctivae, EOM and lids are normal. Pupils are equal, round, and reactive to light. Right eye exhibits no discharge. Left eye exhibits no discharge. No scleral icterus.  Fundoscopic exam:      The right eye shows no hemorrhage and no papilledema. The right eye shows red reflex.       The left eye shows no hemorrhage and no papilledema. The left eye shows red reflex.  Neck: Normal range of motion, full passive range of motion without pain and phonation normal. Neck supple. No thyromegaly present.  Cardiovascular: Normal rate, regular rhythm, normal heart sounds and intact distal pulses.  Exam reveals no gallop and no friction rub.   No murmur heard. Respiratory: Effort normal and breath sounds normal. Right breast exhibits no inverted nipple, no mass, no nipple discharge, no skin change and no tenderness. Left breast exhibits no inverted nipple, no mass, no nipple discharge, no skin change and no tenderness. Breasts are symmetrical.  GI: Soft. Normal appearance and bowel sounds are normal. There is no hepatosplenomegaly. There is no tenderness.  Musculoskeletal:       Cervical back: Normal.       Thoracic back: Normal.       Lumbar back: Normal.  Lymphadenopathy:       Head (right side): No submandibular and no tonsillar adenopathy present.       Head (left side): No submandibular and no tonsillar adenopathy present.    She has no cervical adenopathy.       Right: No supraclavicular adenopathy present.       Left: No supraclavicular adenopathy present.  Neurological: She is alert and oriented to person, place, and time. She has normal strength. No cranial nerve deficit or sensory deficit.  Skin: Skin is warm and dry. No rash noted. She is not diaphoretic.  Psychiatric: She has a normal mood and affect. Her speech is normal and behavior is normal.  Judgment and thought content normal. Cognition and memory are normal.  Education/Counseling provided regarding diet and exercise, prevention of chronic diseases, smoking/tobacco cessation, if applicable, and reviewed "Covered Medicare Preventive Services."   ASSESSMENT/PLAN: Problem List Items Addressed This Visit    Hypertension   Relevant Medications   aspirin 81 MG tablet   Other Relevant Orders   CBC with Differential/Platelet (Completed)   TSH (Completed)   T4, free (Completed)   POCT urinalysis dipstick (Completed)   History of left breast cancer    In remission. Annual screening mammography recommended.      Hyperlipidemia   Relevant Medications   aspirin 81 MG tablet   Other Relevant Orders   Lipid panel (Completed)    Other Visit Diagnoses    Medicare annual wellness visit, subsequent    -  Primary   Relevant Orders   Care order/instruction: (Completed)   Acute pain of right knee       Relevant Medications   diclofenac sodium (VOLTAREN) 1 % GEL   Estrogen deficiency       Relevant Orders   DG Bone Density   Screening for diabetes mellitus       Relevant Orders   Hemoglobin A1c (Completed)       Return in about 6 months (around 09/04/2017), or if knee pain worsens or fails to improve, for re-evaluation of blood pressure and cholesterol.   Fara Chute, PA-C [facility] [place]

## 2017-03-04 NOTE — Progress Notes (Signed)
   Subjective:    Patient ID: [name], female    DOB: 1946-09-02, 71 y.o.   MRN: [id number]  HPI    Review of Systems  Constitutional: Negative.   HENT: Negative.   Eyes: Positive for photophobia.  Respiratory: Negative.   Cardiovascular: Negative.   Gastrointestinal: Negative.   Endocrine: Negative.   Genitourinary: Negative.   Musculoskeletal: Positive for arthralgias, back pain, joint swelling, myalgias, neck pain and neck stiffness.  Skin: Negative.   Allergic/Immunologic: Negative.   Neurological: Negative.   Hematological: Negative.   Psychiatric/Behavioral: Negative.        Objective:   Physical Exam        Assessment & Plan:

## 2017-03-05 ENCOUNTER — Encounter: Payer: Self-pay | Admitting: Physician Assistant

## 2017-03-05 LAB — HEMOGLOBIN A1C
ESTIMATED AVERAGE GLUCOSE: 108 mg/dL
HEMOGLOBIN A1C: 5.4 % (ref 4.8–5.6)

## 2017-03-05 LAB — CBC WITH DIFFERENTIAL/PLATELET
BASOS ABS: 0 10*3/uL (ref 0.0–0.2)
Basos: 0 %
EOS (ABSOLUTE): 0.1 10*3/uL (ref 0.0–0.4)
Eos: 1 %
Hematocrit: 40.6 % (ref 34.0–46.6)
Hemoglobin: 12.9 g/dL (ref 11.1–15.9)
Immature Grans (Abs): 0 10*3/uL (ref 0.0–0.1)
Immature Granulocytes: 0 %
LYMPHS ABS: 2.7 10*3/uL (ref 0.7–3.1)
Lymphs: 38 %
MCH: 29.9 pg (ref 26.6–33.0)
MCHC: 31.8 g/dL (ref 31.5–35.7)
MCV: 94 fL (ref 79–97)
Monocytes Absolute: 0.4 10*3/uL (ref 0.1–0.9)
Monocytes: 5 %
NEUTROS ABS: 3.9 10*3/uL (ref 1.4–7.0)
Neutrophils: 56 %
PLATELETS: 259 10*3/uL (ref 150–379)
RBC: 4.31 x10E6/uL (ref 3.77–5.28)
RDW: 13.1 % (ref 12.3–15.4)
WBC: 7 10*3/uL (ref 3.4–10.8)

## 2017-03-05 LAB — LIPID PANEL
CHOL/HDL RATIO: 5.4 ratio — AB (ref 0.0–4.4)
Cholesterol, Total: 248 mg/dL — ABNORMAL HIGH (ref 100–199)
HDL: 46 mg/dL (ref >39)
LDL Calculated: 157 mg/dL — ABNORMAL HIGH (ref 0–99)
Triglycerides: 226 mg/dL — ABNORMAL HIGH (ref 0–149)
VLDL Cholesterol Cal: 45 mg/dL — ABNORMAL HIGH (ref 5–40)

## 2017-03-05 LAB — T4, FREE: FREE T4: 1.17 ng/dL (ref 0.82–1.77)

## 2017-03-05 LAB — TSH: TSH: 1.18 u[IU]/mL (ref 0.450–4.500)

## 2017-03-06 ENCOUNTER — Encounter: Payer: Self-pay | Admitting: Physician Assistant

## 2017-03-06 DIAGNOSIS — E785 Hyperlipidemia, unspecified: Secondary | ICD-10-CM | POA: Insufficient documentation

## 2017-03-06 NOTE — Assessment & Plan Note (Signed)
In remission. Annual screening mammography recommended.

## 2017-03-07 ENCOUNTER — Telehealth: Payer: Self-pay | Admitting: Physician Assistant

## 2017-03-07 DIAGNOSIS — I10 Essential (primary) hypertension: Secondary | ICD-10-CM

## 2017-03-07 DIAGNOSIS — M5010 Cervical disc disorder with radiculopathy, unspecified cervical region: Secondary | ICD-10-CM

## 2017-03-07 NOTE — Telephone Encounter (Signed)
Pt is needing to get a refill on lisinopril and meloxican And checking her lab results  Best number [phone]

## 2017-03-08 MED ORDER — LISINOPRIL 10 MG PO TABS: ORAL_TABLET | ORAL | 3 refills | Status: DC

## 2017-03-08 MED ORDER — MELOXICAM 15 MG PO TABS: 15.0000 mg | ORAL_TABLET | Freq: Every day | ORAL | 3 refills | Status: DC

## 2017-03-08 NOTE — Telephone Encounter (Signed)
Meds ordered this encounter  Medications  . lisinopril (PRINIVIL,ZESTRIL) 10 MG tablet    Sig: TAKE 1 TABLET(10 MG) BY MOUTH DAILY    Dispense:  90 tablet    Refill:  3  . meloxicam (MOBIC) 15 MG tablet    Sig: Take 1 tablet (15 mg total) by mouth daily.    Dispense:  90 tablet    Refill:  3    I sent her a message about her lab results in My Chart on 03/05/2017. Cholesterol is elevated, and we generally recommend statins for this. She did not tolerate pravastatin, so our options are: 1. Try another statin, or 2. Try a different type of medication: Niaspan, fenofibrate, Welchol, Zetia

## 2017-03-10 ENCOUNTER — Encounter: Payer: Self-pay | Admitting: Physician Assistant

## 2017-03-10 DIAGNOSIS — M5030 Other cervical disc degeneration, unspecified cervical region: Secondary | ICD-10-CM | POA: Diagnosis not present

## 2017-03-11 ENCOUNTER — Encounter: Payer: Self-pay | Admitting: Physician Assistant

## 2017-03-11 NOTE — Telephone Encounter (Signed)
I intended to order a CMET on this patient on 5/19, but apparently did not. Can that be added to the blood already drawn?  DX: HTN, Hyperlipidemia

## 2017-04-02 NOTE — Telephone Encounter (Signed)
YES PLEASE

## 2017-04-08 DIAGNOSIS — M5030 Other cervical disc degeneration, unspecified cervical region: Secondary | ICD-10-CM | POA: Diagnosis not present

## 2017-04-08 DIAGNOSIS — M542 Cervicalgia: Secondary | ICD-10-CM | POA: Diagnosis not present

## 2017-04-08 DIAGNOSIS — Z6833 Body mass index (BMI) 33.0-33.9, adult: Secondary | ICD-10-CM | POA: Diagnosis not present

## 2017-04-08 DIAGNOSIS — M47812 Spondylosis without myelopathy or radiculopathy, cervical region: Secondary | ICD-10-CM | POA: Diagnosis not present

## 2017-04-28 DIAGNOSIS — M47812 Spondylosis without myelopathy or radiculopathy, cervical region: Secondary | ICD-10-CM | POA: Diagnosis not present

## 2017-04-28 DIAGNOSIS — M791 Myalgia: Secondary | ICD-10-CM | POA: Diagnosis not present

## 2017-04-28 DIAGNOSIS — M5030 Other cervical disc degeneration, unspecified cervical region: Secondary | ICD-10-CM | POA: Diagnosis not present

## 2017-06-05 ENCOUNTER — Ambulatory Visit (INDEPENDENT_AMBULATORY_CARE_PROVIDER_SITE_OTHER): Payer: Medicare HMO | Admitting: Podiatry

## 2017-06-05 ENCOUNTER — Encounter: Payer: Self-pay | Admitting: Podiatry

## 2017-06-05 ENCOUNTER — Ambulatory Visit (INDEPENDENT_AMBULATORY_CARE_PROVIDER_SITE_OTHER): Payer: Medicare HMO

## 2017-06-05 VITALS — BP 110/75 | HR 87

## 2017-06-05 DIAGNOSIS — M722 Plantar fascial fibromatosis: Secondary | ICD-10-CM

## 2017-06-05 DIAGNOSIS — B351 Tinea unguium: Secondary | ICD-10-CM | POA: Diagnosis not present

## 2017-06-05 DIAGNOSIS — M2041 Other hammer toe(s) (acquired), right foot: Secondary | ICD-10-CM

## 2017-06-05 DIAGNOSIS — M2042 Other hammer toe(s) (acquired), left foot: Secondary | ICD-10-CM

## 2017-06-05 DIAGNOSIS — M2011 Hallux valgus (acquired), right foot: Secondary | ICD-10-CM

## 2017-06-05 NOTE — Patient Instructions (Signed)
Look at getting a "Powerstep" or a "superfeet" insert for your shoes.   It was nice to meet you today.    Plantar Fasciitis Rehab Ask your health care provider which exercises are safe for you. Do exercises exactly as told by your health care provider and adjust them as directed. It is normal to feel mild stretching, pulling, tightness, or discomfort as you do these exercises, but you should stop right away if you feel sudden pain or your pain gets worse. Do not begin these exercises until told by your health care provider. Stretching and range of motion exercises These exercises warm up your muscles and joints and improve the movement and flexibility of your foot. These exercises also help to relieve pain. Exercise A: Plantar fascia stretch  1. Sit with your left / right leg crossed over your opposite knee. 2. Hold your heel with one hand with that thumb near your arch. With your other hand, hold your toes and gently pull them back toward the top of your foot. You should feel a stretch on the bottom of your toes or your foot or both. 3. Hold this stretch for__________ seconds. 4. Slowly release your toes and return to the starting position. Repeat __________ times. Complete this exercise __________ times a day. Exercise B: Gastroc, standing  1. Stand with your hands against a wall. 2. Extend your left / right leg behind you, and bend your front knee slightly. 3. Keeping your heels on the floor and keeping your back knee straight, shift your weight toward the wall without arching your back. You should feel a gentle stretch in your left / right calf. 4. Hold this position for __________ seconds. Repeat __________ times. Complete this exercise __________ times a day. Exercise C: Soleus, standing 1. Stand with your hands against a wall. 2. Extend your left / right leg behind you, and bend your front knee slightly. 3. Keeping your heels on the floor, bend your back knee and slightly shift your  weight over the back leg. You should feel a gentle stretch deep in your calf. 4. Hold this position for __________ seconds. Repeat __________ times. Complete this exercise __________ times a day. Exercise D: Gastrocsoleus, standing 1. Stand with the [place] of your left / right foot on a step. The [place] of your foot is on the walking surface, right under your toes. 2. Keep your other foot firmly on the same step. 3. Hold onto the wall or a railing for balance. 4. Slowly lift your other foot, allowing your body weight to press your heel down over the edge of the step. You should feel a stretch in your left / right calf. 5. Hold this position for __________ seconds. 6. Return both feet to the step. 7. Repeat this exercise with a slight bend in your left / right knee. Repeat __________ times with your left / right knee straight and __________ times with your left / right knee bent. Complete this exercise __________ times a day. Balance exercise This exercise builds your balance and strength control of your arch to help take pressure off your plantar fascia. Exercise E: Single leg stand 1. Without shoes, stand near a railing or in a doorway. You may hold onto the railing or door frame as needed. 2. Stand on your left / right foot. Keep your big toe down on the floor and try to keep your arch lifted. Do not let your foot roll inward. 3. Hold this position for __________ seconds. 4.  If this exercise is too easy, you can try it with your eyes closed or while standing on a pillow. Repeat __________ times. Complete this exercise __________ times a day. This information is not intended to replace advice given to you by your health care provider. Make sure you discuss any questions you have with your health care provider. Document Released: 10/07/2005 Document Revised: 06/11/2016 Document Reviewed: 08/21/2015 Elsevier Interactive Patient Education  2018 [employer].

## 2017-06-05 NOTE — Progress Notes (Signed)
   Subjective:    Patient ID: [name], female    DOB: 08-Aug-1946, 71 y.o.   MRN: [id number]  HPI  Chief Complaint  Patient presents with  . Hammer Toe    Right, 2nd toe   . Bunions    Right foot, medial side   . Nail Problem    Right, hallux - discolored    70 year old female presents the office today for concerns of painful bunion on the right foot as well as a hammertoe. She is in ongoing for some time but her main concern she's getting pain in the arch of her foot just below the bunion site and she points the medial arch of the foot. She states this has been ongoing for some time. She states that the right big toenail is also thick and discolored as his been ongoing the last 3 years of that she drops and on her foot. She states the nails are starting to get caught on things and causing pain. She has no other concerns today. She is a recent treatment for any resistance issues  Review of Systems  Constitutional: Negative.   Musculoskeletal: Positive for arthralgias, back pain and myalgias.       Difficulty walking   All other systems reviewed and are negative.      Objective:   Physical Exam General: AAO x3, NAD  Dermatological: The right hallux toenail is dystrophic, discolored-year-old brown discoloration is loosely underlying nailbed distally but firmly adhered proximally. Minimal discomfort at the tip of the toenail is loose. There is no surrounding edema, erythema, drainage or pus there is no clinical signs of infection present. There is no other open lesions or pre-ulcer lesions identified today.  Vascular: Dorsalis Pedis artery and Posterior Tibial artery pedal pulses are 2/4 bilateral with immedate capillary fill time.  There is no pain with calf compression, swelling, warmth, erythema.   Neruologic: Grossly intact via light touch bilateral.  Protective threshold with Semmes Wienstein monofilament intact to all pedal sites bilateral.   Musculoskeletal: HAV and hammertoe  deformities are present bilaterally. The second digit starting to overlap the hallux. The majority of tenderness today appears to be localized to the medial band of the plantar fascial in the arch the foot as well as diffusely just proximal to the sesamoid complex. The plantar fasciitis appears to be intact. There is no overlying edema, erythema, increase in warmth. There is no area pinpoint bony tenderness or pain the vibratory sensation. Muscular strength 5/5 in all groups tested bilateral.  Gait: Unassisted, Nonantalgic.      Assessment & Plan:  71 year old female with plantar fasciitis, HAV hammertoes as well as onycholysis. -Treatment options discussed including all alternatives, risks, and complications -Etiology of symptoms were discussed -X-rays were obtained and reviewed with the patient. HAV hammertoes are present. Is no evidence of acute fracture identified today. -Majority of her symptoms are along the plantar fascia. Discussed with her stretching, icing exercises as well as arch supports. Location of the plantar fascia where it is hurting her hold off on steroid injection. -Discussed the total nail avulsion of the right hallux toenail which she wished off. I did sharp debridement the nail to healthy toenail without any complications or bleeding. If symptoms continue she needs to have this done. I also sent this nail for culture. -Discussed shoe gear modifications as well  Celesta Gentile, DPM

## 2017-06-06 DIAGNOSIS — L609 Nail disorder, unspecified: Secondary | ICD-10-CM | POA: Diagnosis not present

## 2017-06-06 DIAGNOSIS — B351 Tinea unguium: Secondary | ICD-10-CM | POA: Diagnosis not present

## 2017-06-26 ENCOUNTER — Telehealth: Payer: Self-pay | Admitting: *Deleted

## 2017-06-26 NOTE — Telephone Encounter (Addendum)
-----   Message from Trula Slade, DPM sent at 06/18/2017  2:48 PM EDT ----- Negative for fungus. She can try urea. Please let her know. Thanks.09/60/2018-Left message informing pt of Dr. Leigh Aurora review of result and orders.

## 2017-07-29 DIAGNOSIS — Z23 Encounter for immunization: Secondary | ICD-10-CM | POA: Diagnosis not present

## 2017-08-21 DIAGNOSIS — M5412 Radiculopathy, cervical region: Secondary | ICD-10-CM | POA: Diagnosis not present

## 2017-08-21 DIAGNOSIS — M542 Cervicalgia: Secondary | ICD-10-CM | POA: Diagnosis not present

## 2017-08-21 DIAGNOSIS — M7918 Myalgia, other site: Secondary | ICD-10-CM | POA: Diagnosis not present

## 2017-09-05 ENCOUNTER — Ambulatory Visit: Payer: Medicare HMO | Admitting: Physician Assistant

## 2017-09-18 DIAGNOSIS — I10 Essential (primary) hypertension: Secondary | ICD-10-CM | POA: Diagnosis not present

## 2017-09-18 DIAGNOSIS — M542 Cervicalgia: Secondary | ICD-10-CM | POA: Diagnosis not present

## 2017-09-18 DIAGNOSIS — M791 Myalgia, unspecified site: Secondary | ICD-10-CM | POA: Diagnosis not present

## 2017-09-18 DIAGNOSIS — Z6828 Body mass index (BMI) 28.0-28.9, adult: Secondary | ICD-10-CM | POA: Diagnosis not present

## 2017-10-08 DIAGNOSIS — M5030 Other cervical disc degeneration, unspecified cervical region: Secondary | ICD-10-CM | POA: Diagnosis not present

## 2017-10-22 DIAGNOSIS — R69 Illness, unspecified: Secondary | ICD-10-CM | POA: Diagnosis not present

## 2017-11-07 DIAGNOSIS — M5412 Radiculopathy, cervical region: Secondary | ICD-10-CM | POA: Diagnosis not present

## 2017-11-07 DIAGNOSIS — M542 Cervicalgia: Secondary | ICD-10-CM | POA: Diagnosis not present

## 2018-01-26 ENCOUNTER — Encounter: Payer: Self-pay | Admitting: Physician Assistant

## 2018-02-20 DIAGNOSIS — Z853 Personal history of malignant neoplasm of breast: Secondary | ICD-10-CM | POA: Diagnosis not present

## 2018-02-20 DIAGNOSIS — Z1231 Encounter for screening mammogram for malignant neoplasm of breast: Secondary | ICD-10-CM | POA: Diagnosis not present

## 2018-02-20 LAB — HM MAMMOGRAPHY

## 2018-02-23 ENCOUNTER — Encounter: Payer: Self-pay | Admitting: *Deleted

## 2018-03-02 ENCOUNTER — Other Ambulatory Visit: Payer: Self-pay | Admitting: Physician Assistant

## 2018-03-02 DIAGNOSIS — I10 Essential (primary) hypertension: Secondary | ICD-10-CM

## 2018-03-19 DIAGNOSIS — X32XXXD Exposure to sunlight, subsequent encounter: Secondary | ICD-10-CM | POA: Diagnosis not present

## 2018-03-19 DIAGNOSIS — L570 Actinic keratosis: Secondary | ICD-10-CM | POA: Diagnosis not present

## 2018-03-19 DIAGNOSIS — D225 Melanocytic nevi of trunk: Secondary | ICD-10-CM | POA: Diagnosis not present

## 2018-04-02 ENCOUNTER — Encounter: Payer: Self-pay | Admitting: Family Medicine

## 2018-04-02 ENCOUNTER — Ambulatory Visit (INDEPENDENT_AMBULATORY_CARE_PROVIDER_SITE_OTHER): Payer: Medicare HMO | Admitting: Family Medicine

## 2018-04-02 ENCOUNTER — Other Ambulatory Visit: Payer: Self-pay

## 2018-04-02 VITALS — BP 119/80 | HR 87 | Temp 98.5°F | Resp 17 | Ht 65.0 in | Wt 170.8 lb

## 2018-04-02 DIAGNOSIS — M5010 Cervical disc disorder with radiculopathy, unspecified cervical region: Secondary | ICD-10-CM

## 2018-04-02 DIAGNOSIS — I10 Essential (primary) hypertension: Secondary | ICD-10-CM | POA: Diagnosis not present

## 2018-04-02 DIAGNOSIS — E782 Mixed hyperlipidemia: Secondary | ICD-10-CM | POA: Diagnosis not present

## 2018-04-02 DIAGNOSIS — Z0000 Encounter for general adult medical examination without abnormal findings: Secondary | ICD-10-CM | POA: Diagnosis not present

## 2018-04-02 LAB — COMPREHENSIVE METABOLIC PANEL
A/G RATIO: 1.8 (ref 1.2–2.2)
ALK PHOS: 53 IU/L (ref 39–117)
ALT: 18 IU/L (ref 0–32)
AST: 18 IU/L (ref 0–40)
Albumin: 4.5 g/dL (ref 3.5–4.8)
BUN / CREAT RATIO: 15 (ref 12–28)
BUN: 14 mg/dL (ref 8–27)
Bilirubin Total: 0.4 mg/dL (ref 0.0–1.2)
CO2: 22 mmol/L (ref 20–29)
Calcium: 9.9 mg/dL (ref 8.7–10.3)
Chloride: 105 mmol/L (ref 96–106)
Creatinine, Ser: 0.92 mg/dL (ref 0.57–1.00)
GFR calc Af Amer: 72 mL/min/{1.73_m2} (ref >59)
GFR, EST NON AFRICAN AMERICAN: 63 mL/min/{1.73_m2} (ref >59)
GLOBULIN, TOTAL: 2.5 g/dL (ref 1.5–4.5)
Glucose: 103 mg/dL — ABNORMAL HIGH (ref 65–99)
POTASSIUM: 5.1 mmol/L (ref 3.5–5.2)
Sodium: 140 mmol/L (ref 134–144)
Total Protein: 7 g/dL (ref 6.0–8.5)

## 2018-04-02 LAB — LIPID PANEL
CHOL/HDL RATIO: 5.4 ratio — AB (ref 0.0–4.4)
CHOLESTEROL TOTAL: 237 mg/dL — AB (ref 100–199)
HDL: 44 mg/dL (ref >39)
LDL Calculated: 164 mg/dL — ABNORMAL HIGH (ref 0–99)
Triglycerides: 144 mg/dL (ref 0–149)
VLDL Cholesterol Cal: 29 mg/dL (ref 5–40)

## 2018-04-02 MED ORDER — LISINOPRIL 10 MG PO TABS: 10.0000 mg | ORAL_TABLET | Freq: Every day | ORAL | 3 refills | Status: DC

## 2018-04-02 MED ORDER — TIZANIDINE HCL 2 MG PO CAPS: 2.0000 mg | ORAL_CAPSULE | Freq: Three times a day (TID) | ORAL | 1 refills | Status: DC | PRN

## 2018-04-02 MED ORDER — MELOXICAM 15 MG PO TABS: 15.0000 mg | ORAL_TABLET | Freq: Every day | ORAL | 3 refills | Status: DC

## 2018-04-02 NOTE — Progress Notes (Signed)
QUICK REFERENCE INFORMATION: The ABCs of Providing the Annual Wellness Visit  CMS.gov Medicare Learning Network  [employer] Annual Wellness Visit  Subjective:   [name] is a 72 y.o. Female who presents for an Annual Wellness Visit.  Patient Active Problem List   Diagnosis Date Noted  . Hyperlipidemia 03/06/2017  . History of left breast cancer 12/27/2013  . Injury of ulnar collateral ligament of wrist 02/02/2013  . Hypertension 09/25/2012  . Lumbar radiculopathy, chronic 09/25/2012  . Cervical radiculopathy 09/25/2012    Past Medical History:  Diagnosis Date  . Arthritis   . Cancer ([place])   . Cataract   . Hypertension   . Injury of ligament of hand    right thumb ulnar collateral ligament injury.  recommend MR arthrogram of the thumb.  Marland Kitchen Neuromuscular disorder ([place])    scoliosis s/p spine surgery, has arthritis and pinched nerves     Past Surgical History:  Procedure Laterality Date  . ABDOMINAL HYSTERECTOMY  2002   have ovaries  . BREAST SURGERY  2006   left, cancer  . EYE SURGERY  2012  . [place] and 2007  . TUBAL LIGATION       Outpatient Medications Prior to Visit  Medication Sig Dispense Refill  . Acetaminophen (TYLENOL ARTHRITIS PAIN PO) Take by mouth.    Marland Kitchen aspirin 81 MG tablet Take 81 mg by mouth daily.    Marland Kitchen b complex vitamins tablet Take 1 tablet by mouth daily.    . Cholecalciferol (VITAMIN D) 2000 units CAPS Take by mouth.    Marland Kitchen KRILL OIL PO Take by mouth.    . Multiple Vitamin (MULTIVITAMIN) tablet Take 1 tablet by mouth daily.    Marland Kitchen OVER THE COUNTER MEDICATION Tart Cherry 135 mg taking BID    . TURMERIC PO Take by mouth.    Marland Kitchen lisinopril (PRINIVIL,ZESTRIL) 10 MG tablet TAKE 1 TABLET(10 MG) BY MOUTH DAILY 90 tablet 3  . meloxicam (MOBIC) 15 MG tablet Take 1 tablet (15 mg total) by mouth daily. 90 tablet 3  . tizanidine (ZANAFLEX) 2 MG capsule Take 2 mg by mouth 3 (three) times daily.    . cyclobenzaprine (FLEXERIL) 10 MG tablet Take 1  tablet (10 mg total) by mouth 3 (three) times daily as needed for muscle spasms. (Patient not taking: Reported on 03/04/2017) 30 tablet 0  . diclofenac sodium (VOLTAREN) 1 % GEL Apply 2 g topically 3 (three) times daily as needed. (Patient not taking: Reported on 04/02/2018) 100 g 1   No facility-administered medications prior to visit.     Allergies  Allergen Reactions  . Codeine Other (See Comments)    DIZZINESS  . Pravachol [Pravastatin Sodium] Other (See Comments)    Per patient caused headaches and GI upset     Family History  Problem Relation Age of Onset  . Diabetes Mother        age early 90's  . Heart disease Mother        age early 29's or 19's  . Hypertension Mother   . Hyperlipidemia Mother   . Stroke Father 38       per pt mini  . Diabetes Sister 58       type II  . Hyperlipidemia Sister   . Hypertension Sister   . Kidney disease Brother        polycystic kidney  . Diabetes Brother   . Heart disease Brother   . Alcohol abuse Maternal Grandfather   .  Cancer Maternal Grandmother        Leukemia  . Cancer Paternal Aunt 48       Breast     Social History   Socioeconomic History  . Marital status: Married    Spouse name: Not on file  . Number of children: Not on file  . Years of education: Not on file  . Highest education level: Not on file  Occupational History  . Occupation: Retired  Scientific laboratory technician  . Financial resource strain: Not on file  . Food insecurity:    Worry: Not on file    Inability: Not on file  . Transportation needs:    Medical: Not on file    Non-medical: Not on file  Tobacco Use  . Smoking status: Never Smoker  . Smokeless tobacco: Never Used  Substance and Sexual Activity  . Alcohol use: No  . Drug use: No  . Sexual activity: Yes  Lifestyle  . Physical activity:    Days per week: Not on file    Minutes per session: Not on file  . Stress: Not on file  Relationships  . Social connections:    Talks on phone: Not on file     Gets together: Not on file    Attends religious service: Not on file    Active member of club or organization: Not on file    Attends meetings of clubs or organizations: Not on file    Relationship status: Not on file  Other Topics Concern  . Not on file  Social History Narrative   Married   Education: [employer]   Exercise: two times a week      Recent Hospitalizations? No  Health Habits: Current exercise activities include: aerobics and swimming Exercise: 5 times/week. Diet: in general, a "healthy" diet    Alcohol intake: no  Health Risk Assessment: The patient has completed a Health Risk Assessment. This has been reveiwed with them and has been scanned into the [facility] system as an attached document.  Current Medical Providers and Suppliers: [facility] Patient Care Team: Harrison Mons, PA-C as PCP - General (Family Medicine) Wonda Horner, MD as Consulting Physician (Gastroenterology) Zonia Kief, MD as Attending Physician (Rehabilitation) No future appointments.   Age-appropriate Screening Schedule: The list below includes current immunization status and future screening recommendations based on patient's age. Orders for these recommended tests are listed in the plan section. The patient has been provided with a written plan. Immunization History  Administered Date(s) Administered  . Hepatitis B 09/18/2011, 02/10/2012  . Influenza, High Dose Seasonal PF 07/27/2014, 07/13/2015, 07/30/2016  . Influenza-Unspecified 06/21/2013, 07/22/2015  . Pneumococcal Conjugate-13 01/09/2015  . Pneumococcal-Unspecified 08/02/2011  . Tdap 07/22/2011  . Zoster 08/21/2010    Health Maintenance reviewed -  Mammogram done    Depression Screen-PHQ2/9 completed today  Depression screen [facility] 2/9 04/02/2018 05/30/2016 01/16/2016 01/09/2015 12/27/2013  Decreased Interest 0 0 0 0 0  Down, Depressed, Hopeless 0 0 0 0 0  PHQ - 2 Score 0 0 0 0 0    Depression Severity and Treatment  Recommendations:  0-4= None  5-9= Mild / Treatment: Support, educate to call if worse; return in one month  10-14= Moderate / Treatment: Support, watchful waiting; Antidepressant or Psycotherapy  15-19= Moderately severe / Treatment: Antidepressant OR Psychotherapy  >= 20 = Major depression, severe / Antidepressant AND Psychotherapy  Functional Status Survey:   Is the patient deaf or have difficulty hearing?: No Does the patient have difficulty seeing,  even when wearing glasses/contacts?: No Does the patient have difficulty concentrating, remembering, or making decisions?: No Does the patient have difficulty walking or climbing stairs?: No Does the patient have difficulty dressing or bathing?: No Does the patient have difficulty doing errands alone such as visiting a doctor's office or shopping?: No  Hearing Evaluation: 1. Do you have trouble hearing the television when others do not?   No 2. Do you have to strain to hear/understand conversations? No   Advanced Care Planning: 1. Patient has executed an Advance Directive: Yes 2. If no, patient was given the opportunity to execute an Advance Directive today? Yes 3. Are the patient's advanced directives in [place]? No 4. This patient has the ability to prepare an Advance Directive: Yes 5. Provider is willing to follow the patient's wishes: Yes  Cognitive Assessment: Does the patient have evidence of cognitive impairment? No The patient does not have any evidence of any cognitive problems and denies any  change in mood/affect, appearance, speech, memory or motor skills.  Identification of Risk Factors: Risk factors include: hyperlipidemia and hypertension  Review of Systems  Constitutional: Negative for chills and fever.  Eyes: Negative for blurred vision and double vision.  Respiratory: Negative for cough, shortness of breath and wheezing.   Cardiovascular: Negative for chest pain, palpitations and leg swelling.  Gastrointestinal:  Negative for abdominal pain, nausea and vomiting.  Genitourinary: Negative for dysuria and urgency.  Skin: Negative for itching and rash.  Neurological: Negative for dizziness, tingling and headaches.  Psychiatric/Behavioral: Negative for depression. The patient is not nervous/anxious.     Objective:   Vitals:   04/02/18 0833  BP: 119/80  Pulse: 87  Resp: 17  Temp: 98.5 F (36.9 C)  TempSrc: Oral  SpO2: 97%  Weight: 170 lb 12.8 oz (77.5 kg)  Height: 5\' 5"  (1.651 m)    Body mass index is 28.42 kg/m.  Physical Exam  Constitutional: She is oriented to person, place, and time. She appears well-developed and well-nourished.  HENT:  Head: Normocephalic and atraumatic.  Right Ear: External ear normal.  Left Ear: External ear normal.  Eyes: Conjunctivae and EOM are normal.  Neck: Normal range of motion. Neck supple. No thyromegaly present.  Cardiovascular: Normal rate, regular rhythm and normal heart sounds.  No murmur heard. Pulmonary/Chest: Effort normal and breath sounds normal. No stridor. No respiratory distress. She has no wheezes.  Abdominal: Soft. Bowel sounds are normal. She exhibits no distension. There is no tenderness. There is no guarding.  Neurological: She is alert and oriented to person, place, and time.  Skin: Skin is warm. Capillary refill takes less than 2 seconds.  Psychiatric: She has a normal mood and affect. Her behavior is normal. Judgment and thought content normal.      Assessment/Plan:   Patient Self-Management and Personalized Health Advice The patient has been provided with information about:  follow a low fat, low cholesterol diet, reduce salt in diet and cooking, use calcium 1 gram daily with Vit D, continue current medications and continue current healthy lifestyle patterns  During the course of the visit the patient was educated and counseled about appropriate screening and preventive services including:   lab testing as noted in orders  section, return annually or prn     Body mass index is 28.42 kg/m. Discussed the patient's BMI with her. The BMI BMI is not in the acceptable range; no BMI management plan is appropriate.  [name] was seen today for annual exam.  Diagnoses and  all orders for this visit:  Medicare annual wellness visit, subsequent- Women's Health Maintenance Plan Advised monthly breast exam and annual mammogram Advised dental exam every six months Discussed stress management Discussed pap smear screening guidelines- none indicated in F>65 and this pt had a hysterectomy and is currently asymptomatic   Essential hypertension-  -  continue current medications - DASH diet reviewed - discussed monitoring schedule for renal function - advised ways to prevent cardiovascular disease   -     Comprehensive metabolic panel -     Lipid panel -     lisinopril (PRINIVIL,ZESTRIL) 10 MG tablet; Take 1 tablet (10 mg total) by mouth daily.  Mixed hyperlipidemia Last lipid elevated but the HDL was good -     Lipid panel  Cervical disc disorder with radiculopathy of cervical region- stable, will refill meloxicam and will check renal function -     meloxicam (MOBIC) 15 MG tablet; Take 1 tablet (15 mg total) by mouth daily.  Other orders -     tizanidine (ZANAFLEX) 2 MG capsule; Take 1 capsule (2 mg total) by mouth 3 (three) times daily as needed for muscle spasms.      Return in about 1 year (around 04/03/2019).  No future appointments.  Patient Instructions       IF you received an x-ray today, you will receive an invoice from [facility]. Please contact [facility] at [phone] with questions or concerns regarding your invoice.   IF you received labwork today, you will receive an invoice from [facility]. Please contact [facility] at [phone] with questions or concerns regarding your invoice.   Our billing staff will not be able to assist you with questions regarding bills from  these companies.  You will be contacted with the lab results as soon as they are available. The fastest way to get your results is to activate your My Chart account. Instructions are located on the last page of this paperwork. If you have not heard from [place] regarding the results in 2 weeks, please contact this office.       An after visit summary with all of these plans was given to the patient.

## 2018-04-02 NOTE — Patient Instructions (Signed)
     IF you received an x-ray today, you will receive an invoice from [facility]. Please contact [facility] at [phone] with questions or concerns regarding your invoice.   IF you received labwork today, you will receive an invoice from [facility]. Please contact [facility] at [phone] with questions or concerns regarding your invoice.   Our billing staff will not be able to assist you with questions regarding bills from these companies.  You will be contacted with the lab results as soon as they are available. The fastest way to get your results is to activate your My Chart account. Instructions are located on the last page of this paperwork. If you have not heard from us regarding the results in 2 weeks, please contact this office.     

## 2018-04-10 ENCOUNTER — Encounter: Payer: Self-pay | Admitting: Family Medicine

## 2018-04-14 DIAGNOSIS — M5030 Other cervical disc degeneration, unspecified cervical region: Secondary | ICD-10-CM | POA: Diagnosis not present

## 2018-05-12 DIAGNOSIS — M549 Dorsalgia, unspecified: Secondary | ICD-10-CM | POA: Diagnosis not present

## 2018-05-14 ENCOUNTER — Other Ambulatory Visit: Payer: Self-pay | Admitting: Orthopaedic Surgery

## 2018-05-14 DIAGNOSIS — M4326 Fusion of spine, lumbar region: Secondary | ICD-10-CM

## 2018-05-23 ENCOUNTER — Ambulatory Visit
Admission: RE | Admit: 2018-05-23 | Discharge: 2018-05-23 | Disposition: A | Discharge status: Home / Self Care | Payer: Medicare HMO | Source: Ambulatory Visit | Attending: Orthopaedic Surgery | Admitting: Orthopaedic Surgery

## 2018-05-23 DIAGNOSIS — M5126 Other intervertebral disc displacement, lumbar region: Secondary | ICD-10-CM | POA: Diagnosis not present

## 2018-05-23 DIAGNOSIS — M4326 Fusion of spine, lumbar region: Secondary | ICD-10-CM

## 2018-05-23 DIAGNOSIS — M48061 Spinal stenosis, lumbar region without neurogenic claudication: Secondary | ICD-10-CM | POA: Diagnosis not present

## 2018-05-23 MED ORDER — GADOBENATE DIMEGLUMINE 529 MG/ML IV SOLN
15.0000 mL | Freq: Once | INTRAVENOUS | Status: AC | PRN
  Administered 2018-05-23: 15 mL via INTRAVENOUS

## 2018-05-28 DIAGNOSIS — M4326 Fusion of spine, lumbar region: Secondary | ICD-10-CM | POA: Diagnosis not present

## 2018-05-28 DIAGNOSIS — M7918 Myalgia, other site: Secondary | ICD-10-CM | POA: Diagnosis not present

## 2018-05-28 DIAGNOSIS — M47896 Other spondylosis, lumbar region: Secondary | ICD-10-CM | POA: Diagnosis not present

## 2018-06-23 DIAGNOSIS — M4326 Fusion of spine, lumbar region: Secondary | ICD-10-CM | POA: Diagnosis not present

## 2018-06-23 DIAGNOSIS — M545 Low back pain: Secondary | ICD-10-CM | POA: Diagnosis not present

## 2018-06-23 DIAGNOSIS — M47896 Other spondylosis, lumbar region: Secondary | ICD-10-CM | POA: Diagnosis not present

## 2018-06-23 DIAGNOSIS — M7918 Myalgia, other site: Secondary | ICD-10-CM | POA: Diagnosis not present

## 2018-07-14 DIAGNOSIS — M5136 Other intervertebral disc degeneration, lumbar region: Secondary | ICD-10-CM | POA: Diagnosis not present

## 2018-07-27 DIAGNOSIS — Z23 Encounter for immunization: Secondary | ICD-10-CM | POA: Diagnosis not present

## 2018-07-28 DIAGNOSIS — M4326 Fusion of spine, lumbar region: Secondary | ICD-10-CM | POA: Diagnosis not present

## 2018-07-28 DIAGNOSIS — M47896 Other spondylosis, lumbar region: Secondary | ICD-10-CM | POA: Diagnosis not present

## 2018-07-28 DIAGNOSIS — M7918 Myalgia, other site: Secondary | ICD-10-CM | POA: Diagnosis not present

## 2018-08-10 DIAGNOSIS — M47816 Spondylosis without myelopathy or radiculopathy, lumbar region: Secondary | ICD-10-CM | POA: Diagnosis not present

## 2018-08-18 DIAGNOSIS — M47816 Spondylosis without myelopathy or radiculopathy, lumbar region: Secondary | ICD-10-CM | POA: Diagnosis not present

## 2018-09-08 DIAGNOSIS — M47816 Spondylosis without myelopathy or radiculopathy, lumbar region: Secondary | ICD-10-CM | POA: Diagnosis not present

## 2018-10-06 DIAGNOSIS — M7918 Myalgia, other site: Secondary | ICD-10-CM | POA: Diagnosis not present

## 2018-10-06 DIAGNOSIS — M47816 Spondylosis without myelopathy or radiculopathy, lumbar region: Secondary | ICD-10-CM | POA: Diagnosis not present

## 2018-10-06 DIAGNOSIS — M542 Cervicalgia: Secondary | ICD-10-CM | POA: Diagnosis not present

## 2018-10-06 DIAGNOSIS — M545 Low back pain: Secondary | ICD-10-CM | POA: Diagnosis not present

## 2018-10-29 DIAGNOSIS — R69 Illness, unspecified: Secondary | ICD-10-CM | POA: Diagnosis not present

## 2018-11-25 DIAGNOSIS — M5030 Other cervical disc degeneration, unspecified cervical region: Secondary | ICD-10-CM | POA: Diagnosis not present

## 2018-12-16 DIAGNOSIS — M5030 Other cervical disc degeneration, unspecified cervical region: Secondary | ICD-10-CM | POA: Diagnosis not present

## 2018-12-16 DIAGNOSIS — M542 Cervicalgia: Secondary | ICD-10-CM | POA: Diagnosis not present

## 2018-12-16 DIAGNOSIS — M545 Low back pain: Secondary | ICD-10-CM | POA: Diagnosis not present

## 2019-01-01 DIAGNOSIS — L570 Actinic keratosis: Secondary | ICD-10-CM | POA: Diagnosis not present

## 2019-01-01 DIAGNOSIS — X32XXXD Exposure to sunlight, subsequent encounter: Secondary | ICD-10-CM | POA: Diagnosis not present

## 2019-01-01 DIAGNOSIS — D225 Melanocytic nevi of trunk: Secondary | ICD-10-CM | POA: Diagnosis not present

## 2019-01-01 DIAGNOSIS — Z1283 Encounter for screening for malignant neoplasm of skin: Secondary | ICD-10-CM | POA: Diagnosis not present

## 2019-01-01 DIAGNOSIS — L820 Inflamed seborrheic keratosis: Secondary | ICD-10-CM | POA: Diagnosis not present

## 2019-04-02 ENCOUNTER — Ambulatory Visit: Payer: Medicare HMO | Admitting: Family Medicine

## 2019-05-21 ENCOUNTER — Encounter: Payer: Medicare HMO | Admitting: Family Medicine

## 2019-05-23 ENCOUNTER — Other Ambulatory Visit: Payer: Self-pay | Admitting: Family Medicine

## 2019-05-23 DIAGNOSIS — I10 Essential (primary) hypertension: Secondary | ICD-10-CM

## 2019-05-23 NOTE — Telephone Encounter (Signed)
Courtesy refill given  Requested Prescriptions  Pending Prescriptions Disp Refills  . lisinopril (ZESTRIL) 10 MG tablet [Pharmacy Med Name: LISINOPRIL 10 MG TABLET] 30 tablet 0    Sig: TAKE 1 TABLET BY MOUTH EVERY DAY     Cardiovascular:  ACE Inhibitors Failed - 05/23/2019  9:49 AM      Failed - Cr in normal range and within 180 days    Creat  Date Value Ref Range Status  01/16/2016 1.02 (H) 0.50 - 0.99 mg/dL Final   Creatinine, Ser  Date Value Ref Range Status  04/02/2018 0.92 0.57 - 1.00 mg/dL Final         Failed - K in normal range and within 180 days    Potassium  Date Value Ref Range Status  04/02/2018 5.1 3.5 - 5.2 mmol/L Final         Failed - Valid encounter within last 6 months    Recent Outpatient Visits          1 year ago Medicare annual wellness visit, subsequent   Primary Care at [facility], Arlie Solomons, MD   2 years ago Medicare annual wellness visit, subsequent   Primary Care at [facility], [place], [place]   2 years ago Right shoulder pain   Primary [facility] at [facility], [place] [facility], [place]   3 years ago Annual physical exam   [facility] at [facility], [place], [place]   4 years ago Pain in joint, shoulder region, right   Primary Care at [facility], Fenton Malling, MD      Future Appointments            In 3 days Forrest Moron, MD Primary Care at [place], [place] - Patient is not pregnant      Passed - Last BP in normal range    BP Readings from Last 1 Encounters:  04/02/18 119/80

## 2019-05-26 ENCOUNTER — Encounter: Payer: Self-pay | Admitting: Family Medicine

## 2019-05-26 ENCOUNTER — Other Ambulatory Visit: Payer: Self-pay

## 2019-05-26 ENCOUNTER — Ambulatory Visit (INDEPENDENT_AMBULATORY_CARE_PROVIDER_SITE_OTHER): Payer: Medicare HMO | Admitting: Family Medicine

## 2019-05-26 VITALS — BP 132/81 | HR 84 | Temp 98.8°F | Resp 17 | Ht 65.0 in | Wt 173.0 lb

## 2019-05-26 DIAGNOSIS — Z0001 Encounter for general adult medical examination with abnormal findings: Secondary | ICD-10-CM | POA: Diagnosis not present

## 2019-05-26 DIAGNOSIS — I10 Essential (primary) hypertension: Secondary | ICD-10-CM

## 2019-05-26 DIAGNOSIS — Z1159 Encounter for screening for other viral diseases: Secondary | ICD-10-CM | POA: Diagnosis not present

## 2019-05-26 DIAGNOSIS — Z0000 Encounter for general adult medical examination without abnormal findings: Secondary | ICD-10-CM

## 2019-05-26 DIAGNOSIS — E782 Mixed hyperlipidemia: Secondary | ICD-10-CM

## 2019-05-26 MED ORDER — TIZANIDINE HCL 2 MG PO CAPS: 4.0000 mg | ORAL_CAPSULE | Freq: Three times a day (TID) | ORAL | 3 refills | Status: DC | PRN

## 2019-05-26 MED ORDER — LISINOPRIL 10 MG PO TABS: 10.0000 mg | ORAL_TABLET | Freq: Every day | ORAL | 1 refills | Status: DC

## 2019-05-26 NOTE — Progress Notes (Signed)
QUICK REFERENCE INFORMATION: The ABCs of Providing the Annual Wellness Visit  CMS.gov Medicare Learning Network  [employer] Annual Wellness Visit  Subjective:   [name] is a 73 y.o. Female who presents for an Annual Wellness Visit.  Hypertension: Patient here for follow-up of elevated blood pressure. She is not exercising and is adherent to low salt diet.  Blood pressure is well controlled at home. Cardiac symptoms none. Patient denies chest pain, claudication, dyspnea, exertional chest pressure/discomfort, fatigue, irregular heart beat and lower extremity edema.  Cardiovascular risk factors: dyslipidemia, hypertension and sedentary lifestyle. Use of agents associated with hypertension: none. History of target organ damage: none. BP Readings from Last 3 Encounters:  05/26/19 132/81  04/02/18 119/80  06/05/17 110/75     Patient Active Problem List   Diagnosis Date Noted  . Hyperlipidemia 03/06/2017  . History of left breast cancer 12/27/2013  . Injury of ulnar collateral ligament of wrist 02/02/2013  . Hypertension 09/25/2012  . Lumbar radiculopathy, chronic 09/25/2012  . Cervical radiculopathy 09/25/2012    Past Medical History:  Diagnosis Date  . Arthritis   . Cancer ([place])   . Cataract   . Hypertension   . Injury of ligament of hand    right thumb ulnar collateral ligament injury.  recommend MR arthrogram of the thumb.  Marland Kitchen Neuromuscular disorder ([place])    scoliosis s/p spine surgery, has arthritis and pinched nerves     Past Surgical History:  Procedure Laterality Date  . ABDOMINAL HYSTERECTOMY  2002   have ovaries  . BREAST SURGERY  2006   left, cancer  . EYE SURGERY  2012  . [place] and 2007  . TUBAL LIGATION       Outpatient Medications Prior to Visit  Medication Sig Dispense Refill  . Acetaminophen (TYLENOL ARTHRITIS PAIN PO) Take by mouth.    Marland Kitchen aspirin 81 MG tablet Take 81 mg by mouth daily.    Marland Kitchen KRILL OIL PO Take by mouth.    Marland Kitchen lisinopril  (ZESTRIL) 10 MG tablet TAKE 1 TABLET BY MOUTH EVERY DAY 30 tablet 0  . meloxicam (MOBIC) 15 MG tablet Take 1 tablet (15 mg total) by mouth daily. 90 tablet 3  . Multiple Vitamin (MULTIVITAMIN) tablet Take 1 tablet by mouth daily.    Marland Kitchen OVER THE COUNTER MEDICATION Tart Cherry 135 mg taking BID    . TURMERIC PO Take by mouth.    . tizanidine (ZANAFLEX) 2 MG capsule Take 1 capsule (2 mg total) by mouth 3 (three) times daily as needed for muscle spasms. 30 capsule 1  . b complex vitamins tablet Take 1 tablet by mouth daily.    . Cholecalciferol (VITAMIN D) 2000 units CAPS Take by mouth.     No facility-administered medications prior to visit.     Allergies  Allergen Reactions  . Codeine Other (See Comments)    DIZZINESS  . Pravachol [Pravastatin Sodium] Other (See Comments)    Per patient caused headaches and GI upset     Family History  Problem Relation Age of Onset  . Diabetes Mother        age early 33's  . Heart disease Mother        age early 32's or 86's  . Hypertension Mother   . Hyperlipidemia Mother   . Stroke Father 58       per pt mini  . Diabetes Sister 31       type II  . Hyperlipidemia Sister   .  Hypertension Sister   . Kidney disease Brother        polycystic kidney  . Diabetes Brother   . Heart disease Brother   . Alcohol abuse Maternal Grandfather   . Cancer Maternal Grandmother        Leukemia  . Cancer Paternal Aunt 12       Breast     Social History   Socioeconomic History  . Marital status: Married    Spouse name: Not on file  . Number of children: Not on file  . Years of education: Not on file  . Highest education level: Not on file  Occupational History  . Occupation: Retired  Scientific laboratory technician  . Financial resource strain: Not on file  . Food insecurity    Worry: Not on file    Inability: Not on file  . Transportation needs    Medical: Not on file    Non-medical: Not on file  Tobacco Use  . Smoking status: Never Smoker  . Smokeless  tobacco: Never Used  Substance and Sexual Activity  . Alcohol use: No  . Drug use: No  . Sexual activity: Yes  Lifestyle  . Physical activity    Days per week: Not on file    Minutes per session: Not on file  . Stress: Not on file  Relationships  . Social Herbalist on phone: Not on file    Gets together: Not on file    Attends religious service: Not on file    Active member of club or organization: Not on file    Attends meetings of clubs or organizations: Not on file    Relationship status: Not on file  Other Topics Concern  . Not on file  Social History Narrative   Married   Education: [employer]   Exercise: two times a week      Recent Hospitalizations? No  Health Habits: Current exercise activities include: none Exercise: 0 times/week. Diet: in general, a "healthy" diet    Alcohol intake: none  Health Risk Assessment: The patient has completed a Health Risk Assessment. This has been reveiwed with them and has been scanned into the [facility] system as an attached document.  Current Medical Providers and Suppliers: [facility] Patient Care Team: Forrest Moron, MD as PCP - General (Internal Medicine) Wonda Horner, MD as Consulting Physician (Gastroenterology) Zonia Kief, MD as Attending Physician (Rehabilitation) No future appointments.   Age-appropriate Screening Schedule: The list below includes current immunization status and future screening recommendations based on patient's age. Orders for these recommended tests are listed in the plan section. The patient has been provided with a written plan. Immunization History  Administered Date(s) Administered  . Hepatitis B 09/18/2011, 02/10/2012  . Influenza, High Dose Seasonal PF 07/27/2014, 07/13/2015, 07/30/2016  . Influenza-Unspecified 06/21/2013, 07/22/2015  . Pneumococcal Conjugate-13 01/09/2015  . Pneumococcal-Unspecified 08/02/2011  . Tdap 07/22/2011  . Zoster 08/21/2010    Health  Maintenance reviewed -  patient asked to schedule her mammogram   Depression Screen-PHQ2/9 completed today  Depression screen [facility] 2/9 05/26/2019 04/02/2018 05/30/2016 01/16/2016 01/09/2015  Decreased Interest 0 0 0 0 0  Down, Depressed, Hopeless 0 0 0 0 0  PHQ - 2 Score 0 0 0 0 0     Depression Severity and Treatment Recommendations:  0-4= None  5-9= Mild / Treatment: Support, educate to call if worse; return in one month  10-14= Moderate / Treatment: Support, watchful waiting; Antidepressant or Psycotherapy  15-19= Moderately severe / Treatment: Antidepressant OR Psychotherapy  >= 20 = Major depression, severe / Antidepressant AND Psychotherapy  Functional Status Survey:   Is the patient deaf or have difficulty hearing?: No Does the patient have difficulty seeing, even when wearing glasses/contacts?: No Does the patient have difficulty concentrating, remembering, or making decisions?: No Does the patient have difficulty walking or climbing stairs?: No Does the patient have difficulty dressing or bathing?: No Does the patient have difficulty doing errands alone such as visiting a doctor's office or shopping?: No   Advanced Care Planning: 1. Patient has executed an Advance Directive: No 2. If no, patient was given the opportunity to execute an Advance Directive today? She is working on it with her family 3. Are the patient's advanced directives in [place]? No 4. This patient has the ability to prepare an Advance Directive: Yes 5. Provider is willing to follow the patient's wishes: Yes  Cognitive Assessment: Does the patient have evidence of cognitive impairment? No The patient does not have any evidence of any cognitive problems and denies any  change in mood/affect, appearance, speech, memory or motor skills.  Identification of Risk Factors: Risk factors include: hyperlipidemia and hypertension  ROS  Review of Systems  Constitutional: Negative for activity change, appetite change,  chills and fever.  HENT: Negative for congestion, nosebleeds, trouble swallowing and voice change.   Respiratory: Negative for cough, shortness of breath and wheezing.   Gastrointestinal: Negative for diarrhea, nausea and vomiting.  Genitourinary: Negative for difficulty urinating, dysuria, flank pain and hematuria.  Musculoskeletal: Negative for back pain, joint swelling and neck pain.  Neurological: Negative for dizziness, speech difficulty, light-headedness and numbness.  See HPI. All other review of systems negative.    Objective:   Vitals:   05/26/19 0852  BP: 132/81  Pulse: 84  Resp: 17  Temp: 98.8 F (37.1 C)  TempSrc: Oral  SpO2: 95%  Weight: 173 lb (78.5 kg)  Height: 5\' 5"  (1.651 m)    Body mass index is 28.79 kg/m.  Physical Exam Constitutional:      Appearance: Normal appearance. She is normal weight.  HENT:     Head: Normocephalic and atraumatic.     Nose: Nose normal.  Eyes:     Extraocular Movements: Extraocular movements intact.     Conjunctiva/sclera: Conjunctivae normal.  Cardiovascular:     Rate and Rhythm: Normal rate and regular rhythm.     Pulses: Normal pulses.     Heart sounds: No murmur.  Pulmonary:     Effort: Pulmonary effort is normal. No respiratory distress.     Breath sounds: Normal breath sounds. No wheezing.  Chest:     Breasts:        Right: Normal. No swelling, bleeding, inverted nipple, mass, nipple discharge, skin change or tenderness.        Left: Normal. No swelling, bleeding, inverted nipple, mass, nipple discharge, skin change or tenderness.  Abdominal:     General: Abdomen is flat. Bowel sounds are normal. There is no distension.     Palpations: Abdomen is soft. There is no mass.     Tenderness: There is no abdominal tenderness. There is no guarding or rebound.     Hernia: No hernia is present.  Skin:    General: Skin is warm.     Capillary Refill: Capillary refill takes less than 2 seconds.  Neurological:     Mental  Status: She is alert.  Psychiatric:  Mood and Affect: Mood normal.        Behavior: Behavior normal.        Thought Content: Thought content normal.        Judgment: Judgment normal.        Assessment/Plan:   Patient Self-Management and Personalized Health Advice The patient has been provided with information about:  begin progressive daily aerobic exercise program, reduce salt in diet and cooking, improve dietary compliance, continue current medications and continue current healthy lifestyle patterns  During the course of the visit the patient was educated and counseled about appropriate screening and preventive services including:   return annually or prn    Body mass index is 28.79 kg/m. Discussed the patient's BMI with her. The BMI BMI is in the acceptable range  Chaz was seen today for annual exam.  Diagnoses and all orders for this visit:  Medicare annual wellness visit, subsequent  Essential hypertension- Patient's blood pressure is at goal of 139/89 or less. Condition is stable. Continue current medications and treatment plan. I recommend that you exercise for 30-45 minutes 5 days a week. I also recommend a balanced diet with fruits and vegetables every day, lean meats, and little fried foods. The DASH diet (you can find this online) is a good example of this. -     Lipid panel -     Comprehensive metabolic panel  Mixed hyperlipidemia- Discussed medications that affect lipids Reminded patient to avoid grapefruits Reviewed last 3 lipids Discussed current meds: statin, aspirin Advised dietary fiber and fish oil and ways to keep HDL high CAD prevention and reviewed side effects of statins Patient is a nonsmoker.   -     Lipid panel -     Comprehensive metabolic panel  Need for hepatitis C screening test -     HCV Ab w Reflex to Quant PCR  Other orders -     tizanidine (ZANAFLEX) 2 MG capsule; Take 2 capsules (4 mg total) by mouth 3 (three) times daily as  needed for muscle spasms.      No follow-ups on file.  No future appointments.  Patient Instructions       If you have lab work done today you will be contacted with your lab results within the next 2 weeks.  If you have not heard from [place] then please contact us. The fastest way to get your results is to register for My Chart.   IF you received an x-ray today, you will receive an invoice from [facility]. Please contact [facility] at [phone] with questions or concerns regarding your invoice.   IF you received labwork today, you will receive an invoice from [facility]. Please contact [facility] at [phone] with questions or concerns regarding your invoice.   Our billing staff will not be able to assist you with questions regarding bills from these companies.  You will be contacted with the lab results as soon as they are available. The fastest way to get your results is to activate your My Chart account. Instructions are located on the last page of this paperwork. If you have not heard from [place] regarding the results in 2 weeks, please contact this office.       An after visit summary with all of these plans was given to the patient.

## 2019-05-26 NOTE — Patient Instructions (Addendum)
   If you have lab work done today you will be contacted with your lab results within the next 2 weeks.  If you have not heard from us then please contact us. The fastest way to get your results is to register for My Chart.   IF you received an x-ray today, you will receive an invoice from [facility]. Please contact  Radiology at [phone] with questions or concerns regarding your invoice.   IF you received labwork today, you will receive an invoice from [facility]. Please contact [facility] at [phone] with questions or concerns regarding your invoice.   Our billing staff will not be able to assist you with questions regarding bills from these companies.  You will be contacted with the lab results as soon as they are available. The fastest way to get your results is to activate your My Chart account. Instructions are located on the last page of this paperwork. If you have not heard from us regarding the results in 2 weeks, please contact this office.     Health Maintenance After Age 65 After age 65, you are at a higher risk for certain long-term diseases and infections as well as injuries from falls. Falls are a major cause of broken bones and head injuries in people who are older than age 65. Getting regular preventive care can help to keep you healthy and well. Preventive care includes getting regular testing and making lifestyle changes as recommended by your health care provider. Talk with your health care provider about:  Which screenings and tests you should have. A screening is a test that checks for a disease when you have no symptoms.  A diet and exercise plan that is right for you. What should I know about screenings and tests to prevent falls? Screening and testing are the best ways to find a health problem early. Early diagnosis and treatment give you the best chance of managing medical conditions that are common after age 65. Certain conditions and  lifestyle choices may make you more likely to have a fall. Your health care provider may recommend:  Regular vision checks. Poor vision and conditions such as cataracts can make you more likely to have a fall. If you wear glasses, make sure to get your prescription updated if your vision changes.  Medicine review. Work with your health care provider to regularly review all of the medicines you are taking, including over-the-counter medicines. Ask your health care provider about any side effects that may make you more likely to have a fall. Tell your health care provider if any medicines that you take make you feel dizzy or sleepy.  Osteoporosis screening. Osteoporosis is a condition that causes the bones to get weaker. This can make the bones weak and cause them to break more easily.  Blood pressure screening. Blood pressure changes and medicines to control blood pressure can make you feel dizzy.  Strength and balance checks. Your health care provider may recommend certain tests to check your strength and balance while standing, walking, or changing positions.  Foot health exam. Foot pain and numbness, as well as not wearing proper footwear, can make you more likely to have a fall.  Depression screening. You may be more likely to have a fall if you have a fear of falling, feel emotionally low, or feel unable to do activities that you used to do.  Alcohol use screening. Using too much alcohol can affect your balance and may make you more likely to   have a fall. What actions can I take to lower my risk of falls? General instructions  Talk with your health care provider about your risks for falling. Tell your health care provider if: ? You fall. Be sure to tell your health care provider about all falls, even ones that seem minor. ? You feel dizzy, sleepy, or off-balance.  Take over-the-counter and prescription medicines only as told by your health care provider. These include any  supplements.  Eat a healthy diet and maintain a healthy weight. A healthy diet includes low-fat dairy products, low-fat (lean) meats, and fiber from whole grains, beans, and lots of fruits and vegetables. Home safety  Remove any tripping hazards, such as rugs, cords, and clutter.  Install safety equipment such as grab bars in bathrooms and safety rails on stairs.  Keep rooms and walkways well-lit. Activity   Follow a regular exercise program to stay fit. This will help you maintain your balance. Ask your health care provider what types of exercise are appropriate for you.  If you need a cane or walker, use it as recommended by your health care provider.  Wear supportive shoes that have nonskid soles. Lifestyle  Do not drink alcohol if your health care provider tells you not to drink.  If you drink alcohol, limit how much you have: ? 0-1 drink a day for women. ? 0-2 drinks a day for men.  Be aware of how much alcohol is in your drink. In the [place] one drink equals one typical bottle of beer (12 oz), one-half glass of wine (5 oz), or one shot of hard liquor (1 oz).  Do not use any products that contain nicotine or tobacco, such as cigarettes and e-cigarettes. If you need help quitting, ask your health care provider. Summary  Having a healthy lifestyle and getting preventive care can help to protect your health and wellness after age 65.  Screening and testing are the best way to find a health problem early and help you avoid having a fall. Early diagnosis and treatment give you the best chance for managing medical conditions that are more common for people who are older than age 65.  Falls are a major cause of broken bones and head injuries in people who are older than age 65. Take precautions to prevent a fall at home.  Work with your health care provider to learn what changes you can make to improve your health and wellness and to prevent falls. This information is not intended  to replace advice given to you by your health care provider. Make sure you discuss any questions you have with your health care provider. Document Released: 08/20/2017 Document Revised: 01/28/2019 Document Reviewed: 08/20/2017 Elsevier Patient Education  2020 Elsevier Inc.  

## 2019-05-27 ENCOUNTER — Other Ambulatory Visit: Payer: Self-pay | Admitting: Family Medicine

## 2019-05-27 DIAGNOSIS — I10 Essential (primary) hypertension: Secondary | ICD-10-CM

## 2019-05-27 DIAGNOSIS — M5010 Cervical disc disorder with radiculopathy, unspecified cervical region: Secondary | ICD-10-CM

## 2019-05-27 NOTE — Telephone Encounter (Signed)
Copied from [place] [id number]. Topic: Quick Communication - Rx Refill/Question >> May 27, 2019  1:12 PM Nils Flack, Marland Kitchen wrote: Medication: lisinopril (ZESTRIL) 10 MG tablet meloxicam (MOBIC) 15 MG tablet tizanidine 4 mg   Has the patient contacted their pharmacy? Yes.   (Agent: If no, request that the patient contact the pharmacy for the refill.) (Agent: If yes, when and what did the pharmacy advise?)  Preferred Pharmacy (with phone number or street name): [facility]  Please note pt says the tizanidine should be increased to 4 mg   Agent: Please be advised that RX refills may take up to 3 business days. We ask that you follow-up with your pharmacy.

## 2019-05-27 NOTE — Telephone Encounter (Signed)
Please advise 

## 2019-05-27 NOTE — Telephone Encounter (Signed)
Lisinopril and Tizanidine already ordered on 05/26/19 by PCP and [facility] to [facility]

## 2019-05-28 LAB — COMPREHENSIVE METABOLIC PANEL
ALT: 15 IU/L (ref 0–32)
AST: 17 IU/L (ref 0–40)
Albumin/Globulin Ratio: 1.9 (ref 1.2–2.2)
Albumin: 4.6 g/dL (ref 3.7–4.7)
Alkaline Phosphatase: 49 IU/L (ref 39–117)
BUN/Creatinine Ratio: 17 (ref 12–28)
BUN: 16 mg/dL (ref 8–27)
Bilirubin Total: 0.5 mg/dL (ref 0.0–1.2)
CO2: 19 mmol/L — ABNORMAL LOW (ref 20–29)
Calcium: 10 mg/dL (ref 8.7–10.3)
Chloride: 106 mmol/L (ref 96–106)
Creatinine, Ser: 0.94 mg/dL (ref 0.57–1.00)
GFR calc Af Amer: 70 mL/min/{1.73_m2} (ref >59)
GFR calc non Af Amer: 60 mL/min/{1.73_m2} (ref >59)
Globulin, Total: 2.4 g/dL (ref 1.5–4.5)
Glucose: 105 mg/dL — ABNORMAL HIGH (ref 65–99)
Potassium: 4.6 mmol/L (ref 3.5–5.2)
Sodium: 143 mmol/L (ref 134–144)
Total Protein: 7 g/dL (ref 6.0–8.5)

## 2019-05-28 LAB — LIPID PANEL
Chol/HDL Ratio: 6.3 ratio — ABNORMAL HIGH (ref 0.0–4.4)
Cholesterol, Total: 258 mg/dL — ABNORMAL HIGH (ref 100–199)
HDL: 41 mg/dL (ref >39)
LDL Calculated: 186 mg/dL — ABNORMAL HIGH (ref 0–99)
Triglycerides: 155 mg/dL — ABNORMAL HIGH (ref 0–149)
VLDL Cholesterol Cal: 31 mg/dL (ref 5–40)

## 2019-05-28 LAB — HCV AB W REFLEX TO QUANT PCR: HCV Ab: <0.1 s/co ratio (ref 0.0–0.9)

## 2019-05-28 LAB — HCV INTERPRETATION

## 2019-05-31 ENCOUNTER — Encounter: Payer: Self-pay | Admitting: Family Medicine

## 2019-06-01 ENCOUNTER — Telehealth: Payer: Self-pay | Admitting: Family Medicine

## 2019-06-01 DIAGNOSIS — M5010 Cervical disc disorder with radiculopathy, unspecified cervical region: Secondary | ICD-10-CM

## 2019-06-01 MED ORDER — MELOXICAM 15 MG PO TABS: 15.0000 mg | ORAL_TABLET | Freq: Every day | ORAL | 0 refills | Status: DC

## 2019-06-01 NOTE — Telephone Encounter (Signed)
Medication Refill - Medication: tizanidine (ZANAFLEX) 2 MG capsule [[id number]]  90 day supply and 5mg  tabs   Has the patient contacted their pharmacy? No. (Agent: If no, request that the patient contact the pharmacy for the refill.) (Agent: If yes, when and what did the pharmacy advise?)  Preferred Pharmacy (with phone number or street name): *[id number] [place], [place] - [address] Agent: Please be advised that RX refills may take up to 3 business days. We ask that you follow-up with your pharmacy.

## 2019-06-01 NOTE — Telephone Encounter (Signed)
Last seen for a medicare wellness on 05/26/19   Is muscle relaxer refill appropriate?

## 2019-06-02 NOTE — Telephone Encounter (Signed)
Pt is requesting meds

## 2019-06-02 NOTE — Telephone Encounter (Signed)
Pt called stating she is very upset this has not been taken care of. Please advise.

## 2019-06-02 NOTE — Addendum Note (Signed)
Addended by: Dimple Nanas on: 06/02/2019 05:20 PM   Modules accepted: Orders

## 2019-06-04 ENCOUNTER — Other Ambulatory Visit: Payer: Self-pay

## 2019-06-04 ENCOUNTER — Telehealth: Payer: Self-pay

## 2019-06-04 DIAGNOSIS — I10 Essential (primary) hypertension: Secondary | ICD-10-CM

## 2019-06-04 MED ORDER — LISINOPRIL 10 MG PO TABS: 10.0000 mg | ORAL_TABLET | Freq: Every day | ORAL | 3 refills | Status: DC

## 2019-06-04 MED ORDER — MELOXICAM 15 MG PO TABS: 15.0000 mg | ORAL_TABLET | Freq: Every day | ORAL | 3 refills | Status: DC

## 2019-06-04 MED ORDER — TIZANIDINE HCL 4 MG PO CAPS: 4.0000 mg | ORAL_CAPSULE | Freq: Three times a day (TID) | ORAL | 6 refills | Status: DC | PRN

## 2019-06-04 NOTE — Addendum Note (Signed)
Addended by: Delia Chimes A on: 06/04/2019 01:53 PM   Modules accepted: Orders

## 2019-06-04 NOTE — Telephone Encounter (Signed)
Spoke with pt via phone advised tizanidine mg #90 w/6 refills, lisinopril  Mg #90 with 3 refills(fixed this one original rx was for 90 with 1 refill and pt needed refills to last until 05/2020 in order for medicare to pay) and meloxican 15  Mg #90 with 3 refills.  Pt agreeable and appreciative of call back and issue resolved. Dgaddy, CMA

## 2019-06-04 NOTE — Telephone Encounter (Signed)
Pt states this is 4th call and she has still not gotten her prescriptions. Pt states she takes the  tizanidine (ZANAFLEX) 2 MG capsule Route: Take 2 capsules (4 mg total) by mouth 3 (three) times daily as needed for muscle spasms.   Only 30 tabs sent in. Pt states she gets a 90 day.  This is not but a 18 day supply.  Also though dr Nolon Rod was going to increase this to 5 mg tab since this does not seem to be working.   Please advise

## 2019-06-06 NOTE — Telephone Encounter (Signed)
Zanaflex refilled on 06/04/2019.

## 2019-06-07 ENCOUNTER — Other Ambulatory Visit: Payer: Self-pay

## 2019-06-07 ENCOUNTER — Telehealth: Payer: Medicare HMO | Admitting: Physician Assistant

## 2019-06-07 ENCOUNTER — Encounter: Payer: Self-pay | Admitting: Physician Assistant

## 2019-06-07 DIAGNOSIS — R059 Cough, unspecified: Secondary | ICD-10-CM

## 2019-06-07 DIAGNOSIS — R0602 Shortness of breath: Secondary | ICD-10-CM

## 2019-06-07 DIAGNOSIS — Z20822 Contact with and (suspected) exposure to covid-19: Secondary | ICD-10-CM

## 2019-06-07 DIAGNOSIS — R05 Cough: Secondary | ICD-10-CM

## 2019-06-07 MED ORDER — BENZONATATE 100 MG PO CAPS: 100.0000 mg | ORAL_CAPSULE | Freq: Three times a day (TID) | ORAL | 0 refills | Status: DC | PRN

## 2019-06-07 MED ORDER — ALBUTEROL SULFATE HFA 108 (90 BASE) MCG/ACT IN AERS: 2.0000 | INHALATION_SPRAY | Freq: Four times a day (QID) | RESPIRATORY_TRACT | 0 refills | Status: DC | PRN

## 2019-06-07 NOTE — Progress Notes (Signed)
E-Visit for Corona Virus Screening   Your current symptoms could be consistent with the coronavirus.  Many health care providers can now test patients at their office but not all are.  [facility] has multiple testing sites. For information on our COVID testing locations and hours go to [email or web address]  Please quarantine yourself while awaiting your test results.  We are enrolling you in our [place] for [place] . Daily you will receive a questionnaire within the [facility] website. Our COVID 19 response team willl be monitoriing your responses daily.  COVID 19 Testing Locations (Monday - Friday, 8 a.m. - 3:30 p.m.) [place]: [facility] at [facility], [address] [place], [place]: [place], [place], [place], [place] (entrance off [address]) [place]: [place]. [address] [place] [place], [place] (across from [facility])    COVID-19 is a respiratory illness with symptoms that are similar to the flu. Symptoms are typically mild to moderate, but there have been cases of severe illness and death due to the virus. The following symptoms may appear 2-14 days after exposure: . Fever . Cough . Shortness of breath or difficulty breathing . Chills . Repeated shaking with chills . Muscle pain . Headache . Sore throat . New loss of taste or smell . Fatigue . Congestion or runny nose . Nausea or vomiting . Diarrhea  It is vitally important that if you feel that you have an infection such as this virus or any other virus that you stay home and away from places where you may spread it to others.  You should self-quarantine for 14 days if you have symptoms that could potentially be coronavirus or have been in close contact a with a person diagnosed with COVID-19 within the last 2 weeks. You should avoid contact with people age 16 and older.   You should wear a mask or  cloth face covering over your nose and mouth if you must be around other people or animals, including pets (even at home). Try to stay at least 6 feet away from other people. This will protect the people around you.  You can use medication such as A prescription cough medication called Tessalon Perles 100 mg. You may take 1-2 capsules every 8 hours as needed for cough and A prescription inhaler called Albuterol MDI 90 mcg /actuation 2 puffs every 4 hours as needed for shortness of breath, wheezing, cough  You may also take acetaminophen (Tylenol) as needed for fever.  I have provided a work note    Reduce your risk of any infection by using the same precautions used for avoiding the common cold or flu:  Marland Kitchen Wash your hands often with soap and warm water for at least 20 seconds.  If soap and water are not readily available, use an alcohol-based hand sanitizer with at least 60% alcohol.  . If coughing or sneezing, cover your mouth and nose by coughing or sneezing into the elbow areas of your shirt or coat, into a tissue or into your sleeve (not your hands). . Avoid shaking hands with others and consider head nods or verbal greetings only. . Avoid touching your eyes, nose, or mouth with unwashed hands.  . Avoid close contact with people who are sick. . Avoid places or events with large numbers of people in one location, like concerts or sporting events. . Carefully consider travel plans you have or are making. . If you are planning any  travel outside or inside the [place], visit the CDC's Travelers' Health webpage for the latest health notices. . If you have some symptoms but not all symptoms, continue to monitor at home and seek medical attention if your symptoms worsen. . If you are having a medical emergency, call 911.  HOME CARE . Only take medications as instructed by your medical team. . Drink plenty of fluids and get plenty of rest. . A steam or ultrasonic humidifier can help if you have  congestion.   GET HELP RIGHT AWAY IF YOU HAVE EMERGENCY WARNING SIGNS** FOR COVID-19. If you or someone is showing any of these signs seek emergency medical care immediately. Call 911 or proceed to your closest emergency facility if: . You develop worsening high fever. . Trouble breathing . Bluish lips or face . Persistent pain or pressure in the chest . New confusion . Inability to wake or stay awake . You cough up blood. . Your symptoms become more severe  **This list is not all possible symptoms. Contact your medical provider for any symptoms that are sever or concerning to you.   MAKE SURE YOU   Understand these instructions.  Will watch your condition.  Will get help right away if you are not doing well or get worse.  Your e-visit answers were reviewed by a board certified advanced clinical practitioner to complete your personal care plan.  Depending on the condition, your plan could have included both over the counter or prescription medications.  If there is a problem please reply once you have received a response from your provider.  Your safety is important to [place].  If you have drug allergies check your prescription carefully.    You can use MyChart to ask questions about today's visit, request a non-urgent call back, or ask for a work or school excuse for 24 hours related to this e-Visit. If it has been greater than 24 hours you will need to follow up with your provider, or enter a new e-Visit to address those concerns. You will get an e-mail in the next two days asking about your experience.  I hope that your e-visit has been valuable and will speed your recovery. Thank you for using e-visits.   I spent 5-10 minutes on review and completion of this note- L[facility]

## 2019-06-08 ENCOUNTER — Encounter (INDEPENDENT_AMBULATORY_CARE_PROVIDER_SITE_OTHER): Payer: Self-pay

## 2019-06-09 ENCOUNTER — Encounter (INDEPENDENT_AMBULATORY_CARE_PROVIDER_SITE_OTHER): Payer: Self-pay

## 2019-06-09 ENCOUNTER — Telehealth: Payer: Self-pay | Admitting: *Deleted

## 2019-06-09 LAB — NOVEL CORONAVIRUS, NAA: SARS-CoV-2, NAA: NOT DETECTED

## 2019-06-09 LAB — SPECIMEN STATUS REPORT

## 2019-06-09 NOTE — Telephone Encounter (Signed)
Contacted pt due to [place] response of shortness of breath, and worsen cough; the pt states that she is not able to use albuterol because it makes her heart race, and the tessalon perles no longer work; pt advised to go the ED or Urgent Care, or contact her PCP for additional recommendations; she verbalized understanding.

## 2019-06-10 ENCOUNTER — Encounter: Payer: Self-pay | Admitting: Emergency Medicine

## 2019-06-10 ENCOUNTER — Telehealth: Payer: Self-pay | Admitting: *Deleted

## 2019-06-10 ENCOUNTER — Telehealth (INDEPENDENT_AMBULATORY_CARE_PROVIDER_SITE_OTHER): Payer: Medicare HMO | Admitting: Emergency Medicine

## 2019-06-10 ENCOUNTER — Other Ambulatory Visit: Payer: Self-pay

## 2019-06-10 ENCOUNTER — Encounter (INDEPENDENT_AMBULATORY_CARE_PROVIDER_SITE_OTHER): Payer: Self-pay

## 2019-06-10 VITALS — Wt 170.0 lb

## 2019-06-10 DIAGNOSIS — R05 Cough: Secondary | ICD-10-CM

## 2019-06-10 DIAGNOSIS — R059 Cough, unspecified: Secondary | ICD-10-CM

## 2019-06-10 DIAGNOSIS — J22 Unspecified acute lower respiratory infection: Secondary | ICD-10-CM

## 2019-06-10 DIAGNOSIS — R062 Wheezing: Secondary | ICD-10-CM | POA: Diagnosis not present

## 2019-06-10 MED ORDER — HYDROCODONE-HOMATROPINE 5-1.5 MG/5ML PO SYRP: 5.0000 mL | ORAL_SOLUTION | Freq: Every evening | ORAL | 0 refills | Status: DC | PRN

## 2019-06-10 MED ORDER — PREDNISONE 20 MG PO TABS: 40.0000 mg | ORAL_TABLET | Freq: Every day | ORAL | 0 refills | Status: AC

## 2019-06-10 MED ORDER — AZITHROMYCIN 250 MG PO TABS: ORAL_TABLET | ORAL | 0 refills | Status: DC

## 2019-06-10 NOTE — Progress Notes (Signed)
Called patient to triage for appointment. Patient states she last Friday she started with a headache and feeling fatigue. Patient states she has a cough with yellow mucus, fever 99.8 and has started wheezing.

## 2019-06-10 NOTE — Progress Notes (Signed)
Telemedicine Encounter- SOAP NOTE Established Patient  This telephone encounter was conducted with the patient's (or proxy's) verbal consent via audio telecommunications: yes/no: Yes Patient was instructed to have this encounter in a suitably private space; and to only have persons present to whom they give permission to participate. In addition, patient identity was confirmed by use of name plus two identifiers (DOB and address).  I discussed the limitations, risks, security and privacy concerns of performing an evaluation and management service by telephone and the availability of in person appointments. I also discussed with the patient that there may be a patient responsible charge related to this service. The patient expressed understanding and agreed to proceed.  I spent a total of TIME; 0 MIN TO 60 MIN: 15 minutes talking with the patient or their proxy.  No chief complaint on file. Flulike symptoms  Subjective   [name] is a 73 y.o. female established patient. Telephone visit today for flulike symptoms that started 6 days ago with headaches, fatigue, cough.  ED visit last Monday.  Negative COVID test.  Started on albuterol and Tessalon.  Had a reaction to albuterol so she cannot take it.  Today complaining of worsening cough productive of yellow sputum with wheezing and dyspnea on exertion.  No history of asthma or COPD.  Feels worse than several days ago.  Denies pleuritic chest pain.  No other significant symptoms.  HPI   Patient Active Problem List   Diagnosis Date Noted  . Hyperlipidemia 03/06/2017  . History of left breast cancer 12/27/2013  . Injury of ulnar collateral ligament of wrist 02/02/2013  . Hypertension 09/25/2012  . Lumbar radiculopathy, chronic 09/25/2012  . Cervical radiculopathy 09/25/2012    Past Medical History:  Diagnosis Date  . Arthritis   . Cancer ([place])   . Cataract   . Hypertension   . Injury of ligament of hand    right thumb ulnar  collateral ligament injury.  recommend MR arthrogram of the thumb.  Marland Kitchen Neuromuscular disorder ([place])    scoliosis s/p spine surgery, has arthritis and pinched nerves    Current Outpatient Medications  Medication Sig Dispense Refill  . Acetaminophen (TYLENOL ARTHRITIS PAIN PO) Take by mouth.    Marland Kitchen aspirin 81 MG tablet Take 81 mg by mouth daily.    . benzonatate (TESSALON) 100 MG capsule Take 1 capsule (100 mg total) by mouth 3 (three) times daily as needed for cough. 20 capsule 0  . KRILL OIL PO Take by mouth.    Marland Kitchen lisinopril (ZESTRIL) 10 MG tablet Take 1 tablet (10 mg total) by mouth daily. 90 tablet 3  . meloxicam (MOBIC) 15 MG tablet Take 1 tablet (15 mg total) by mouth daily. 90 tablet 3  . Multiple Vitamin (MULTIVITAMIN) tablet Take 1 tablet by mouth daily.    Marland Kitchen OVER THE COUNTER MEDICATION Tart Cherry 135 mg taking BID    . tizanidine (ZANAFLEX) 4 MG capsule Take 1 capsule (4 mg total) by mouth 3 (three) times daily as needed for muscle spasms. 90 capsule 6  . TURMERIC PO Take by mouth.    Marland Kitchen albuterol (VENTOLIN HFA) 108 (90 Base) MCG/ACT inhaler Inhale 2 puffs into the lungs every 6 (six) hours as needed for wheezing or shortness of breath. (Patient not taking: Reported on 06/10/2019) 6.7 g 0  . b complex vitamins tablet Take 1 tablet by mouth daily.    . Cholecalciferol (VITAMIN D) 2000 units CAPS Take by mouth.  No current facility-administered medications for this visit.     Allergies  Allergen Reactions  . Albuterol Other (See Comments)    Heart race, tremble and studdering  . Codeine Other (See Comments)    DIZZINESS  . Pravachol [Pravastatin Sodium] Other (See Comments)    Per patient caused headaches and GI upset    Social History   Socioeconomic History  . Marital status: Married    Spouse name: Not on file  . Number of children: Not on file  . Years of education: Not on file  . Highest education level: Not on file  Occupational History  . Occupation: Retired   Scientific laboratory technician  . Financial resource strain: Not on file  . Food insecurity    Worry: Not on file    Inability: Not on file  . Transportation needs    Medical: Not on file    Non-medical: Not on file  Tobacco Use  . Smoking status: Never Smoker  . Smokeless tobacco: Never Used  Substance and Sexual Activity  . Alcohol use: No  . Drug use: No  . Sexual activity: Yes  Lifestyle  . Physical activity    Days per week: Not on file    Minutes per session: Not on file  . Stress: Not on file  Relationships  . Social Herbalist on phone: Not on file    Gets together: Not on file    Attends religious service: Not on file    Active member of club or organization: Not on file    Attends meetings of clubs or organizations: Not on file    Relationship status: Not on file  . Intimate partner violence    Fear of current or ex partner: Not on file    Emotionally abused: Not on file    Physically abused: Not on file    Forced sexual activity: Not on file  Other Topics Concern  . Not on file  Social History Narrative   Married   Education: [employer]   Exercise: two times a week    Review of Systems  Constitutional: Positive for malaise/fatigue. Negative for chills and fever.  HENT: Positive for congestion. Negative for sore throat.   Respiratory: Positive for cough, sputum production, shortness of breath and wheezing. Negative for hemoptysis.   Cardiovascular: Negative for chest pain.  Gastrointestinal: Negative for abdominal pain, diarrhea, nausea and vomiting.  Genitourinary: Negative.   Musculoskeletal: Negative for myalgias and neck pain.  Skin: Negative.  Negative for rash.  Neurological: Positive for headaches. Negative for dizziness.  All other systems reviewed and are negative.   Objective   Vitals as reported by the patient: Today's Vitals   06/10/19 1610  Weight: 170 lb (77.1 kg)  Alert and oriented x3 in no apparent respiratory distress.  There are no  diagnoses linked to this encounter.  Diagnoses and all orders for this visit:  Lower respiratory infection -     azithromycin (ZITHROMAX) 250 MG tablet; Sig as indicated  Cough -     HYDROcodone-homatropine (HYCODAN) 5-1.5 MG/5ML syrup; Take 5 mLs by mouth at bedtime as needed for cough.  Wheezing -     predniSONE (DELTASONE) 20 MG tablet; Take 2 tablets (40 mg total) by mouth daily with breakfast for 5 days.     I discussed the assessment and treatment plan with the patient. The patient was provided an opportunity to ask questions and all were answered. The patient agreed with the plan  and demonstrated an understanding of the instructions.   The patient was advised to call back or seek an in-person evaluation if the symptoms worsen or if the condition fails to improve as anticipated.  I provided 15 minutes of non-face-to-face time during this encounter.  Horald Pollen, MD  Primary Care at [facility]

## 2019-06-10 NOTE — Telephone Encounter (Signed)
Called patient to triage for appointment. The mobile number was called first and then home number. Called patient at home number to be triaged and spoke to patient.

## 2019-06-11 ENCOUNTER — Encounter (INDEPENDENT_AMBULATORY_CARE_PROVIDER_SITE_OTHER): Payer: Self-pay

## 2019-06-12 ENCOUNTER — Encounter (INDEPENDENT_AMBULATORY_CARE_PROVIDER_SITE_OTHER): Payer: Self-pay

## 2019-06-13 ENCOUNTER — Encounter (HOSPITAL_COMMUNITY): Payer: Self-pay | Admitting: Emergency Medicine

## 2019-06-13 ENCOUNTER — Other Ambulatory Visit: Payer: Self-pay

## 2019-06-13 ENCOUNTER — Telehealth: Payer: Self-pay | Admitting: *Deleted

## 2019-06-13 ENCOUNTER — Ambulatory Visit (HOSPITAL_COMMUNITY)
Admission: EM | Admit: 2019-06-13 | Discharge: 2019-06-13 | Disposition: A | Discharge status: Home / Self Care | Payer: Medicare HMO

## 2019-06-13 ENCOUNTER — Ambulatory Visit (INDEPENDENT_AMBULATORY_CARE_PROVIDER_SITE_OTHER): Payer: Medicare HMO

## 2019-06-13 ENCOUNTER — Encounter (INDEPENDENT_AMBULATORY_CARE_PROVIDER_SITE_OTHER): Payer: Self-pay

## 2019-06-13 DIAGNOSIS — R0602 Shortness of breath: Secondary | ICD-10-CM

## 2019-06-13 DIAGNOSIS — R05 Cough: Secondary | ICD-10-CM | POA: Diagnosis not present

## 2019-06-13 DIAGNOSIS — J40 Bronchitis, not specified as acute or chronic: Secondary | ICD-10-CM | POA: Diagnosis not present

## 2019-06-13 DIAGNOSIS — R062 Wheezing: Secondary | ICD-10-CM | POA: Diagnosis not present

## 2019-06-13 MED ORDER — BENZONATATE 200 MG PO CAPS: 200.0000 mg | ORAL_CAPSULE | Freq: Three times a day (TID) | ORAL | 0 refills | Status: AC | PRN

## 2019-06-13 MED ORDER — LEVALBUTEROL TARTRATE 45 MCG/ACT IN AERO: 1.0000 | INHALATION_SPRAY | RESPIRATORY_TRACT | 12 refills | Status: DC | PRN

## 2019-06-13 NOTE — Discharge Instructions (Addendum)
Please continue course of azithromycin and prednisone Hycodan at bedtime Tessalon during the day  May try levalbuterol inhaler instead  Honey Tea: Use 3 teaspoons of honey with juice squeezed from half lemon. Place shaved pieces of ginger into 1/2-1 cup of water and warm over stove top. Then mix the ingredients and repeat every 4 hours as needed.

## 2019-06-13 NOTE — Telephone Encounter (Signed)
Patient sent my chart message stating that she felt a heaviness in her chest, headache, and elevated systolic blood pressure.  Called patient to discuss.  She states the chest heaviness would be better described as having to "work harder to breath".  She has this sensation even at rest.  She was prescribed an albuterol inhaler, but did not get it because she states albuterol "does not agree" with her.  She does not sound as if she is in respiratory distress.  Advised her that since this is a new symptom, and she is feeling short of breath at rest that she should proceed to [facility] urgent care for evaluation today.  She also needs BP rechecked there as patient reports systolic BP was elevated when she checked at home.  Patient has someone who can drive her to urgent care.  She expressed understanding, and agrees with plan.

## 2019-06-13 NOTE — ED Triage Notes (Signed)
hallie weiters, pa at bedside  Breathing is worse

## 2019-06-13 NOTE — Telephone Encounter (Signed)
Will forward to PCP pool to notify Dr. Nolon Rod.

## 2019-06-14 ENCOUNTER — Encounter (INDEPENDENT_AMBULATORY_CARE_PROVIDER_SITE_OTHER): Payer: Self-pay

## 2019-06-14 NOTE — Telephone Encounter (Signed)
See triage note 06/13/2019

## 2019-06-14 NOTE — ED Provider Notes (Signed)
[facility]    CSN: [id number] Arrival date & time: 06/13/19  1551      History   Chief Complaint Chief Complaint  Patient presents with  . Cough    HPI [name] is a 73 y.o. female history of hypertension, hyperlipidemia, presenting today for evaluation of a cough.  Patient states that she has had cough and some shortness of breath.  Symptoms have been going on for approximately 1 to 2 weeks.  She tested negative for COVID last week.  She has been taking azithromycin as well as prednisone without improvement of symptoms.  She has 1 day left of azithromycin and 5 days left of prednisone.  She states that she cannot take albuterol as it makes her feel very jittery and increases her heart rate.  She has not had any fevers.  Denies tobacco use.  Does report a history of bronchitis.  Feels Tessalon did help.  Has used Hycodan at bedtime.  HPI  Past Medical History:  Diagnosis Date  . Arthritis   . Cancer ([place])   . Cataract   . Hypertension   . Injury of ligament of hand    right thumb ulnar collateral ligament injury.  recommend MR arthrogram of the thumb.  Marland Kitchen Neuromuscular disorder ([place])    scoliosis s/p spine surgery, has arthritis and pinched nerves    Patient Active Problem List   Diagnosis Date Noted  . Hyperlipidemia 03/06/2017  . History of left breast cancer 12/27/2013  . Injury of ulnar collateral ligament of wrist 02/02/2013  . Hypertension 09/25/2012  . Lumbar radiculopathy, chronic 09/25/2012  . Cervical radiculopathy 09/25/2012    Past Surgical History:  Procedure Laterality Date  . ABDOMINAL HYSTERECTOMY  2002   have ovaries  . BREAST SURGERY  2006   left, cancer  . EYE SURGERY  2012  . [place] and 2007  . TUBAL LIGATION      OB History   No obstetric history on file.      Home Medications    Prior to Admission medications   Medication Sig Start Date End Date Taking? Authorizing Provider  Zinc Sulfate (ZINC 15 PO)  Take by mouth.   Yes [provider]  Acetaminophen (TYLENOL ARTHRITIS PAIN PO) Take by mouth.    [provider]  albuterol (VENTOLIN HFA) 108 (90 Base) MCG/ACT inhaler Inhale 2 puffs into the lungs every 6 (six) hours as needed for wheezing or shortness of breath. Patient not taking: Reported on 06/10/2019 06/07/19   Waldon Merl, PA-C  aspirin 81 MG tablet Take 81 mg by mouth daily.    [provider]  azithromycin (ZITHROMAX) 250 MG tablet Sig as indicated 06/10/19   Horald Pollen, MD  b complex vitamins tablet Take 1 tablet by mouth daily.    [provider]  benzonatate (TESSALON) 200 MG capsule Take 1 capsule (200 mg total) by mouth 3 (three) times daily as needed for up to 7 days for cough. 06/13/19 06/20/19  Lynne Takemoto C, PA-C  Cholecalciferol (VITAMIN D) 2000 units CAPS Take by mouth.    [provider]  HYDROcodone-homatropine (HYCODAN) 5-1.5 MG/5ML syrup Take 5 mLs by mouth at bedtime as needed for cough. 06/10/19   Horald Pollen, MD  KRILL OIL PO Take by mouth.    [provider]  levalbuterol Penne Lash HFA) 45 MCG/ACT inhaler Inhale 1-2 puffs into the lungs every 4 (four) hours as needed for wheezing. 06/13/19  Georgian Mcclory C, PA-C  lisinopril (ZESTRIL) 10 MG tablet Take 1 tablet (10 mg total) by mouth daily. 06/04/19   Forrest Moron, MD  meloxicam (MOBIC) 15 MG tablet Take 1 tablet (15 mg total) by mouth daily. 06/04/19   Forrest Moron, MD  Multiple Vitamin (MULTIVITAMIN) tablet Take 1 tablet by mouth daily.    [provider]  OVER THE COUNTER MEDICATION Tart Cherry 135 mg taking BID    [provider]  predniSONE (DELTASONE) 20 MG tablet Take 2 tablets (40 mg total) by mouth daily with breakfast for 5 days. 06/10/19 06/15/19  Horald Pollen, MD  tizanidine (ZANAFLEX) 4 MG capsule Take 1 capsule (4 mg total) by mouth 3 (three) times daily as needed for muscle spasms. 06/04/19    Forrest Moron, MD  TURMERIC PO Take by mouth.    [provider]    Family History Family History  Problem Relation Age of Onset  . Diabetes Mother        age early 63's  . Heart disease Mother        age early 73's or 74's  . Hypertension Mother   . Hyperlipidemia Mother   . Stroke Father 60       per pt mini  . Diabetes Sister 61       type II  . Hyperlipidemia Sister   . Hypertension Sister   . Kidney disease Brother        polycystic kidney  . Diabetes Brother   . Heart disease Brother   . Alcohol abuse Maternal Grandfather   . Cancer Maternal Grandmother        Leukemia  . Cancer Paternal Aunt 67       Breast    Social History Social History   Tobacco Use  . Smoking status: Never Smoker  . Smokeless tobacco: Never Used  Substance Use Topics  . Alcohol use: No  . Drug use: No     Allergies   Albuterol, Codeine, and Pravachol [pravastatin sodium]   Review of Systems Review of Systems  Constitutional: Negative for activity change, appetite change, chills, fatigue and fever.  HENT: Negative for congestion, ear pain, rhinorrhea, sinus pressure, sore throat and trouble swallowing.   Eyes: Negative for discharge and redness.  Respiratory: Positive for cough, shortness of breath and wheezing. Negative for chest tightness.   Cardiovascular: Negative for chest pain.  Gastrointestinal: Negative for abdominal pain, diarrhea, nausea and vomiting.  Musculoskeletal: Negative for myalgias.  Skin: Negative for rash.  Neurological: Negative for dizziness, light-headedness and headaches.     Physical Exam Triage Vital Signs ED Triage Vitals  Enc Vitals Group     BP 06/13/19 1717 138/84     Pulse Rate 06/13/19 1717 88     Resp 06/13/19 1717 20     Temp 06/13/19 1717 98.3 F (36.8 C)     Temp src --      SpO2 06/13/19 1717 97 %     Weight --      Height --      Head Circumference --      Peak Flow --      Pain Score 06/13/19 1722 4     Pain Loc  --      Pain Edu? --      Excl. in [place]? --    No data found.  Updated Vital Signs BP 138/84 (BP Location: Left Arm)   Pulse 88   Temp 98.3  F (36.8 C)   Resp 20   SpO2 97%   Visual Acuity Right Eye Distance:   Left Eye Distance:   Bilateral Distance:    Right Eye Near:   Left Eye Near:    Bilateral Near:     Physical Exam Vitals signs and nursing note reviewed.  Constitutional:      General: She is not in acute distress.    Appearance: She is well-developed.  HENT:     Head: Normocephalic and atraumatic.     Ears:     Comments: Bilateral ears without tenderness to palpation of external auricle, tragus and mastoid, EAC's without erythema or swelling, TM's with good bony landmarks and cone of light. Non erythematous.     Mouth/Throat:     Comments: Oral mucosa pink and moist, no tonsillar enlargement or exudate. Posterior pharynx patent and nonerythematous, no uvula deviation or swelling. Normal phonation.  Eyes:     Conjunctiva/sclera: Conjunctivae normal.  Neck:     Musculoskeletal: Neck supple.  Cardiovascular:     Rate and Rhythm: Normal rate and regular rhythm.     Heart sounds: No murmur.  Pulmonary:     Effort: Pulmonary effort is normal. No respiratory distress.     Breath sounds: Wheezing present.     Comments: Occasional coughing in room, increased coughing with deep inspiration, wheezing and bronchospasm present with this, but is breathing comfortably at rest Abdominal:     Palpations: Abdomen is soft.     Tenderness: There is no abdominal tenderness.  Skin:    General: Skin is warm and dry.  Neurological:     Mental Status: She is alert.      UC Treatments / Results  Labs (all labs ordered are listed, but only abnormal results are displayed) Labs Reviewed - No data to display  EKG   Radiology Dg Chest 2 View  Result Date: 06/13/2019 CLINICAL DATA:  Cough and wheezing. EXAM: CHEST - 2 VIEW COMPARISON:  01/09/2015 FINDINGS: The lungs are  clear without focal pneumonia, edema, pneumothorax or pleural effusion. Lungs are hyperinflated interstitial markings are diffusely coarsened with chronic features. The cardiopericardial silhouette is within normal limits for size. The visualized bony structures of the thorax are intact. IMPRESSION: Hyperinflation without acute cardiopulmonary findings. Electronically Signed   By: Misty Stanley M.D.   On: 06/13/2019 18:11    Procedures Procedures (including critical care time)  Medications Ordered in UC Medications - No data to display  Initial Impression / Assessment and Plan / UC Course  I have reviewed the triage vital signs and the nursing notes.  Pertinent labs & imaging results that were available during my care of the patient were reviewed by me and considered in my medical decision making (see chart for details).     Chest x-ray negative for pneumonia.  Patient likely with bronchitis.  Is currently on appropriate regimen, advising her to finish azithromycin, finish course of prednisone.  Refilled Tessalon.  May use Hycodan at nighttime.  Did provide prescription for Aleve albuterol to see if this helps with shortness of breath and wheezing with less symptoms and albuterol.  Advised if these cause similar symptoms do not use..Discussed strict return precautions. Patient verbalized understanding and is agreeable with plan.  Final Clinical Impressions(s) / UC Diagnoses   Final diagnoses:  Bronchitis     Discharge Instructions     Please continue course of azithromycin and prednisone Hycodan at bedtime Tessalon during the day  May try levalbuterol inhaler  instead  Honey Tea: Use 3 teaspoons of honey with juice squeezed from half lemon. Place shaved pieces of ginger into 1/2-1 cup of water and warm over stove top. Then mix the ingredients and repeat every 4 hours as needed.    ED Prescriptions    Medication Sig Dispense Auth. Provider   benzonatate (TESSALON) 200 MG capsule  Take 1 capsule (200 mg total) by mouth 3 (three) times daily as needed for up to 7 days for cough. 28 capsule Atul Delucia C, PA-C   levalbuterol (XOPENEX HFA) 45 MCG/ACT inhaler Inhale 1-2 puffs into the lungs every 4 (four) hours as needed for wheezing. 1 Inhaler Thedford Bunton C, PA-C     Controlled Substance Prescriptions [place] Controlled Substance Registry consulted? Not Applicable   Janith Lima, [place] 06/14/19 1016

## 2019-06-15 NOTE — Telephone Encounter (Signed)
I am out of the office and so I would recommend she get a same day appointment. Please let the patient know that if her symptoms persist to go to urgent care.

## 2019-06-16 ENCOUNTER — Encounter (INDEPENDENT_AMBULATORY_CARE_PROVIDER_SITE_OTHER): Payer: Self-pay

## 2019-06-17 ENCOUNTER — Encounter (INDEPENDENT_AMBULATORY_CARE_PROVIDER_SITE_OTHER): Payer: Self-pay

## 2019-06-17 NOTE — Telephone Encounter (Signed)
Spoke with patient to schedule her an appt. Pt states that she is doing much better, and does not feel that she needs an appt at this time. She states she still has a little bit of a cough but overall she is doing great. Pt states that if she feels it is getting worse then she will give [place] a call. Pt was appreciative of the call to check on her.

## 2019-06-18 ENCOUNTER — Encounter (INDEPENDENT_AMBULATORY_CARE_PROVIDER_SITE_OTHER): Payer: Self-pay

## 2019-06-20 ENCOUNTER — Encounter (INDEPENDENT_AMBULATORY_CARE_PROVIDER_SITE_OTHER): Payer: Self-pay

## 2019-07-05 DIAGNOSIS — M542 Cervicalgia: Secondary | ICD-10-CM | POA: Diagnosis not present

## 2019-07-05 DIAGNOSIS — M545 Low back pain: Secondary | ICD-10-CM | POA: Diagnosis not present

## 2019-07-05 DIAGNOSIS — M5030 Other cervical disc degeneration, unspecified cervical region: Secondary | ICD-10-CM | POA: Diagnosis not present

## 2019-07-05 DIAGNOSIS — M47896 Other spondylosis, lumbar region: Secondary | ICD-10-CM | POA: Diagnosis not present

## 2019-07-19 ENCOUNTER — Encounter: Payer: Self-pay | Admitting: Family Medicine

## 2019-07-19 DIAGNOSIS — Z853 Personal history of malignant neoplasm of breast: Secondary | ICD-10-CM | POA: Diagnosis not present

## 2019-07-19 DIAGNOSIS — Z1231 Encounter for screening mammogram for malignant neoplasm of breast: Secondary | ICD-10-CM | POA: Diagnosis not present

## 2019-07-26 DIAGNOSIS — R69 Illness, unspecified: Secondary | ICD-10-CM | POA: Diagnosis not present

## 2019-09-14 DIAGNOSIS — M47816 Spondylosis without myelopathy or radiculopathy, lumbar region: Secondary | ICD-10-CM | POA: Diagnosis not present

## 2019-10-18 DIAGNOSIS — M5030 Other cervical disc degeneration, unspecified cervical region: Secondary | ICD-10-CM | POA: Diagnosis not present

## 2019-10-18 DIAGNOSIS — M47816 Spondylosis without myelopathy or radiculopathy, lumbar region: Secondary | ICD-10-CM | POA: Diagnosis not present

## 2019-11-04 ENCOUNTER — Encounter: Payer: Self-pay | Admitting: Family Medicine

## 2019-11-04 ENCOUNTER — Other Ambulatory Visit: Payer: Self-pay

## 2019-11-04 ENCOUNTER — Ambulatory Visit (INDEPENDENT_AMBULATORY_CARE_PROVIDER_SITE_OTHER): Payer: Medicare HMO | Admitting: Family Medicine

## 2019-11-04 VITALS — BP 150/84 | HR 98 | Temp 97.8°F | Ht 66.0 in | Wt 175.0 lb

## 2019-11-04 DIAGNOSIS — N281 Cyst of kidney, acquired: Secondary | ICD-10-CM

## 2019-11-04 DIAGNOSIS — I10 Essential (primary) hypertension: Secondary | ICD-10-CM

## 2019-11-04 LAB — BASIC METABOLIC PANEL
BUN/Creatinine Ratio: 20 (ref 12–28)
BUN: 19 mg/dL (ref 8–27)
CO2: 20 mmol/L (ref 20–29)
Calcium: 10 mg/dL (ref 8.7–10.3)
Chloride: 107 mmol/L — ABNORMAL HIGH (ref 96–106)
Creatinine, Ser: 0.94 mg/dL (ref 0.57–1.00)
GFR calc Af Amer: 70 mL/min/{1.73_m2} (ref >59)
GFR calc non Af Amer: 60 mL/min/{1.73_m2} (ref >59)
Glucose: 107 mg/dL — ABNORMAL HIGH (ref 65–99)
Potassium: 4.5 mmol/L (ref 3.5–5.2)
Sodium: 141 mmol/L (ref 134–144)

## 2019-11-04 MED ORDER — LISINOPRIL-HYDROCHLOROTHIAZIDE 10-12.5 MG PO TABS: 1.0000 | ORAL_TABLET | Freq: Every day | ORAL | 3 refills | Status: DC

## 2019-11-04 NOTE — Progress Notes (Signed)
Patient ID: [name], female    DOB: June 14, 1946  Age: 74 y.o. MRN: [id number]  Chief Complaint  Patient presents with  . Hypertension    Pt stated that her BP has been checking her BPx2 and she has noticed a trend going upward.  . score 9    Subjective:   Patient checks her blood pressures at home.  They have been running high recently.  In the past she usually ran in the 120s over 41s.  She has had up to about 170 over 100.  She is basically doing well.  Has the usual stresses in life.  The isolation of Covid has been difficult.  Her husband is developed some mild dementia which is difficult for her.  She does try and get some exercise several days a week taking a walk.  Otherwise she does not feel Hogan.  Her brother has polycystic kidney disease and she has been documented previously is having some cysts on her kidneys.  She is concerned about the effect of blood pressure on the kidneys.  Current allergies, medications, problem list, past/family and social histories reviewed.  Objective:  BP (!) 150/84 (BP Location: Left Arm, Patient Position: Sitting, Cuff Size: Normal)   Pulse 98   Temp 97.8 F (36.6 C) (Temporal)   Ht 5\' 6"  (1.676 m)   Wt 175 lb (79.4 kg)   SpO2 94%   BMI 28.25 kg/m   No acute distress.  Chest clear.  Heart regular without murmurs.  Assessment & Plan:   Assessment: 1. Essential hypertension   2. Kidney cysts       Plan: Will check renal functions because of her kidney cyst history.  Change her blood pressure medicine to lisinopril HCT.  She can continue to take that in the morning.  Orders Placed This Encounter  Procedures  . BMP    Meds ordered this encounter  Medications  . lisinopril-hydrochlorothiazide (ZESTORETIC) 10-12.5 MG tablet    Sig: Take 1 tablet by mouth daily.    Dispense:  90 tablet    Refill:  3         Patient Instructions   Discontinue taking the lisinopril.  Begin lisinopril HCT 10/12.51 daily  Monitor your  blood pressures, and if you continue running high either speak to Dr. Nolon Rod or return to see her.  Give it a few weeks.  Continue to try and get regular exercise.  Minimize salt intake.  If you can lose some weight, that is one of the best things for your blood pressure.  We will try to let you know the results of your kidney function tests sometime next week.    If you have lab work done today you will be contacted with your lab results within the next 2 weeks.  If you have not heard from [place] then please contact us. The fastest way to get your results is to register for My Chart.   IF you received an x-ray today, you will receive an invoice from [facility]. Please contact [facility] at [phone] with questions or concerns regarding your invoice.   IF you received labwork today, you will receive an invoice from [facility]. Please contact [facility] at [phone] with questions or concerns regarding your invoice.   Our billing staff will not be able to assist you with questions regarding bills from these companies.  You will be contacted with the lab results as soon as they are available. The fastest way to get your results  is to activate your My Chart account. Instructions are located on the last page of this paperwork. If you have not heard from [place] regarding the results in 2 weeks, please contact this office.        Return if symptoms worsen or fail to improve.   Ruben Reason, MD 11/04/2019

## 2019-11-04 NOTE — Patient Instructions (Addendum)
Discontinue taking the lisinopril.  Begin lisinopril HCT 10/12.51 daily  Monitor your blood pressures, and if you continue running high either speak to Dr. Nolon Rod or return to see her.  Give it a few weeks.  Continue to try and get regular exercise.  Minimize salt intake.  If you can lose some weight, that is one of the best things for your blood pressure.  We will try to let you know the results of your kidney function tests sometime next week.    If you have lab work done today you will be contacted with your lab results within the next 2 weeks.  If you have not heard from [place] then please contact us. The fastest way to get your results is to register for My Chart.   IF you received an x-ray today, you will receive an invoice from [facility]. Please contact [facility] at [phone] with questions or concerns regarding your invoice.   IF you received labwork today, you will receive an invoice from [facility]. Please contact [facility] at [phone] with questions or concerns regarding your invoice.   Our billing staff will not be able to assist you with questions regarding bills from these companies.  You will be contacted with the lab results as soon as they are available. The fastest way to get your results is to activate your My Chart account. Instructions are located on the last page of this paperwork. If you have not heard from [place] regarding the results in 2 weeks, please contact this office.

## 2019-11-07 NOTE — Progress Notes (Signed)
Labs normal.

## 2019-12-09 ENCOUNTER — Ambulatory Visit: Payer: Medicare HMO

## 2020-01-26 ENCOUNTER — Encounter: Payer: Self-pay | Admitting: Family Medicine

## 2020-01-26 NOTE — Telephone Encounter (Signed)
Patient states that she is currently taking Tizanidine  6 mg but its making her mouth very dry would like to know if she could decrease to 4mg . Patient also have some kind words for you. Please Advise

## 2020-01-30 MED ORDER — TIZANIDINE HCL 4 MG PO TABS: 4.0000 mg | ORAL_TABLET | Freq: Three times a day (TID) | ORAL | 6 refills | Status: DC | PRN

## 2020-02-11 DIAGNOSIS — M25562 Pain in left knee: Secondary | ICD-10-CM | POA: Diagnosis not present

## 2020-02-11 DIAGNOSIS — M25561 Pain in right knee: Secondary | ICD-10-CM | POA: Diagnosis not present

## 2020-02-11 DIAGNOSIS — M1711 Unilateral primary osteoarthritis, right knee: Secondary | ICD-10-CM | POA: Diagnosis not present

## 2020-02-14 ENCOUNTER — Telehealth: Payer: Medicare HMO | Admitting: Family Medicine

## 2020-02-14 ENCOUNTER — Telehealth: Payer: Self-pay | Admitting: Family Medicine

## 2020-02-21 DIAGNOSIS — R69 Illness, unspecified: Secondary | ICD-10-CM | POA: Diagnosis not present

## 2020-02-25 NOTE — Progress Notes (Addendum)
PCP - Delia Chimes, MD LOV 02-28-20  Cardiologist - N/A  No stimulator   Chest x-ray -  EKG - 02-28-20 Stress Test -  ECHO -  Cardiac Cath -   Current Labs in Epic dated 02-28-20  Sleep Study -  CPAP -   Fasting Blood Sugar -  Checks Blood Sugar _____ times a day  Blood Thinner Instructions: Aspirin Instructions: 81 mg ASA- Pt verbalized that she is to hold x  5 days. Pt plan to hold beginning 03-02-20 Last Dose:  Anesthesia review:   Patient denies shortness of breath, fever, cough and chest pain at PAT appointment   Patient verbalized understanding of instructions that were given to them at the PAT appointment. Patient was also instructed that they will need to review over the PAT instructions again at home before surgery.

## 2020-02-25 NOTE — Patient Instructions (Addendum)
DUE TO COVID-19 ONLY ONE VISITOR IS ALLOWED TO COME WITH YOU AND STAY IN THE WAITING ROOM ONLY DURING PRE OP AND PROCEDURE DAY OF SURGERY. THE 1 VISITOR MAY VISIT WITH YOU AFTER SURGERY IN YOUR PRIVATE ROOM DURING VISITING HOURS ONLY!  YOU NEED TO HAVE A COVID 19 TEST ON 03-03-20 @ 10:10 AM, THIS TEST MUST BE DONE BEFORE SURGERY, COME  [place], [address] [place] , [id number].  ([place]) ONCE YOUR COVID TEST IS COMPLETED, PLEASE BEGIN THE QUARANTINE INSTRUCTIONS AS OUTLINED IN YOUR HANDOUT.                [name]  02/25/2020   Your procedure is scheduled on: 03-07-20   Report to [facility] Main  Entrance    Report to admitting at 7:35 AM    Call this number if you have problems the morning of surgery [phone]    Remember: AFTER MIDNIGHT THE NIGHT PRIOR TO SURGERY. NOTHING BY MOUTH EXCEPT CLEAR LIQUIDS UNTIL 7:05 AM . PLEASE FINISH ENSURE DRINK PER SURGEON ORDER  WHICH NEEDS TO BE COMPLETED AT 7:05 AM .   CLEAR LIQUID DIET   Foods Allowed                                                                     Foods Excluded  Coffee and tea, regular and decaf                             liquids that you cannot  Plain Jell-O any favor except red or purple                                           see through such as: Fruit ices (not with fruit pulp)                                     milk, soups, orange juice  Iced Popsicles                                    All solid food Carbonated beverages, regular and diet                                    Cranberry, grape and apple juices Sports drinks like Gatorade Lightly seasoned clear broth or consume(fat free) Sugar, honey syrup   _____________________________________________________________________     Take these medicines the morning of surgery with A SIP OF WATER:  None  BRUSH YOUR TEETH MORNING OF SURGERY AND RINSE YOUR MOUTH OUT, NO CHEWING GUM CANDY OR MINTS.                                 You may not have any metal on your body including hair pins and  piercings     Do not wear jewelry, make-up, lotions, powders or perfumes, deodorant              Do not wear nail polish on your fingernails.  Do not shave  48 hours prior to surgery.                 Do not bring valuables to the hospital. [place].  Contacts, dentures or bridgework may not be worn into surgery.   You can bring a small overnight bag      Special Instructions: N/A              Please read over the following fact sheets you were given: _____________________________________________________________________             [facility] - Preparing for Surgery Before surgery, you can play an important role.  Because skin is not sterile, your skin needs to be as free of germs as possible.  You can reduce the number of germs on your skin by washing with CHG (chlorahexidine gluconate) soap before surgery.  CHG is an antiseptic cleaner which kills germs and bonds with the skin to continue killing germs even after washing. Please DO NOT use if you have an allergy to CHG or antibacterial soaps.  If your skin becomes reddened/irritated stop using the CHG and inform your nurse when you arrive at Short Stay. Do not shave (including legs and underarms) for at least 48 hours prior to the first CHG shower.  You may shave your face/neck. Please follow these instructions carefully:  1.  Shower with CHG Soap the night before surgery and the  morning of Surgery.  2.  If you choose to wash your hair, wash your hair first as usual with your  normal  shampoo.  3.  After you shampoo, rinse your hair and body thoroughly to remove the  shampoo.                           4.  Use CHG as you would any other liquid soap.  You can apply chg directly  to the skin and wash                       Gently with a scrungie or clean washcloth.  5.  Apply the CHG Soap to your body ONLY FROM  THE NECK DOWN.   Do not use on face/ open                           Wound or open sores. Avoid contact with eyes, ears mouth and genitals (private parts).                       Wash face,  Genitals (private parts) with your normal soap.             6.  Wash thoroughly, paying special attention to the area where your surgery  will be performed.  7.  Thoroughly rinse your body with warm water from the neck down.  8.  DO NOT shower/wash with your normal soap after using and rinsing off  the CHG Soap.                9.  Pat yourself  dry with a clean towel.            10.  Wear clean pajamas.            11.  Place clean sheets on your bed the night of your first shower and do not  sleep with pets. Day of Surgery : Do not apply any lotions/deodorants the morning of surgery.  Please wear clean clothes to the hospital/surgery center.  FAILURE TO FOLLOW THESE INSTRUCTIONS MAY RESULT IN THE CANCELLATION OF YOUR SURGERY PATIENT SIGNATURE_________________________________  NURSE SIGNATURE__________________________________  ________________________________________________________________________   [name]  An incentive spirometer is a tool that can help keep your lungs clear and active. This tool measures how well you are filling your lungs with each breath. Taking long deep breaths may help reverse or decrease the chance of developing breathing (pulmonary) problems (especially infection) following:  A long period of time when you are unable to move or be active. BEFORE THE PROCEDURE   If the spirometer includes an indicator to show your best effort, your nurse or respiratory therapist will set it to a desired goal.  If possible, sit up straight or lean slightly forward. Try not to slouch.  Hold the incentive spirometer in an upright position. INSTRUCTIONS FOR USE  1. Sit on the edge of your bed if possible, or sit up as far as you can in bed or on a chair. 2. Hold the incentive  spirometer in an upright position. 3. Breathe out normally. 4. Place the mouthpiece in your mouth and seal your lips tightly around it. 5. Breathe in slowly and as deeply as possible, raising the piston or the Ruppel toward the top of the column. 6. Hold your breath for 3-5 seconds or for as long as possible. Allow the piston or Courtney to fall to the bottom of the column. 7. Remove the mouthpiece from your mouth and breathe out normally. 8. Rest for a few seconds and repeat Steps 1 through 7 at least 10 times every 1-2 hours when you are awake. Take your time and take a few normal breaths between deep breaths. 9. The spirometer may include an indicator to show your best effort. Use the indicator as a goal to work toward during each repetition. 10. After each set of 10 deep breaths, practice coughing to be sure your lungs are clear. If you have an incision (the cut made at the time of surgery), support your incision when coughing by placing a pillow or rolled up towels firmly against it. Once you are able to get out of bed, walk around indoors and cough well. You may stop using the incentive spirometer when instructed by your caregiver.  RISKS AND COMPLICATIONS  Take your time so you do not get dizzy or light-headed.  If you are in pain, you may need to take or ask for pain medication before doing incentive spirometry. It is harder to take a deep breath if you are having pain. AFTER USE  Rest and breathe slowly and easily.  It can be helpful to keep track of a log of your progress. Your caregiver can provide you with a simple table to help with this. If you are using the spirometer at home, follow these instructions: [place] IF:   You are having difficultly using the spirometer.  You have trouble using the spirometer as often as instructed.  Your pain medication is not giving enough relief while using the spirometer.  You develop fever of 100.5 F (  38.1 C) or higher. SEEK  IMMEDIATE MEDICAL CARE IF:   You cough up bloody sputum that had not been present before.  You develop fever of 102 F (38.9 C) or greater.  You develop worsening pain at or near the incision site. MAKE SURE YOU:   Understand these instructions.  Will watch your condition.  Will get help right away if you are not doing well or get worse. Document Released: 02/17/2007 Document Revised: 12/30/2011 Document Reviewed: 04/20/2007 ExitCare Patient Information 2014 ExitCare, [place].   ________________________________________________________________________  WHAT IS A BLOOD TRANSFUSION? Blood Transfusion Information  A transfusion is the replacement of blood or some of its parts. Blood is made up of multiple cells which provide different functions.  Red blood cells carry oxygen and are used for blood loss replacement.  White blood cells fight against infection.  Platelets control bleeding.  Plasma helps clot blood.  Other blood products are available for specialized needs, such as hemophilia or other clotting disorders. BEFORE THE TRANSFUSION  Who gives blood for transfusions?   Healthy volunteers who are fully evaluated to make sure their blood is safe. This is blood bank blood. Transfusion therapy is the safest it has ever been in the practice of medicine. Before blood is taken from a donor, a complete history is taken to make sure that person has no history of diseases nor engages in risky social behavior (examples are intravenous drug use or sexual activity with multiple partners). The donor's travel history is screened to minimize risk of transmitting infections, such as malaria. The donated blood is tested for signs of infectious diseases, such as HIV and hepatitis. The blood is then tested to be sure it is compatible with you in order to minimize the chance of a transfusion reaction. If you or a relative donates blood, this is often done in anticipation of surgery and is not  appropriate for emergency situations. It takes many days to process the donated blood. RISKS AND COMPLICATIONS Although transfusion therapy is very safe and saves many lives, the main dangers of transfusion include:   Getting an infectious disease.  Developing a transfusion reaction. This is an allergic reaction to something in the blood you were given. Every precaution is taken to prevent this. The decision to have a blood transfusion has been considered carefully by your caregiver before blood is given. Blood is not given unless the benefits outweigh the risks. AFTER THE TRANSFUSION  Right after receiving a blood transfusion, you will usually feel much better and more energetic. This is especially true if your red blood cells have gotten low (anemic). The transfusion raises the level of the red blood cells which carry oxygen, and this usually causes an energy increase.  The nurse administering the transfusion will monitor you carefully for complications. HOME CARE INSTRUCTIONS  No special instructions are needed after a transfusion. You may find your energy is better. Speak with your caregiver about any limitations on activity for underlying diseases you may have. SEEK MEDICAL CARE IF:   Your condition is not improving after your transfusion.  You develop redness or irritation at the intravenous (IV) site. SEEK IMMEDIATE MEDICAL CARE IF:  Any of the following symptoms occur over the next 12 hours:  Shaking chills.  You have a temperature by mouth above 102 F (38.9 C), not controlled by medicine.  Chest, back, or muscle pain.  People around you feel you are not acting correctly or are confused.  Shortness of breath or difficulty breathing.  Dizziness and fainting.  You get a rash or develop hives.  You have a decrease in urine output.  Your urine turns a dark color or changes to pink, red, or brown. Any of the following symptoms occur over the next 10 days:  You have a  temperature by mouth above 102 F (38.9 C), not controlled by medicine.  Shortness of breath.  Weakness after normal activity.  The white part of the eye turns yellow (jaundice).  You have a decrease in the amount of urine or are urinating less often.  Your urine turns a dark color or changes to pink, red, or brown. Document Released: 10/04/2000 Document Revised: 12/30/2011 Document Reviewed: 05/23/2008 [facility] Patient Information 2014 [place], [place].  _______________________________________________________________________

## 2020-02-28 ENCOUNTER — Encounter: Payer: Self-pay | Admitting: Family Medicine

## 2020-02-28 ENCOUNTER — Ambulatory Visit (INDEPENDENT_AMBULATORY_CARE_PROVIDER_SITE_OTHER): Payer: Medicare HMO | Admitting: Family Medicine

## 2020-02-28 ENCOUNTER — Other Ambulatory Visit: Payer: Self-pay

## 2020-02-28 VITALS — BP 140/78 | HR 85 | Temp 98.0°F | Ht 65.0 in | Wt 177.6 lb

## 2020-02-28 DIAGNOSIS — Z01818 Encounter for other preprocedural examination: Secondary | ICD-10-CM

## 2020-02-28 DIAGNOSIS — M25562 Pain in left knee: Secondary | ICD-10-CM

## 2020-02-28 DIAGNOSIS — H9192 Unspecified hearing loss, left ear: Secondary | ICD-10-CM

## 2020-02-28 DIAGNOSIS — M1711 Unilateral primary osteoarthritis, right knee: Secondary | ICD-10-CM

## 2020-02-28 DIAGNOSIS — G8929 Other chronic pain: Secondary | ICD-10-CM | POA: Diagnosis not present

## 2020-02-28 DIAGNOSIS — M25561 Pain in right knee: Secondary | ICD-10-CM

## 2020-02-28 LAB — POCT URINALYSIS DIP (MANUAL ENTRY)
Bilirubin, UA: NEGATIVE
Blood, UA: NEGATIVE
Glucose, UA: NEGATIVE mg/dL
Ketones, POC UA: NEGATIVE mg/dL
Nitrite, UA: NEGATIVE
Protein Ur, POC: NEGATIVE mg/dL
Spec Grav, UA: 1.02 (ref 1.010–1.025)
Urobilinogen, UA: 0.2 E.U./dL
pH, UA: 7 (ref 5.0–8.0)

## 2020-02-28 NOTE — Patient Instructions (Addendum)
1.  Follow up with Surgeon  2. Follow up with Ear, Nose and Throat for audiology and hearing assessment 3.      Thursday, May 04, 2020 09:00 AM NEW PATIENT/HYPERTENSION- VIRTUAL VISIT     Tuesday, May 30, 2020 09:00 AM  MEDICARE WELLNESS- IN PERSON AT [facility]      If you have lab work done today you will be contacted with your lab results within the next 2 weeks.  If you have not heard from [place] then please contact us. The fastest way to get your results is to register for My Chart.   IF you received an x-ray today, you will receive an invoice from [facility]. Please contact [facility] at [phone] with questions or concerns regarding your invoice.   IF you received labwork today, you will receive an invoice from [facility]. Please contact [facility] at [phone] with questions or concerns regarding your invoice.   Our billing staff will not be able to assist you with questions regarding bills from these companies.  You will be contacted with the lab results as soon as they are available. The fastest way to get your results is to activate your My Chart account. Instructions are located on the last page of this paperwork. If you have not heard from [place] regarding the results in 2 weeks, please contact this office.

## 2020-02-28 NOTE — Progress Notes (Signed)
Established Patient Office Visit  Subjective:  Patient ID: [name], female    DOB: Jul 26, 1946  Age: 74 y.o. MRN: [id number]  CC:  Chief Complaint  Patient presents with  . surgical clearance    for knee replacement on the right knee on 03/07/20. Patient have a EKG on hold and scheduled for preop on 02/29/20    HPI [name] presents for clearance for right knee surgery scheduled on 03/07/20.  She has a history of OA of both knees     Past Medical History:  Diagnosis Date  . Arthritis   . Cancer ([place])   . Cataract   . Hypertension   . Injury of ligament of hand    right thumb ulnar collateral ligament injury.  recommend MR arthrogram of the thumb.  Marland Kitchen Neuromuscular disorder ([place])    scoliosis s/p spine surgery, has arthritis and pinched nerves    Past Surgical History:  Procedure Laterality Date  . ABDOMINAL HYSTERECTOMY  2002   have ovaries  . BREAST SURGERY  2006   left, cancer  . EYE SURGERY  2012  . [place] and 2007  . TUBAL LIGATION      Family History  Problem Relation Age of Onset  . Diabetes Mother        age early 83's  . Heart disease Mother        age early 73's or 2's  . Hypertension Mother   . Hyperlipidemia Mother   . Stroke Father 56       per pt mini  . Diabetes Sister 47       type II  . Hyperlipidemia Sister   . Hypertension Sister   . Kidney disease Brother        polycystic kidney  . Diabetes Brother   . Heart disease Brother   . Alcohol abuse Maternal Grandfather   . Cancer Maternal Grandmother        Leukemia  . Cancer Paternal Aunt 28       Breast    Social History   Socioeconomic History  . Marital status: Married    Spouse name: Not on file  . Number of children: Not on file  . Years of education: Not on file  . Highest education level: Not on file  Occupational History  . Occupation: Retired  Tobacco Use  . Smoking status: Never Smoker  . Smokeless tobacco: Never Used  Substance and Sexual  Activity  . Alcohol use: No  . Drug use: No  . Sexual activity: Yes  Other Topics Concern  . Not on file  Social History Narrative   Married   Education: [employer]   Exercise: two times a week   Social Determinants of Radio broadcast assistant Strain:   . Difficulty of Paying Living Expenses:   Food Insecurity:   . Worried About Charity fundraiser in the Last Year:   . Arboriculturist in the Last Year:   Transportation Needs:   . Film/video editor (Medical):   Marland Kitchen Lack of Transportation (Non-Medical):   Physical Activity:   . Days of Exercise per Week:   . Minutes of Exercise per Session:   Stress:   . Feeling of Stress :   Social Connections:   . Frequency of Communication with Friends and Family:   . Frequency of Social Gatherings with Friends and Family:   . Attends Religious Services:   . Active  Member of Clubs or Organizations:   . Attends Archivist Meetings:   Marland Kitchen Marital Status:   Intimate Partner Violence:   . Fear of Current or Ex-Partner:   . Emotionally Abused:   Marland Kitchen Physically Abused:   . Sexually Abused:     Outpatient Medications Prior to Visit  Medication Sig Dispense Refill  . aspirin 81 MG tablet Take 81 mg by mouth daily.    . baclofen (LIORESAL) 10 MG tablet Take 5 mg by mouth 3 (three) times daily as needed for muscle spasms.     . Cholecalciferol (VITAMIN D) 125 MCG (5000 UT) CAPS Take 5,000 Units by mouth daily.     Marland Kitchen lisinopril (ZESTRIL) 10 MG tablet Take 1 tablet (10 mg total) by mouth daily. 90 tablet 3  . meloxicam (MOBIC) 15 MG tablet Take 1 tablet (15 mg total) by mouth daily. 90 tablet 3  . Multiple Vitamin (MULTIVITAMIN) tablet Take 1 tablet by mouth daily.    . Omega-3 Fatty Acids (FISH OIL) 1000 MG CAPS Take 1,000 mg by mouth daily.    . S-Adenosylmethionine (SAM-E) 200 MG TABS Take 200 mg by mouth daily.    Marland Kitchen tiZANidine (ZANAFLEX) 4 MG tablet Take 1 tablet (4 mg total) by mouth every 8 (eight) hours as needed for muscle  spasms. 30 tablet 6  . TURMERIC PO Take 1,500 mg by mouth daily.     Marland Kitchen albuterol (VENTOLIN HFA) 108 (90 Base) MCG/ACT inhaler Inhale 2 puffs into the lungs every 6 (six) hours as needed for wheezing or shortness of breath. (Patient not taking: Reported on 06/10/2019) 6.7 g 0  . azithromycin (ZITHROMAX) 250 MG tablet Sig as indicated (Patient not taking: Reported on 11/04/2019) 6 tablet 0  . HYDROcodone-homatropine (HYCODAN) 5-1.5 MG/5ML syrup Take 5 mLs by mouth at bedtime as needed for cough. (Patient not taking: Reported on 11/04/2019) 120 mL 0  . levalbuterol (XOPENEX HFA) 45 MCG/ACT inhaler Inhale 1-2 puffs into the lungs every 4 (four) hours as needed for wheezing. (Patient not taking: Reported on 11/04/2019) 1 Inhaler 12  . lisinopril-hydrochlorothiazide (ZESTORETIC) 10-12.5 MG tablet Take 1 tablet by mouth daily. (Patient not taking: Reported on 02/16/2020) 90 tablet 3   No facility-administered medications prior to visit.    Allergies  Allergen Reactions  . Albuterol Other (See Comments)    Heart race, tremble and studdering  . Codeine Other (See Comments)    DIZZINESS  . Pravachol [Pravastatin Sodium] Other (See Comments)    Per patient caused headaches and GI upset    ROS Review of Systems Review of Systems  Constitutional: Negative for activity change, appetite change, chills and fever.  HENT: Negative for congestion, nosebleeds, trouble swallowing and voice change.  +decreased hearing lately Respiratory: Negative for cough, shortness of breath and wheezing.   Gastrointestinal: Negative for diarrhea, nausea and vomiting.  Genitourinary: Negative for difficulty urinating, dysuria, flank pain and hematuria.  Musculoskeletal: Negative for back pain, joint swelling and neck pain.  Neurological: Negative for dizziness, speech difficulty, light-headedness and numbness.  See HPI. All other review of systems negative.     Objective:    Physical Exam  BP 140/78 (BP Location: Right  Arm, Patient Position: Sitting, Cuff Size: Normal)   Pulse 85   Temp 98 F (36.7 C) (Temporal)   Ht '5\' 5"'  (1.651 m)   Wt 177 lb 9.6 oz (80.6 kg)   SpO2 96%   BMI 29.55 kg/m  Wt Readings from Last 3 Encounters:  02/28/20 177 lb 9.6 oz (80.6 kg)  11/04/19 175 lb (79.4 kg)  06/10/19 170 lb (77.1 kg)   BP 140/78 (BP Location: Right Arm, Patient Position: Sitting, Cuff Size: Normal)   Pulse 85   Temp 98 F (36.7 C) (Temporal)   Ht '5\' 5"'  (1.651 m)   Wt 177 lb 9.6 oz (80.6 kg)   SpO2 96%   BMI 29.55 kg/m   General Appearance:    Alert, cooperative, no distress, appears stated age  Head:    Normocephalic, without obvious abnormality, atraumatic  Eyes:    PERRL, conjunctiva/corneas clear, EOM's intact  Ears:    Normal TM's and external ear canals, both ears  Nose:   Nares normal, septum midline, mucosa normal, no drainage    or sinus tenderness  Neck:   Supple, symmetrical, trachea midline, no adenopathy;    thyroid:  no enlargement/tenderness/nodules  Back:     Symmetric, no curvature, ROM normal, no CVA tenderness  Lungs:     Clear to auscultation bilaterally, respirations unlabored  Chest Wall:    No tenderness or deformity   Heart:    Regular rate and rhythm, S1 and S2 normal, no murmur, rub   or gallop  Abdomen:     Soft, non-tender, bowel sounds active all four quadrants,    no masses, no organomegaly  Extremities:   Extremities normal, atraumatic, no cyanosis or edema Knee with valgus deformity on right  Pulses:   2+ and symmetric all extremities  Skin:   Skin color, texture, turgor normal, no rashes or lesions  Lymph nodes:   Cervical, supraclavicular, and axillary nodes normal  Neurologic:   CNII-XII intact, normal strength, sensation and reflexes    throughout   EKG - normal sinus rhythm    There are no preventive care reminders to display for this patient.  There are no preventive care reminders to display for this patient.  Lab Results  Component Value Date    TSH 1.180 03/04/2017   Lab Results  Component Value Date   WBC 7.0 03/04/2017   HGB 12.9 03/04/2017   HCT 40.6 03/04/2017   MCV 94 03/04/2017   PLT 259 03/04/2017   Lab Results  Component Value Date   NA 141 11/04/2019   K 4.5 11/04/2019   CO2 20 11/04/2019   GLUCOSE 107 (H) 11/04/2019   BUN 19 11/04/2019   CREATININE 0.94 11/04/2019   BILITOT 0.5 05/26/2019   ALKPHOS 49 05/26/2019   AST 17 05/26/2019   ALT 15 05/26/2019   PROT 7.0 05/26/2019   ALBUMIN 4.6 05/26/2019   CALCIUM 10.0 11/04/2019   Lab Results  Component Value Date   CHOL 258 (H) 05/26/2019   Lab Results  Component Value Date   HDL 41 05/26/2019   Lab Results  Component Value Date   LDLCALC 186 (H) 05/26/2019   Lab Results  Component Value Date   TRIG 155 (H) 05/26/2019   Lab Results  Component Value Date   CHOLHDL 6.3 (H) 05/26/2019   Lab Results  Component Value Date   HGBA1C 5.4 03/04/2017      Assessment & Plan:   Problem List Items Addressed This Visit    None    Visit Diagnoses    Pre-op examination    -  Primary   Relevant Orders   POCT urinalysis dipstick   EKG 12-Lead (Completed)   Protime-INR   APTT   CBC with Differential/Platelet   CMP14+EGFR   Arthritis of knee, right  Chronic pain of both knees       Decreased hearing of left ear       Relevant Orders   Ambulatory referral to ENT    referral placed for ENT for audiology  No orders of the defined types were placed in this encounter.   Follow-up: No follow-ups on file.    Forrest Moron, MD

## 2020-02-29 ENCOUNTER — Encounter (HOSPITAL_COMMUNITY): Payer: Self-pay

## 2020-02-29 ENCOUNTER — Encounter (HOSPITAL_COMMUNITY)
Admission: RE | Admit: 2020-02-29 | Discharge: 2020-02-29 | Disposition: A | Discharge status: Home / Self Care | Payer: Medicare HMO | Source: Ambulatory Visit | Attending: Orthopedic Surgery | Admitting: Orthopedic Surgery

## 2020-02-29 ENCOUNTER — Other Ambulatory Visit: Payer: Self-pay

## 2020-02-29 DIAGNOSIS — Z01818 Encounter for other preprocedural examination: Secondary | ICD-10-CM | POA: Diagnosis present

## 2020-02-29 DIAGNOSIS — I10 Essential (primary) hypertension: Secondary | ICD-10-CM | POA: Diagnosis not present

## 2020-02-29 HISTORY — DX: Nausea with vomiting, unspecified: R11.2

## 2020-02-29 HISTORY — DX: Other complications of anesthesia, initial encounter: T88.59XA

## 2020-02-29 HISTORY — DX: Nausea with vomiting, unspecified: Z98.890

## 2020-02-29 LAB — CBC WITH DIFFERENTIAL/PLATELET
Basophils Absolute: 0 10*3/uL (ref 0.0–0.2)
Basos: 1 %
EOS (ABSOLUTE): 0.1 10*3/uL (ref 0.0–0.4)
Eos: 2 %
Hematocrit: 39.2 % (ref 34.0–46.6)
Hemoglobin: 12.8 g/dL (ref 11.1–15.9)
Immature Grans (Abs): 0 10*3/uL (ref 0.0–0.1)
Immature Granulocytes: 0 %
Lymphocytes Absolute: 2.1 10*3/uL (ref 0.7–3.1)
Lymphs: 39 %
MCH: 30.4 pg (ref 26.6–33.0)
MCHC: 32.7 g/dL (ref 31.5–35.7)
MCV: 93 fL (ref 79–97)
Monocytes Absolute: 0.4 10*3/uL (ref 0.1–0.9)
Monocytes: 8 %
Neutrophils Absolute: 2.7 10*3/uL (ref 1.4–7.0)
Neutrophils: 50 %
Platelets: 219 10*3/uL (ref 150–450)
RBC: 4.21 x10E6/uL (ref 3.77–5.28)
RDW: 12.2 % (ref 11.7–15.4)
WBC: 5.4 10*3/uL (ref 3.4–10.8)

## 2020-02-29 LAB — CMP14+EGFR
ALT: 18 IU/L (ref 0–32)
AST: 17 IU/L (ref 0–40)
Albumin/Globulin Ratio: 1.7 (ref 1.2–2.2)
Albumin: 4.3 g/dL (ref 3.7–4.7)
Alkaline Phosphatase: 52 IU/L (ref 39–117)
BUN/Creatinine Ratio: 15 (ref 12–28)
BUN: 15 mg/dL (ref 8–27)
Bilirubin Total: 0.4 mg/dL (ref 0.0–1.2)
CO2: 20 mmol/L (ref 20–29)
Calcium: 9.6 mg/dL (ref 8.7–10.3)
Chloride: 107 mmol/L — ABNORMAL HIGH (ref 96–106)
Creatinine, Ser: 0.97 mg/dL (ref 0.57–1.00)
GFR calc Af Amer: 67 mL/min/{1.73_m2} (ref >59)
GFR calc non Af Amer: 58 mL/min/{1.73_m2} — ABNORMAL LOW (ref >59)
Globulin, Total: 2.5 g/dL (ref 1.5–4.5)
Glucose: 99 mg/dL (ref 65–99)
Potassium: 4.3 mmol/L (ref 3.5–5.2)
Sodium: 142 mmol/L (ref 134–144)
Total Protein: 6.8 g/dL (ref 6.0–8.5)

## 2020-02-29 LAB — SURGICAL PCR SCREEN
MRSA, PCR: NEGATIVE
Staphylococcus aureus: NEGATIVE

## 2020-02-29 LAB — PROTIME-INR
INR: 1 (ref 0.9–1.2)
Prothrombin Time: 10.6 s (ref 9.1–12.0)

## 2020-02-29 LAB — APTT: aPTT: 27 s (ref 24–33)

## 2020-03-01 ENCOUNTER — Encounter: Payer: Medicare HMO | Admitting: Adult Health Nurse Practitioner

## 2020-03-01 LAB — ABO/RH: ABO/RH(D): A POS

## 2020-03-02 ENCOUNTER — Encounter: Payer: Self-pay | Admitting: Family Medicine

## 2020-03-03 ENCOUNTER — Other Ambulatory Visit (HOSPITAL_COMMUNITY)
Admission: RE | Admit: 2020-03-03 | Discharge: 2020-03-03 | Disposition: A | Discharge status: Home / Self Care | Payer: Medicare HMO | Source: Ambulatory Visit | Attending: Orthopedic Surgery | Admitting: Orthopedic Surgery

## 2020-03-03 DIAGNOSIS — Z20822 Contact with and (suspected) exposure to covid-19: Secondary | ICD-10-CM | POA: Diagnosis not present

## 2020-03-03 DIAGNOSIS — Z01812 Encounter for preprocedural laboratory examination: Secondary | ICD-10-CM | POA: Diagnosis not present

## 2020-03-03 LAB — SARS CORONAVIRUS 2 (TAT 6-24 HRS): SARS Coronavirus 2: NEGATIVE

## 2020-03-03 NOTE — H&P (Signed)
TOTAL KNEE ADMISSION H&P  Patient is being admitted for right total knee arthroplasty.  Subjective:  Chief Complaint:   Right knee primary OA / pain  HPI: [name], 74 y.o. female, has a history of pain and functional disability in the right knee due to arthritis and has failed non-surgical conservative treatments for greater than 12 weeks to includeNSAID's and/or analgesics, corticosteriod injections and activity modification.  Onset of symptoms was gradual, starting 4-5 years ago with gradually worsening course since that time. The patient noted no past surgery on the right knee(s).  Patient currently rates pain in the right knee(s) at 10 out of 10 with activity. Patient has night pain, worsening of pain with activity and weight bearing, pain that interferes with activities of daily living, pain with passive range of motion, crepitus and joint swelling.  Patient has evidence of periarticular osteophytes and joint space narrowing by imaging studies.  There is no active infection.  Risks, benefits and expectations were discussed with the patient.  Risks including but not limited to the risk of anesthesia, blood clots, nerve damage, blood vessel damage, failure of the prosthesis, infection and up to and including death.  Patient understand the risks, benefits and expectations and wishes to proceed with surgery.   PCP: Forrest Moron, MD  D/C Plans:       Home  Post-op Meds:       No Rx given   Tranexamic Acid:      To be given - IV   Decadron:      Is to be given  FYI:      ASA  [place]  PT:   OPPT  Pharmacy: [facility] [address] [place]   Patient Active Problem List   Diagnosis Date Noted  . Hyperlipidemia 03/06/2017  . History of left breast cancer 12/27/2013  . Injury of ulnar collateral ligament of wrist 02/02/2013  . Hypertension 09/25/2012  . Lumbar radiculopathy, chronic 09/25/2012  . Cervical radiculopathy 09/25/2012   Past Medical History:  Diagnosis Date  . Arthritis   .  Cancer ([place]) 01/07/2006   Breast cancer- L Breast  . Cataract   . Complication of anesthesia   . Hypertension   . Injury of ligament of hand    right thumb ulnar collateral ligament injury.  recommend MR arthrogram of the thumb.  Marland Kitchen Neuromuscular disorder ([place])    scoliosis s/p spine surgery, has arthritis and pinched nerves  . PONV (postoperative nausea and vomiting)     Past Surgical History:  Procedure Laterality Date  . ABDOMINAL HYSTERECTOMY  2002   have ovaries  . BREAST SURGERY  2006   left, cancer  . EYE SURGERY  2012  . [place] and 2007  . TUBAL LIGATION      No current facility-administered medications for this encounter.   Current Outpatient Medications  Medication Sig Dispense Refill Last Dose  . aspirin 81 MG tablet Take 81 mg by mouth daily.     . baclofen (LIORESAL) 10 MG tablet Take 5 mg by mouth 3 (three) times daily as needed for muscle spasms.      . Cholecalciferol (VITAMIN D) 125 MCG (5000 UT) CAPS Take 5,000 Units by mouth daily.      Marland Kitchen lisinopril (ZESTRIL) 10 MG tablet Take 1 tablet (10 mg total) by mouth daily. 90 tablet 3   . Multiple Vitamin (MULTIVITAMIN) tablet Take 1 tablet by mouth daily.     . Omega-3 Fatty Acids (FISH  OIL) 1000 MG CAPS Take 1,000 mg by mouth daily.     . S-Adenosylmethionine (SAM-E) 200 MG TABS Take 200 mg by mouth daily.     . TURMERIC PO Take 1,500 mg by mouth daily.      . meloxicam (MOBIC) 15 MG tablet Take 1 tablet (15 mg total) by mouth daily. 90 tablet 3 Not Taking at Unknown time  . tiZANidine (ZANAFLEX) 4 MG tablet Take 1 tablet (4 mg total) by mouth every 8 (eight) hours as needed for muscle spasms. 30 tablet 6 Not Taking at Unknown time   Allergies  Allergen Reactions  . Albuterol Other (See Comments)    Heart race, tremble and studdering  . Codeine Other (See Comments)    DIZZINESS  . Pravachol [Pravastatin Sodium] Other (See Comments)    Per patient caused headaches and GI upset    Social History    Tobacco Use  . Smoking status: Never Smoker  . Smokeless tobacco: Never Used  Substance Use Topics  . Alcohol use: No    Family History  Problem Relation Age of Onset  . Diabetes Mother        age early 42's  . Heart disease Mother        age early 46's or 28's  . Hypertension Mother   . Hyperlipidemia Mother   . Stroke Father 4       per pt mini  . Diabetes Sister 36       type II  . Hyperlipidemia Sister   . Hypertension Sister   . Kidney disease Brother        polycystic kidney  . Diabetes Brother   . Heart disease Brother   . Alcohol abuse Maternal Grandfather   . Cancer Maternal Grandmother        Leukemia  . Cancer Paternal Aunt 81       Breast     Review of Systems  Constitutional: Negative.   HENT: Negative.   Eyes: Negative.   Respiratory: Negative.   Cardiovascular: Negative.   Gastrointestinal: Negative.   Genitourinary: Negative.   Musculoskeletal: Positive for joint pain.  Skin: Negative.   Neurological: Negative.   Endo/Heme/Allergies: Positive for environmental allergies.  Psychiatric/Behavioral: Negative.        Objective:  Physical Exam  Constitutional: She is oriented to person, place, and time. She appears well-developed.  HENT:  Head: Normocephalic.  Eyes: Pupils are equal, round, and reactive to light.  Neck: No JVD present. No tracheal deviation present. No thyromegaly present.  Cardiovascular: Normal rate, regular rhythm and intact distal pulses.  Respiratory: Effort normal and breath sounds normal. No respiratory distress. She has no wheezes.  GI: Soft. There is no abdominal tenderness. There is no guarding.  Musculoskeletal:     Cervical back: Neck supple.     Right knee: Swelling and bony tenderness present. No deformity, erythema, ecchymosis or lacerations. Tenderness present.  Lymphadenopathy:    She has no cervical adenopathy.  Neurological: She is alert and oriented to person, place, and time. A sensory deficit  (tingling in bilateral feet at night) is present.  Skin: Skin is warm and dry.  Psychiatric: She has a normal mood and affect.       Labs:  Estimated body mass index is 29.71 kg/m as calculated from the following:   Height as of 02/29/20: 5\' 5"  (1.651 m).   Weight as of 02/29/20: 81 kg.   Imaging Review Plain radiographs demonstrate severe degenerative joint disease of  the right knee(s). The bone quality appears to be good for age and reported activity level.      Assessment/Plan:  End stage arthritis, right knee   The patient history, physical examination, clinical judgment of the provider and imaging studies are consistent with end stage degenerative joint disease of the right knee(s) and total knee arthroplasty is deemed medically necessary. The treatment options including medical management, injection therapy arthroscopy and arthroplasty were discussed at length. The risks and benefits of total knee arthroplasty were presented and reviewed. The risks due to aseptic loosening, infection, stiffness, patella tracking problems, thromboembolic complications and other imponderables were discussed. The patient acknowledged the explanation, agreed to proceed with the plan and consent was signed. Patient is being admitted for inpatient treatment for surgery, pain control, PT, OT, prophylactic antibiotics, VTE prophylaxis, progressive ambulation and ADL's and discharge planning. The patient is planning to be discharged home.     Patient's anticipated LOS is less than 2 midnights, meeting these requirements: - Lives within 1 hour of care - Has a competent adult at home to recover with post-op recover - NO history of  - Chronic pain requiring opiods  - Diabetes  - Coronary Artery Disease  - Heart failure  - Heart attack  - Stroke  - DVT/VTE  - Cardiac arrhythmia  - Respiratory Failure/COPD  - Renal failure  - Anemia  - Advanced Liver disease

## 2020-03-07 ENCOUNTER — Other Ambulatory Visit: Payer: Self-pay

## 2020-03-07 ENCOUNTER — Ambulatory Visit (HOSPITAL_COMMUNITY): Payer: Medicare HMO | Admitting: Anesthesiology

## 2020-03-07 ENCOUNTER — Encounter (HOSPITAL_COMMUNITY)
Admission: RE | Disposition: A | Discharge status: Home / Self Care | Payer: Self-pay | Source: Home / Self Care | Attending: Orthopedic Surgery

## 2020-03-07 ENCOUNTER — Observation Stay (HOSPITAL_COMMUNITY)
Admission: RE | Admit: 2020-03-07 | Discharge: 2020-03-08 | Disposition: A | Discharge status: Home / Self Care | Payer: Medicare HMO | Attending: Orthopedic Surgery | Admitting: Orthopedic Surgery

## 2020-03-07 ENCOUNTER — Encounter (HOSPITAL_COMMUNITY): Payer: Self-pay | Admitting: Orthopedic Surgery

## 2020-03-07 DIAGNOSIS — E1136 Type 2 diabetes mellitus with diabetic cataract: Secondary | ICD-10-CM | POA: Diagnosis not present

## 2020-03-07 DIAGNOSIS — I2510 Atherosclerotic heart disease of native coronary artery without angina pectoris: Secondary | ICD-10-CM | POA: Diagnosis not present

## 2020-03-07 DIAGNOSIS — Z7982 Long term (current) use of aspirin: Secondary | ICD-10-CM | POA: Insufficient documentation

## 2020-03-07 DIAGNOSIS — Z888 Allergy status to other drugs, medicaments and biological substances status: Secondary | ICD-10-CM | POA: Diagnosis not present

## 2020-03-07 DIAGNOSIS — Z791 Long term (current) use of non-steroidal anti-inflammatories (NSAID): Secondary | ICD-10-CM | POA: Diagnosis not present

## 2020-03-07 DIAGNOSIS — Z833 Family history of diabetes mellitus: Secondary | ICD-10-CM | POA: Diagnosis not present

## 2020-03-07 DIAGNOSIS — Z8249 Family history of ischemic heart disease and other diseases of the circulatory system: Secondary | ICD-10-CM | POA: Diagnosis not present

## 2020-03-07 DIAGNOSIS — Z96651 Presence of right artificial knee joint: Secondary | ICD-10-CM

## 2020-03-07 DIAGNOSIS — I10 Essential (primary) hypertension: Secondary | ICD-10-CM | POA: Insufficient documentation

## 2020-03-07 DIAGNOSIS — E785 Hyperlipidemia, unspecified: Secondary | ICD-10-CM | POA: Diagnosis not present

## 2020-03-07 DIAGNOSIS — Z79899 Other long term (current) drug therapy: Secondary | ICD-10-CM | POA: Diagnosis not present

## 2020-03-07 DIAGNOSIS — R262 Difficulty in walking, not elsewhere classified: Secondary | ICD-10-CM | POA: Insufficient documentation

## 2020-03-07 DIAGNOSIS — J449 Chronic obstructive pulmonary disease, unspecified: Secondary | ICD-10-CM | POA: Diagnosis not present

## 2020-03-07 DIAGNOSIS — G709 Myoneural disorder, unspecified: Secondary | ICD-10-CM | POA: Diagnosis not present

## 2020-03-07 DIAGNOSIS — E663 Overweight: Secondary | ICD-10-CM | POA: Diagnosis present

## 2020-03-07 DIAGNOSIS — Z885 Allergy status to narcotic agent status: Secondary | ICD-10-CM | POA: Insufficient documentation

## 2020-03-07 DIAGNOSIS — M1711 Unilateral primary osteoarthritis, right knee: Principal | ICD-10-CM | POA: Insufficient documentation

## 2020-03-07 DIAGNOSIS — Z96659 Presence of unspecified artificial knee joint: Secondary | ICD-10-CM

## 2020-03-07 DIAGNOSIS — G8918 Other acute postprocedural pain: Secondary | ICD-10-CM | POA: Diagnosis not present

## 2020-03-07 HISTORY — PX: TOTAL KNEE ARTHROPLASTY: SHX125

## 2020-03-07 LAB — TYPE AND SCREEN
ABO/RH(D): A POS
Antibody Screen: NEGATIVE

## 2020-03-07 SURGERY — ARTHROPLASTY, KNEE, TOTAL
Anesthesia: Spinal | Site: Knee | Laterality: Right

## 2020-03-07 MED ORDER — CHLORHEXIDINE GLUCONATE 0.12 % MT SOLN
15.0000 mL | Freq: Once | OROMUCOSAL | Status: AC
  Administered 2020-03-07: 15 mL via OROMUCOSAL

## 2020-03-07 MED ORDER — BUPIVACAINE HCL (PF) 0.25 % IJ SOLN
INTRAMUSCULAR | Status: DC | PRN
  Administered 2020-03-07: 30 mL

## 2020-03-07 MED ORDER — KETOROLAC TROMETHAMINE 30 MG/ML IJ SOLN
INTRAMUSCULAR | Status: DC | PRN
  Administered 2020-03-07: 30 mg

## 2020-03-07 MED ORDER — HYDROCODONE-ACETAMINOPHEN 5-325 MG PO TABS
1.0000 | ORAL_TABLET | ORAL | Status: DC | PRN
  Administered 2020-03-07 – 2020-03-08 (×3): 1 via ORAL
  Filled 2020-03-07 (×3): qty 1

## 2020-03-07 MED ORDER — ONDANSETRON HCL 4 MG/2ML IJ SOLN
INTRAMUSCULAR | Status: DC | PRN
Start: 2020-03-07 — End: 2020-03-07
  Administered 2020-03-07: 4 mg via INTRAVENOUS

## 2020-03-07 MED ORDER — CELECOXIB 200 MG PO CAPS
200.0000 mg | ORAL_CAPSULE | Freq: Two times a day (BID) | ORAL | Status: DC
  Administered 2020-03-07 – 2020-03-08 (×2): 200 mg via ORAL
  Filled 2020-03-07 (×2): qty 1

## 2020-03-07 MED ORDER — CEFAZOLIN SODIUM-DEXTROSE 2-4 GM/100ML-% IV SOLN
2.0000 g | INTRAVENOUS | Status: AC
  Administered 2020-03-07: 2 g via INTRAVENOUS
  Filled 2020-03-07: qty 100

## 2020-03-07 MED ORDER — PHENOL 1.4 % MT LIQD: 1.0000 | OROMUCOSAL | Status: DC | PRN

## 2020-03-07 MED ORDER — STERILE WATER FOR IRRIGATION IR SOLN
Status: DC | PRN
  Administered 2020-03-07: 2000 mL

## 2020-03-07 MED ORDER — BISACODYL 10 MG RE SUPP: 10.0000 mg | Freq: Every day | RECTAL | Status: DC | PRN

## 2020-03-07 MED ORDER — DEXAMETHASONE SODIUM PHOSPHATE 10 MG/ML IJ SOLN
INTRAMUSCULAR | Status: AC
  Filled 2020-03-07: qty 1

## 2020-03-07 MED ORDER — FENTANYL CITRATE (PF) 100 MCG/2ML IJ SOLN
INTRAMUSCULAR | Status: DC | PRN
  Administered 2020-03-07: 50 ug via INTRAVENOUS

## 2020-03-07 MED ORDER — HYDROCODONE-ACETAMINOPHEN 7.5-325 MG PO TABS: 1.0000 | ORAL_TABLET | ORAL | Status: DC | PRN

## 2020-03-07 MED ORDER — METOCLOPRAMIDE HCL 5 MG/ML IJ SOLN: 5.0000 mg | Freq: Three times a day (TID) | INTRAMUSCULAR | Status: DC | PRN

## 2020-03-07 MED ORDER — SODIUM CHLORIDE (PF) 0.9 % IJ SOLN
INTRAMUSCULAR | Status: DC | PRN
  Administered 2020-03-07: 50 mL

## 2020-03-07 MED ORDER — DIPHENHYDRAMINE HCL 12.5 MG/5ML PO ELIX: 12.5000 mg | ORAL_SOLUTION | ORAL | Status: DC | PRN

## 2020-03-07 MED ORDER — BUPIVACAINE IN DEXTROSE 0.75-8.25 % IT SOLN
INTRATHECAL | Status: DC | PRN
Start: 2020-03-07 — End: 2020-03-07
  Administered 2020-03-07: 1.4 mL via INTRATHECAL

## 2020-03-07 MED ORDER — SODIUM CHLORIDE (PF) 0.9 % IJ SOLN
INTRAMUSCULAR | Status: AC
  Filled 2020-03-07: qty 50

## 2020-03-07 MED ORDER — FENTANYL CITRATE (PF) 100 MCG/2ML IJ SOLN
50.0000 ug | INTRAMUSCULAR | Status: DC
  Administered 2020-03-07: 75 ug via INTRAVENOUS
  Filled 2020-03-07: qty 2

## 2020-03-07 MED ORDER — METHOCARBAMOL 500 MG IVPB - SIMPLE MED
500.0000 mg | Freq: Four times a day (QID) | INTRAVENOUS | Status: DC | PRN
  Administered 2020-03-07: 500 mg via INTRAVENOUS
  Filled 2020-03-07: qty 50

## 2020-03-07 MED ORDER — SODIUM CHLORIDE 0.9 % IV SOLN: INTRAVENOUS | Status: DC

## 2020-03-07 MED ORDER — METHOCARBAMOL 500 MG IVPB - SIMPLE MED
INTRAVENOUS | Status: AC
  Filled 2020-03-07: qty 50

## 2020-03-07 MED ORDER — FENTANYL CITRATE (PF) 100 MCG/2ML IJ SOLN
INTRAMUSCULAR | Status: AC
  Filled 2020-03-07: qty 2

## 2020-03-07 MED ORDER — POVIDONE-IODINE 10 % EX SWAB
2.0000 "application " | Freq: Once | CUTANEOUS | Status: AC
  Administered 2020-03-07: 2 via TOPICAL

## 2020-03-07 MED ORDER — PHENYLEPHRINE HCL-NACL 10-0.9 MG/250ML-% IV SOLN
INTRAVENOUS | Status: DC | PRN
  Administered 2020-03-07: 30 ug/min via INTRAVENOUS

## 2020-03-07 MED ORDER — PROPOFOL 500 MG/50ML IV EMUL
INTRAVENOUS | Status: DC | PRN
  Administered 2020-03-07: 50 ug/kg/min via INTRAVENOUS

## 2020-03-07 MED ORDER — HYDROMORPHONE HCL 1 MG/ML IJ SOLN: 0.5000 mg | INTRAMUSCULAR | Status: DC | PRN

## 2020-03-07 MED ORDER — ONDANSETRON HCL 4 MG/2ML IJ SOLN
INTRAMUSCULAR | Status: AC
  Filled 2020-03-07: qty 2

## 2020-03-07 MED ORDER — ACETAMINOPHEN 325 MG PO TABS: 325.0000 mg | ORAL_TABLET | Freq: Four times a day (QID) | ORAL | Status: DC | PRN

## 2020-03-07 MED ORDER — PROPOFOL 10 MG/ML IV BOLUS
INTRAVENOUS | Status: AC
  Filled 2020-03-07: qty 20

## 2020-03-07 MED ORDER — DEXAMETHASONE SODIUM PHOSPHATE 10 MG/ML IJ SOLN
10.0000 mg | Freq: Once | INTRAMUSCULAR | Status: AC
  Administered 2020-03-07: 8 mg via INTRAVENOUS

## 2020-03-07 MED ORDER — MIDAZOLAM HCL 2 MG/2ML IJ SOLN
INTRAMUSCULAR | Status: AC
  Filled 2020-03-07: qty 2

## 2020-03-07 MED ORDER — METOCLOPRAMIDE HCL 5 MG PO TABS: 5.0000 mg | ORAL_TABLET | Freq: Three times a day (TID) | ORAL | Status: DC | PRN

## 2020-03-07 MED ORDER — 0.9 % SODIUM CHLORIDE (POUR BTL) OPTIME
TOPICAL | Status: DC | PRN
  Administered 2020-03-07: 1000 mL

## 2020-03-07 MED ORDER — ALUM & MAG HYDROXIDE-SIMETH 200-200-20 MG/5ML PO SUSP: 15.0000 mL | ORAL | Status: DC | PRN

## 2020-03-07 MED ORDER — PHENYLEPHRINE HCL (PRESSORS) 10 MG/ML IV SOLN
INTRAVENOUS | Status: AC
  Filled 2020-03-07: qty 1

## 2020-03-07 MED ORDER — SODIUM CHLORIDE 0.9 % IR SOLN
Status: DC | PRN
  Administered 2020-03-07: 1000 mL

## 2020-03-07 MED ORDER — ONDANSETRON HCL 4 MG/2ML IJ SOLN: 4.0000 mg | Freq: Four times a day (QID) | INTRAMUSCULAR | Status: DC | PRN

## 2020-03-07 MED ORDER — MIDAZOLAM HCL 2 MG/2ML IJ SOLN
1.0000 mg | INTRAMUSCULAR | Status: DC
  Filled 2020-03-07: qty 2

## 2020-03-07 MED ORDER — DOCUSATE SODIUM 100 MG PO CAPS
100.0000 mg | ORAL_CAPSULE | Freq: Two times a day (BID) | ORAL | Status: DC
  Administered 2020-03-07 – 2020-03-08 (×2): 100 mg via ORAL
  Filled 2020-03-07 (×2): qty 1

## 2020-03-07 MED ORDER — KETOROLAC TROMETHAMINE 30 MG/ML IJ SOLN
INTRAMUSCULAR | Status: AC
  Filled 2020-03-07: qty 1

## 2020-03-07 MED ORDER — FERROUS SULFATE 325 (65 FE) MG PO TABS
325.0000 mg | ORAL_TABLET | Freq: Two times a day (BID) | ORAL | Status: DC
  Filled 2020-03-07 (×2): qty 1

## 2020-03-07 MED ORDER — METHOCARBAMOL 500 MG PO TABS
500.0000 mg | ORAL_TABLET | Freq: Four times a day (QID) | ORAL | Status: DC | PRN
  Administered 2020-03-07: 500 mg via ORAL
  Filled 2020-03-07: qty 1

## 2020-03-07 MED ORDER — LACTATED RINGERS IV SOLN: INTRAVENOUS | Status: DC

## 2020-03-07 MED ORDER — MIDAZOLAM HCL 5 MG/5ML IJ SOLN
INTRAMUSCULAR | Status: DC | PRN
  Administered 2020-03-07: 1 mg via INTRAVENOUS

## 2020-03-07 MED ORDER — ASPIRIN 81 MG PO CHEW
81.0000 mg | CHEWABLE_TABLET | Freq: Two times a day (BID) | ORAL | Status: DC
  Administered 2020-03-07 – 2020-03-08 (×2): 81 mg via ORAL
  Filled 2020-03-07 (×2): qty 1

## 2020-03-07 MED ORDER — CEFAZOLIN SODIUM-DEXTROSE 2-4 GM/100ML-% IV SOLN
2.0000 g | Freq: Four times a day (QID) | INTRAVENOUS | Status: AC
  Administered 2020-03-07 (×2): 2 g via INTRAVENOUS
  Filled 2020-03-07 (×2): qty 100

## 2020-03-07 MED ORDER — MAGNESIUM CITRATE PO SOLN: 1.0000 | Freq: Once | ORAL | Status: DC | PRN

## 2020-03-07 MED ORDER — ONDANSETRON HCL 4 MG PO TABS: 4.0000 mg | ORAL_TABLET | Freq: Four times a day (QID) | ORAL | Status: DC | PRN

## 2020-03-07 MED ORDER — HYDROMORPHONE HCL 1 MG/ML IJ SOLN
0.2500 mg | INTRAMUSCULAR | Status: DC | PRN
  Administered 2020-03-07 (×2): 0.5 mg via INTRAVENOUS

## 2020-03-07 MED ORDER — HYDROMORPHONE HCL 1 MG/ML IJ SOLN
INTRAMUSCULAR | Status: AC
  Filled 2020-03-07: qty 1

## 2020-03-07 MED ORDER — TRANEXAMIC ACID-NACL 1000-0.7 MG/100ML-% IV SOLN
1000.0000 mg | Freq: Once | INTRAVENOUS | Status: AC
  Administered 2020-03-07: 1000 mg via INTRAVENOUS
  Filled 2020-03-07: qty 100

## 2020-03-07 MED ORDER — MENTHOL 3 MG MT LOZG: 1.0000 | LOZENGE | OROMUCOSAL | Status: DC | PRN

## 2020-03-07 MED ORDER — BUPIVACAINE HCL (PF) 0.25 % IJ SOLN
INTRAMUSCULAR | Status: AC
  Filled 2020-03-07: qty 30

## 2020-03-07 MED ORDER — BUPIVACAINE HCL (PF) 0.5 % IJ SOLN
INTRAMUSCULAR | Status: DC | PRN
  Administered 2020-03-07: 20 mL via PERINEURAL

## 2020-03-07 MED ORDER — TRANEXAMIC ACID-NACL 1000-0.7 MG/100ML-% IV SOLN
1000.0000 mg | INTRAVENOUS | Status: AC
  Administered 2020-03-07: 1000 mg via INTRAVENOUS
  Filled 2020-03-07: qty 100

## 2020-03-07 MED ORDER — PROPOFOL 10 MG/ML IV BOLUS
INTRAVENOUS | Status: DC | PRN
  Administered 2020-03-07: 20 mg via INTRAVENOUS

## 2020-03-07 MED ORDER — DEXAMETHASONE SODIUM PHOSPHATE 10 MG/ML IJ SOLN
10.0000 mg | Freq: Once | INTRAMUSCULAR | Status: AC
  Administered 2020-03-08: 10 mg via INTRAVENOUS
  Filled 2020-03-07: qty 1

## 2020-03-07 MED ORDER — POLYETHYLENE GLYCOL 3350 17 G PO PACK
17.0000 g | PACK | Freq: Two times a day (BID) | ORAL | Status: DC
  Administered 2020-03-08: 17 g via ORAL
  Filled 2020-03-07 (×2): qty 1

## 2020-03-07 SURGICAL SUPPLY — 64 items
ADH SKN CLS APL DERMABOND .7 (GAUZE/BANDAGES/DRESSINGS) ×1
ATTUNE MED ANAT PAT 35 KNEE (Knees) ×1 IMPLANT
ATTUNE PSFEM RTSZ4 NARCEM KNEE (Femur) ×1 IMPLANT
ATTUNE PSRP INSR SZ4 6 KNEE (Insert) ×1 IMPLANT
ATTUNE PSRP INSR SZ4 8 KNEE (Insert) ×1 IMPLANT
BAG SPEC THK2 15X12 ZIP CLS (MISCELLANEOUS)
BAG ZIPLOCK 12X15 (MISCELLANEOUS) IMPLANT
BASEPLATE TIBIAL ROTATING SZ 4 (Knees) ×1 IMPLANT
BLADE SAW SGTL 11.0X1.19X90.0M (BLADE) ×1 IMPLANT
BLADE SAW SGTL 13.0X1.19X90.0M (BLADE) ×2 IMPLANT
BLADE SURG SZ10 CARB STEEL (BLADE) ×4 IMPLANT
BNDG ELASTIC 6X5.8 VLCR STR LF (GAUZE/BANDAGES/DRESSINGS) ×2 IMPLANT
BOWL SMART MIX CTS (DISPOSABLE) ×2 IMPLANT
BSPLAT TIB 4 CMNT ROT PLAT STR (Knees) ×1 IMPLANT
CEMENT HV SMART SET (Cement) ×2 IMPLANT
COVER SURGICAL LIGHT HANDLE (MISCELLANEOUS) ×2 IMPLANT
COVER WAND RF STERILE (DRAPES) ×1 IMPLANT
CUFF TOURN SGL QUICK 34 (TOURNIQUET CUFF) ×2
CUFF TRNQT CYL 34X4.125X (TOURNIQUET CUFF) ×1 IMPLANT
DECANTER SPIKE VIAL GLASS SM (MISCELLANEOUS) ×4 IMPLANT
DERMABOND ADVANCED (GAUZE/BANDAGES/DRESSINGS) ×1
DERMABOND ADVANCED .7 DNX12 (GAUZE/BANDAGES/DRESSINGS) ×1 IMPLANT
DRAPE U-SHAPE 47X51 STRL (DRAPES) ×2 IMPLANT
DRESSING AQUACEL AG SP 3.5X10 (GAUZE/BANDAGES/DRESSINGS) ×1 IMPLANT
DRSG AQUACEL AG SP 3.5X10 (GAUZE/BANDAGES/DRESSINGS) ×2
DURAPREP 26ML APPLICATOR (WOUND CARE) ×4 IMPLANT
ELECT REM PT RETURN 15FT ADLT (MISCELLANEOUS) ×2 IMPLANT
GLOVE BIO SURGEON STRL SZ 6 (GLOVE) ×2 IMPLANT
GLOVE BIOGEL PI IND STRL 6.5 (GLOVE) ×1 IMPLANT
GLOVE BIOGEL PI IND STRL 7.5 (GLOVE) ×1 IMPLANT
GLOVE BIOGEL PI IND STRL 8.5 (GLOVE) ×1 IMPLANT
GLOVE BIOGEL PI INDICATOR 6.5 (GLOVE) ×1
GLOVE BIOGEL PI INDICATOR 7.5 (GLOVE) ×1
GLOVE BIOGEL PI INDICATOR 8.5 (GLOVE) ×1
GLOVE ECLIPSE 8.0 STRL XLNG CF (GLOVE) ×2 IMPLANT
GLOVE ORTHO TXT STRL SZ7.5 (GLOVE) ×2 IMPLANT
GOWN STRL REUS W/ TWL LRG LVL3 (GOWN DISPOSABLE) ×1 IMPLANT
GOWN STRL REUS W/TWL 2XL LVL3 (GOWN DISPOSABLE) ×2 IMPLANT
GOWN STRL REUS W/TWL LRG LVL3 (GOWN DISPOSABLE) ×4 IMPLANT
HANDPIECE INTERPULSE COAX TIP (DISPOSABLE) ×2
HOLDER FOLEY CATH W/STRAP (MISCELLANEOUS) ×1 IMPLANT
KIT TURNOVER KIT A (KITS) IMPLANT
MANIFOLD NEPTUNE II (INSTRUMENTS) ×2 IMPLANT
NDL SAFETY ECLIPSE 18X1.5 (NEEDLE) IMPLANT
NEEDLE HYPO 18GX1.5 SHARP (NEEDLE)
NS IRRIG 1000ML POUR BTL (IV SOLUTION) ×2 IMPLANT
PACK TOTAL KNEE CUSTOM (KITS) ×2 IMPLANT
PENCIL SMOKE EVACUATOR (MISCELLANEOUS) ×1 IMPLANT
PIN DRILL FIX HALF THREAD (BIT) ×1 IMPLANT
PIN FIX SIGMA LCS THRD HI (PIN) ×1 IMPLANT
PROTECTOR NERVE ULNAR (MISCELLANEOUS) ×2 IMPLANT
SET HNDPC FAN SPRY TIP SCT (DISPOSABLE) ×1 IMPLANT
SET PAD KNEE POSITIONER (MISCELLANEOUS) ×2 IMPLANT
SUT MNCRL AB 4-0 PS2 18 (SUTURE) ×2 IMPLANT
SUT STRATAFIX PDS+ 0 24IN (SUTURE) ×2 IMPLANT
SUT VIC AB 1 CT1 36 (SUTURE) ×2 IMPLANT
SUT VIC AB 2-0 CT1 27 (SUTURE) ×6
SUT VIC AB 2-0 CT1 TAPERPNT 27 (SUTURE) ×3 IMPLANT
SYR 3ML LL SCALE MARK (SYRINGE) ×2 IMPLANT
TRAY FOLEY MTR SLVR 14FR STAT (SET/KITS/TRAYS/PACK) ×1 IMPLANT
TRAY FOLEY MTR SLVR 16FR STAT (SET/KITS/TRAYS/PACK) ×1 IMPLANT
WATER STERILE IRR 1000ML POUR (IV SOLUTION) ×4 IMPLANT
WRAP KNEE MAXI GEL POST OP (GAUZE/BANDAGES/DRESSINGS) ×2 IMPLANT
YANKAUER SUCT BULB TIP 10FT TU (MISCELLANEOUS) ×2 IMPLANT

## 2020-03-07 NOTE — Anesthesia Procedure Notes (Signed)
Anesthesia Procedure Image    

## 2020-03-07 NOTE — Anesthesia Procedure Notes (Signed)
Procedure Name: MAC Date/Time: 03/07/2020 9:50 AM Performed by: Lissa Morales, CRNA Pre-anesthesia Checklist: Patient identified, Emergency Drugs available, Suction available, Patient being monitored and Timeout performed Patient Re-evaluated:Patient Re-evaluated prior to induction Oxygen Delivery Method: Simple face mask Placement Confirmation: positive ETCO2

## 2020-03-07 NOTE — Anesthesia Postprocedure Evaluation (Signed)
Anesthesia Post Note  Patient: [name]  Procedure(s) Performed: TOTAL KNEE ARTHROPLASTY (Right Knee)     Patient location during evaluation: PACU Anesthesia Type: Spinal Level of consciousness: oriented and awake and alert Pain management: pain level controlled Vital Signs Assessment: post-procedure vital signs reviewed and stable Respiratory status: spontaneous breathing, respiratory function stable and patient connected to nasal cannula oxygen Cardiovascular status: blood pressure returned to baseline and stable Postop Assessment: no headache, no backache and no apparent nausea or vomiting Anesthetic complications: no    Last Vitals:  Vitals:   03/07/20 1145 03/07/20 1200  BP: 115/76 127/72  Pulse: 79 61  Resp: 14 (!) 27  Temp:    SpO2: 100% 99%    Last Pain:  Vitals:   03/07/20 1200  TempSrc:   PainSc: Asleep                 [name] S

## 2020-03-07 NOTE — Transfer of Care (Signed)
Immediate Anesthesia Transfer of Care Note  Patient: [name]  Procedure(s) Performed: TOTAL KNEE ARTHROPLASTY (Right Knee)  Patient Location: PACU  Anesthesia Type:Spinal  Level of Consciousness: awake, alert , oriented and patient cooperative  Airway & Oxygen Therapy: Patient Spontanous Breathing and Patient connected to face mask oxygen  Post-op Assessment: Report given to RN and Post -op Vital signs reviewed and stable  Post vital signs: stable  Last Vitals:  Vitals Value Taken Time  BP 117/73 03/07/20 1136  Temp    Pulse 78 03/07/20 1140  Resp 15 03/07/20 1140  SpO2 100 % 03/07/20 1140  Vitals shown include unvalidated device data.  Last Pain:  Vitals:   03/07/20 1136  TempSrc:   PainSc: (P) 0-No pain      Patients Stated Pain Goal: 4 ([id number])  Complications: No apparent anesthesia complications

## 2020-03-07 NOTE — Anesthesia Preprocedure Evaluation (Signed)
Anesthesia Evaluation  Patient identified by MRN, date of birth, ID band Patient awake    Reviewed: Allergy & Precautions, NPO status , Patient's Chart, lab work & pertinent test results  History of Anesthesia Complications (+) PONV  Airway Mallampati: II  TM Distance: >3 FB Neck ROM: Full    Dental no notable dental hx.    Pulmonary neg pulmonary ROS,    Pulmonary exam normal breath sounds clear to auscultation       Cardiovascular hypertension, Normal cardiovascular exam Rhythm:Regular Rate:Normal     Neuro/Psych negative neurological ROS  negative psych ROS   GI/Hepatic negative GI ROS, Neg liver ROS,   Endo/Other  negative endocrine ROS  Renal/GU negative Renal ROS  negative genitourinary   Musculoskeletal  (+) Arthritis , Osteoarthritis,    Abdominal   Peds negative pediatric ROS (+)  Hematology negative hematology ROS (+)   Anesthesia Other Findings   Reproductive/Obstetrics negative OB ROS                             Anesthesia Physical Anesthesia Plan  ASA: II  Anesthesia Plan: Spinal   Post-op Pain Management:  Regional for Post-op pain   Induction: Intravenous  PONV Risk Score and Plan: 3 and Ondansetron and Dexamethasone  Airway Management Planned: Simple Face Mask  Additional Equipment:   Intra-op Plan:   Post-operative Plan:   Informed Consent: I have reviewed the patients History and Physical, chart, labs and discussed the procedure including the risks, benefits and alternatives for the proposed anesthesia with the patient or authorized representative who has indicated his/her understanding and acceptance.     Dental advisory given  Plan Discussed with: CRNA and Surgeon  Anesthesia Plan Comments:         Anesthesia Quick Evaluation

## 2020-03-07 NOTE — Anesthesia Procedure Notes (Signed)
Spinal  Patient location during procedure: OR Start time: 03/07/2020 9:52 AM End time: 03/07/2020 9:58 AM Staffing Anesthesiologist: Myrtie Soman, MD Preanesthetic Checklist Completed: patient identified, IV checked, site marked, risks and benefits discussed, surgical consent, monitors and equipment checked, pre-op evaluation and timeout performed Spinal Block Patient position: sitting Prep: DuraPrep Patient monitoring: heart rate, cardiac monitor, continuous pulse ox and blood pressure Approach: midline Location: L3-4 Injection technique: single-shot Needle Needle type: Sprotte  Needle gauge: 24 G Needle length: 9 cm Assessment Sensory level: T6

## 2020-03-07 NOTE — Discharge Instructions (Signed)

## 2020-03-07 NOTE — Evaluation (Signed)
Physical Therapy Evaluation Patient Details Name: [name] MRN: [id number] DOB: 1946/01/29 Today's Date: 03/07/2020   History of Present Illness  s/p R TKA  Clinical Impression  Pt is s/p TKA resulting in the deficits listed below (see PT Problem List).  Pt amb 60' with RW and min assist. Anticipate steady progress in acute setting.   Pt will benefit from skilled PT to increase their independence and safety with mobility to allow discharge to the venue listed below.      Follow Up Recommendations Follow surgeon's recommendation for DC plan and follow-up therapies    Equipment Recommendations  Rolling walker with 5" wheels;3in1 (PT)    Recommendations for Other Services       Precautions / Restrictions Precautions Precautions: Knee;Fall Restrictions Weight Bearing Restrictions: No Other Position/Activity Restrictions: WBAT      Mobility  Bed Mobility Overal bed mobility: Needs Assistance Bed Mobility: Supine to Sit     Supine to sit: Supervision     General bed mobility comments: for lines and safety  Transfers Overall transfer level: Needs assistance Equipment used: Rolling walker (2 wheeled) Transfers: Sit to/from Stand Sit to Stand: Min assist;Min guard         General transfer comment: cues for hand placement  Ambulation/Gait Ambulation/Gait assistance: Min guard;Min assist Gait Distance (Feet): 60 Feet Assistive device: Rolling walker (2 wheeled) Gait Pattern/deviations: Step-to pattern;Decreased stance time - right     General Gait Details: cues for sequence and RW position  Stairs            Wheelchair Mobility    Modified Rankin (Stroke Patients Only)       Balance                                             Pertinent Vitals/Pain Pain Assessment: 0-10 Pain Score: 4  Pain Location: right knee Pain Descriptors / Indicators: Grimacing;Discomfort Pain Intervention(s): Premedicated before session;Monitored  during session;Limited activity within patient's tolerance;Repositioned    Home Living Family/patient expects to be discharged to:: Private residence Living Arrangements: Spouse/significant other   Type of Home: House Home Access: Stairs to enter   Secretary/administrator of Steps: 5 Home Layout: Two level;Bed/bath upstairs Home Equipment: Cane - single point      Prior Function Level of Independence: Independent         Comments: amb with cane for 1-2 wks prior to surgery     Hand Dominance        Extremity/Trunk Assessment   Upper Extremity Assessment Upper Extremity Assessment: Overall WFL for tasks assessed    Lower Extremity Assessment Lower Extremity Assessment: RLE deficits/detail RLE Deficits / Details: ankel WFL; knee and hip grossly 3+/5. AAROM knee flexion ~10 to 70 degrees       Communication   Communication: No difficulties  Cognition Arousal/Alertness: Awake/alert Behavior During Therapy: WFL for tasks assessed/performed Overall Cognitive Status: Within Functional Limits for tasks assessed                                        General Comments      Exercises Total Joint Exercises Ankle Circles/Pumps: AROM;10 reps;Both Quad Sets: AROM;Both;5 reps   Assessment/Plan    PT Assessment Patient needs continued PT services  PT Problem List Decreased  strength;Decreased activity tolerance;Decreased mobility;Pain;Decreased knowledge of use of DME;Decreased range of motion       PT Treatment Interventions DME instruction;Therapeutic exercise;Gait training;Functional mobility training;Therapeutic activities;Patient/family education;Stair training    PT Goals (Current goals can be found in the Care Plan section)  Acute Rehab PT Goals Patient Stated Goal: home PT Goal Formulation: With patient Time For Goal Achievement: 03/14/20 Potential to Achieve Goals: Good    Frequency 7X/week   Barriers to discharge         Co-evaluation               AM-PAC PT "6 Clicks" Mobility  Outcome Measure Help needed turning from your back to your side while in a flat bed without using bedrails?: A Little Help needed moving from lying on your back to sitting on the side of a flat bed without using bedrails?: A Little Help needed moving to and from a bed to a chair (including a wheelchair)?: A Little Help needed standing up from a chair using your arms (e.g., wheelchair or bedside chair)?: A Little Help needed to walk in hospital room?: A Little Help needed climbing 3-5 steps with a railing? : A Lot 6 Click Score: 17    End of Session Equipment Utilized During Treatment: Gait belt Activity Tolerance: Patient tolerated treatment well Patient left: in chair;with call bell/phone within reach;with chair alarm set   PT Visit Diagnosis: Difficulty in walking, not elsewhere classified (R26.2)    Time: 6578-4696 PT Time Calculation (min) (ACUTE ONLY): 16 min   Charges:   PT Evaluation $PT Eval Low Complexity: 1 Low           Raelynn Corron, PT   Acute Rehab Dept [facility]): [phone]   03/07/2020   [facility] 03/07/2020, 5:04 PM

## 2020-03-07 NOTE — Anesthesia Procedure Notes (Signed)
Anesthesia Regional Block: Adductor canal block   Pre-Anesthetic Checklist: ,, timeout performed, Correct Patient, Correct Site, Correct Laterality, Correct Procedure, Correct Position, site marked, Risks and benefits discussed,  Surgical consent,  Pre-op evaluation,  At surgeon's request and post-op pain management  Laterality: Right  Prep: chloraprep       Needles:  Injection technique: Single-shot  Needle Type: Echogenic Needle     Needle Length: 9cm      Additional Needles:   Procedures:,,,, ultrasound used (permanent image in chart),,,,  Narrative:  Start time: 03/07/2020 9:20 AM End time: 03/07/2020 9:26 AM Injection made incrementally with aspirations every 5 mL.  Performed by: Personally  Anesthesiologist: Myrtie Soman, MD  Additional Notes: Patient tolerated the procedure well without complications

## 2020-03-07 NOTE — Progress Notes (Signed)
Assisted Dr.George Rose with  Right Knee Adductor Canal block. Side rails up, monitors on throughout procedure. See vital signs in flow sheet. Tolerated Procedure well.  

## 2020-03-07 NOTE — Plan of Care (Signed)
Plan of care 

## 2020-03-07 NOTE — Care Plan (Signed)
Ortho Bundle Case Management Note  Patient Details  Name: [name] MRN: [id number] Date of Birth: 29-Jun-1946  R TKA on 03-07-20 DCP:  Home with spouse. 2 story home with 5 ste. DME:  RW and 3-in-1 ordered through [facility] PT:  [facility].  PT eval scheduled on 03-10-20.                   DME Arranged:  Walker rolling, 3-N-1 DME Agency:  [employer]  HH Arranged:  NA HH Agency:  NA  Additional Comments: Please contact me with any questions of if this plan should need to change.  Marianne Sofia, RN,CCM [facility]  [phone] 03/07/2020, 4:46 PM

## 2020-03-07 NOTE — Op Note (Signed)
NAME:  [name]                      MEDICAL RECORD NO.:  [id number]                             FACILITY:  [facility]      PHYSICIAN:  Pietro Cassis. Alvan Dame, M.D.  DATE OF BIRTH:  19-Mar-1946      DATE OF PROCEDURE:  03/07/2020                                     OPERATIVE REPORT         PREOPERATIVE DIAGNOSIS:  Right knee osteoarthritis.      POSTOPERATIVE DIAGNOSIS:  Right knee osteoarthritis.      FINDINGS:  The patient was noted to have complete loss of cartilage and   bone-on-bone arthritis with associated osteophytes in the lateral and patellofemoral compartments of   the knee with a significant synovitis and associated effusion.  The patient had failed months of conservative treatment including medications, injection therapy, activity modification.     PROCEDURE:  Right total knee replacement.      COMPONENTS USED:  DePuy Attune rotating platform posterior stabilized knee   system, a size 4N femur, 4 tibia, size 6 mm PS AOX insert, and 35 anatomic patellar   button.      SURGEON:  Pietro Cassis. Alvan Dame, M.D.      ASSISTANT:  Griffith Citron, PA-C.      ANESTHESIA:  Regional and Spinal.      SPECIMENS:  None.      COMPLICATION:  None.      DRAINS:  None.  EBL: <100cc      TOURNIQUET TIME:   Total Tourniquet Time Documented: Thigh (Right) - 30 minutes Total: Thigh (Right) - 30 minutes       The patient was stable to the recovery room.      INDICATION FOR PROCEDURE:  [name] is a 74 y.o. female patient of   mine.  The patient had been seen, evaluated, and treated for months conservatively in the   office with medication, activity modification, and injections.  The patient had   radiographic changes of bone-on-bone arthritis with endplate sclerosis and osteophytes noted.  Based on the radiographic changes and failed conservative measures, the patient   decided to proceed with definitive treatment, total knee replacement.  Risks of infection, DVT, component failure, need  for revision surgery, neurovascular injury were reviewed in the office setting.  The postop course was reviewed stressing the efforts to maximize post-operative satisfaction and function.  Consent was obtained for benefit of pain   relief.      PROCEDURE IN DETAIL:  The patient was brought to the operative theater.   Once adequate anesthesia, preoperative antibiotics, 2 gm of Ancef,1 gm of Tranexamic Acid, and 10 mg of Decadron administered, the patient was positioned supine with a right thigh tourniquet placed.  The  right lower extremity was prepped and draped in sterile fashion.  A time-   out was performed identifying the patient, planned procedure, and the appropriate extremity.      The right lower extremity was placed in the [facility] leg holder.  The leg was   exsanguinated, tourniquet elevated to 250 mmHg.  A midline incision was   made followed  by median parapatellar arthrotomy.  Following initial   exposure, attention was first directed to the patella.  Precut   measurement was noted to be 22 mm.  I resected down to 13 mm and used a   35 anatomic patellar button to restore patellar height as well as cover the cut surface.      The lug holes were drilled and a metal shim was placed to protect the   patella from retractors and saw blade during the procedure.      At this point, attention was now directed to the femur.  The femoral   canal was opened with a drill, irrigated to try to prevent fat emboli.  An   intramedullary rod was passed at 3 degrees valgus, 9 mm of bone was   resected off the distal femur.  Following this resection, the tibia was   subluxated anteriorly.  Using the extramedullary guide, 2 mm of bone was resected off   the proximal lateral tibia.  We confirmed the gap would be   stable medially and laterally with a size 5 spacer block as well as confirmed that the tibial cut was perpendicular in the coronal plane, checking with an alignment rod.      Once this was  done, I sized the femur to be a size 4 in the anterior-   posterior dimension, chose a narrow component based on medial and   lateral dimension.  The size 4 rotation block was then pinned in   position anterior referenced using the C-clamp to set rotation.  The   anterior, posterior, and  chamfer cuts were made without difficulty nor   notching making certain that I was along the anterior cortex to help   with flexion gap stability.      The final box cut was made off the lateral aspect of distal femur.      At this point, the tibia was sized to be a size 4.  The size 4 tray was   then pinned in position through the medial third of the tubercle,   drilled, and keel punched.  Trial reduction was now carried with a 4 femur,  4 tibia, a size 6 mm PS insert, and the 35 anatomic patella botton.  The knee was brought to full extension with good flexion stability with the patella   tracking through the trochlea without application of pressure.  Given   all these findings the trial components removed.  Final components were   opened and cement was mixed.  The knee was irrigated with normal saline solution and pulse lavage.  The synovial lining was   then injected with 30 cc of 0.25% Marcaine with epinephrine, 1 cc of Toradol and 30 cc of NS for a total of 61 cc.     Final implants were then cemented onto cleaned and dried cut surfaces of bone with the knee brought to extension with a size 6 mm PS trial insert.      Once the cement had fully cured, excess cement was removed   throughout the knee.  I confirmed that I was satisfied with the range of   motion and stability, and the final size 6 mm PS AOX insert was chosen.  It was   placed into the knee.      The tourniquet had been let down at 30 minutes.  No significant   hemostasis was required.  The extensor mechanism was then reapproximated using #1 Vicryl and #1  Stratafix sutures with the knee   in flexion.  The   remaining wound was closed  with 2-0 Vicryl and running 4-0 Monocryl.   The knee was cleaned, dried, dressed sterilely using Dermabond and   Aquacel dressing.  The patient was then   brought to recovery room in stable condition, tolerating the procedure   well.   Please note that Physician Assistant, Griffith Citron, PA-C was present for the entirety of the case, and was utilized for pre-operative positioning, peri-operative retractor management, general facilitation of the procedure and for primary wound closure at the end of the case.              Pietro Cassis Alvan Dame, M.D.    03/07/2020 11:12 AM

## 2020-03-07 NOTE — Interval H&P Note (Signed)
History and Physical Interval Note:  03/07/2020 8:44 AM  [name]  has presented today for surgery, with the diagnosis of Right knee osteoarthritis.  The various methods of treatment have been discussed with the patient and family. After consideration of risks, benefits and other options for treatment, the patient has consented to  Procedure(s) with comments: TOTAL KNEE ARTHROPLASTY (Right) - 70 mins as a surgical intervention.  The patient's history has been reviewed, patient examined, no change in status, stable for surgery.  I have reviewed the patient's chart and labs.  Questions were answered to the patient's satisfaction.     Mauri Pole

## 2020-03-08 ENCOUNTER — Encounter: Payer: Self-pay | Admitting: *Deleted

## 2020-03-08 DIAGNOSIS — Z96651 Presence of right artificial knee joint: Secondary | ICD-10-CM | POA: Diagnosis not present

## 2020-03-08 DIAGNOSIS — M1711 Unilateral primary osteoarthritis, right knee: Secondary | ICD-10-CM | POA: Diagnosis not present

## 2020-03-08 DIAGNOSIS — E663 Overweight: Secondary | ICD-10-CM | POA: Diagnosis present

## 2020-03-08 LAB — BASIC METABOLIC PANEL
Anion gap: 7 (ref 5–15)
BUN: 21 mg/dL (ref 8–23)
CO2: 24 mmol/L (ref 22–32)
Calcium: 8.9 mg/dL (ref 8.9–10.3)
Chloride: 106 mmol/L (ref 98–111)
Creatinine, Ser: 0.95 mg/dL (ref 0.44–1.00)
GFR calc Af Amer: >60 mL/min (ref >60)
GFR calc non Af Amer: 59 mL/min — ABNORMAL LOW (ref >60)
Glucose, Bld: 129 mg/dL — ABNORMAL HIGH (ref 70–99)
Potassium: 4.9 mmol/L (ref 3.5–5.1)
Sodium: 137 mmol/L (ref 135–145)

## 2020-03-08 LAB — CBC
HCT: 33.4 % — ABNORMAL LOW (ref 36.0–46.0)
Hemoglobin: 10.8 g/dL — ABNORMAL LOW (ref 12.0–15.0)
MCH: 31.3 pg (ref 26.0–34.0)
MCHC: 32.3 g/dL (ref 30.0–36.0)
MCV: 96.8 fL (ref 80.0–100.0)
Platelets: 197 10*3/uL (ref 150–400)
RBC: 3.45 MIL/uL — ABNORMAL LOW (ref 3.87–5.11)
RDW: 12.3 % (ref 11.5–15.5)
WBC: 11.6 10*3/uL — ABNORMAL HIGH (ref 4.0–10.5)
nRBC: 0 % (ref 0.0–0.2)

## 2020-03-08 MED ORDER — DOCUSATE SODIUM 100 MG PO CAPS
100.0000 mg | ORAL_CAPSULE | Freq: Two times a day (BID) | ORAL | 0 refills | Status: DC
Start: 2020-03-08 — End: 2020-07-07

## 2020-03-08 MED ORDER — HYDROCODONE-ACETAMINOPHEN 5-325 MG PO TABS: 1.0000 | ORAL_TABLET | ORAL | 0 refills | Status: DC | PRN

## 2020-03-08 MED ORDER — ASPIRIN 81 MG PO CHEW: 81.0000 mg | CHEWABLE_TABLET | Freq: Two times a day (BID) | ORAL | 0 refills | Status: AC

## 2020-03-08 MED ORDER — POLYETHYLENE GLYCOL 3350 17 G PO PACK
17.0000 g | PACK | Freq: Two times a day (BID) | ORAL | 0 refills | Status: DC
Start: 2020-03-08 — End: 2020-07-07

## 2020-03-08 MED ORDER — FERROUS SULFATE 325 (65 FE) MG PO TABS: 325.0000 mg | ORAL_TABLET | Freq: Three times a day (TID) | ORAL | 0 refills | Status: DC

## 2020-03-08 MED ORDER — METHOCARBAMOL 500 MG PO TABS: 500.0000 mg | ORAL_TABLET | Freq: Four times a day (QID) | ORAL | 0 refills | Status: DC | PRN

## 2020-03-08 MED ORDER — CELECOXIB 200 MG PO CAPS
200.0000 mg | ORAL_CAPSULE | Freq: Two times a day (BID) | ORAL | 0 refills | Status: DC
Start: 2020-03-08 — End: 2020-07-07

## 2020-03-08 NOTE — Progress Notes (Signed)
     Subjective: 1 Day Post-Op Procedure(s) (LRB): TOTAL KNEE ARTHROPLASTY (Right)   Patient reports pain as mild, pain controlled with medication.  No reported events throughout the night.  Patient worked well with therapy yesterday walking about 60 feet.  Patient is ready be discharged home, if he does well therapy today.  Patient follow-up in the clinic in 2 weeks.  Patient knows to call with any questions or concerns.   Patient's anticipated LOS is less than 2 midnights, meeting these requirements: - Lives within 1 hour of care - Has a competent adult at home to recover with post-op recover - NO history of  - Chronic pain requiring opiods  - Diabetes  - Coronary Artery Disease  - Heart failure  - Heart attack  - Stroke  - DVT/VTE  - Cardiac arrhythmia  - Respiratory Failure/COPD  - Renal failure  - Anemia  - Advanced Liver disease       Objective:   VITALS:   Vitals:   03/08/20 0143 03/08/20 0448  BP: 104/69 126/76  Pulse: 76 72  Resp: 16 18  Temp: 98.6 F (37 C) 98.1 F (36.7 C)  SpO2: 94% 96%    Dorsiflexion/Plantar flexion intact Incision: dressing C/D/I No cellulitis present Compartment soft  LABS Recent Labs    03/08/20 0248  HGB 10.8*  HCT 33.4*  WBC 11.6*  PLT 197    Recent Labs    03/08/20 0248  NA 137  K 4.9  BUN 21  CREATININE 0.95  GLUCOSE 129*     Assessment/Plan: 1 Day Post-Op Procedure(s) (LRB): TOTAL KNEE ARTHROPLASTY (Right) Foley cath d/c'ed Advance diet Up with therapy D/C IV fluids Discharge home Follow up in 2 weeks at [facility] Follow up with OLIN,Dodie Parisi D in 2 weeks.  Contact information:  [facility] [address], [address] [id number]    Overweight (BMI 25-29.9) Estimated body mass index is 29.72 kg/m as calculated from the following:   Height as of this encounter: 5\' 5"  (1.651 m).   Weight as of this encounter: 81 kg. Patient also counseled that weight  may inhibit the healing process Patient counseled that losing weight will help with future health issues       Danae Orleans PA-C  [facility]  [place] [address], [address], [place], [place] [address] Phone: [phone] [email or web address] Facebook  [employer]

## 2020-03-08 NOTE — Progress Notes (Signed)
   03/08/20 1212  PT Visit Information  Last PT Received On 03/08/20  Pt progressing very well.  Reviewed stairs with pts and husband. Ready for d/c from PT standpoint  Assistance Needed +1  History of Present Illness s/p R TKA  Subjective Data  Patient Stated Goal home  Precautions  Precautions Knee;Fall  Restrictions  Weight Bearing Restrictions No  Other Position/Activity Restrictions WBAT  Pain Assessment  Pain Assessment 0-10  Pain Score 3  Pain Location right knee  Pain Descriptors / Indicators Grimacing;Discomfort  Pain Intervention(s) Limited activity within patient's tolerance;Monitored during session  Cognition  Arousal/Alertness Awake/alert  Behavior During Therapy WFL for tasks assessed/performed  Overall Cognitive Status Within Functional Limits for tasks assessed  Bed Mobility  Overal bed mobility Modified Independent  Transfers  Overall transfer level Needs assistance  Equipment used Rolling walker (2 wheeled)  Transfers Sit to/from Stand  Sit to Stand Supervision;Min guard  General transfer comment cues for hand placement  Ambulation/Gait  Ambulation/Gait assistance Supervision  Gait Distance (Feet) 200 Feet  Assistive device Rolling walker (2 wheeled)  Gait Pattern/deviations Step-to pattern;Decreased stance time - right  General Gait Details cues for sequence and RW position. encouraged knee extension in standing  Stairs Yes  Stairs assistance Min guard  Stair Management One rail Right;One rail Left;Step to pattern;Sideways;Forwards;Two rails  Number of Stairs 5 (x2)  General stair comments cues for sequence and safety. up/down steps with 2 rails, up/down with one rail sideways. husband present for session  PT - End of Session  Equipment Utilized During Treatment Gait belt  Activity Tolerance Patient tolerated treatment well  Patient left with call bell/phone within reach;in bed;with family/visitor present   PT - Assessment/Plan  PT Plan Current plan  remains appropriate  PT Visit Diagnosis Difficulty in walking, not elsewhere classified (R26.2)  PT Frequency (ACUTE ONLY) 7X/week  Follow Up Recommendations Follow surgeon's recommendation for DC plan and follow-up therapies  PT equipment Rolling walker with 5" wheels;3in1 (PT)  AM-PAC PT "6 Clicks" Mobility Outcome Measure (Version 2)  Help needed turning from your back to your side while in a flat bed without using bedrails? 3  Help needed moving from lying on your back to sitting on the side of a flat bed without using bedrails? 3  Help needed moving to and from a bed to a chair (including a wheelchair)? 3  Help needed standing up from a chair using your arms (e.g., wheelchair or bedside chair)? 3  Help needed to walk in hospital room? 3  Help needed climbing 3-5 steps with a railing?  3  6 Click Score 18  Consider Recommendation of Discharge To: Home with [facility]  PT Goal Progression  Progress towards PT goals Progressing toward goals  Acute Rehab PT Goals  PT Goal Formulation With patient  Time For Goal Achievement 03/14/20  Potential to Achieve Goals Good  PT Time Calculation  PT Start Time (ACUTE ONLY) 1159  PT Stop Time (ACUTE ONLY) 1210  PT Time Calculation (min) (ACUTE ONLY) 11 min  PT General Charges  $$ ACUTE PT VISIT 1 Visit  PT Treatments  $Gait Training 8-22 mins

## 2020-03-08 NOTE — Progress Notes (Signed)
Physical Therapy Treatment Patient Details Name: [name] MRN: [id number] DOB: [birth year] Today's Date: 03/08/2020    History of Present Illness s/p R TKA    PT Comments    Pt progressing well. Will see again for stair review and pt should be ready for d/c later today    Follow Up Recommendations  Follow surgeon's recommendation for DC plan and follow-up therapies     Equipment Recommendations  Rolling walker with 5" wheels;3in1 (PT)    Recommendations for Other Services       Precautions / Restrictions Precautions Precautions: Knee;Fall Restrictions Weight Bearing Restrictions: No Other Position/Activity Restrictions: WBAT    Mobility  Bed Mobility Overal bed mobility: Needs Assistance Bed Mobility: Supine to Sit     Supine to sit: Supervision     General bed mobility comments: for safety  Transfers Overall transfer level: Needs assistance Equipment used: Rolling walker (2 wheeled) Transfers: Sit to/from Stand Sit to Stand: Supervision;Min guard         General transfer comment: cues for hand placement  Ambulation/Gait Ambulation/Gait assistance: Supervision;Min guard Gait Distance (Feet): 120 Feet Assistive device: Rolling walker (2 wheeled) Gait Pattern/deviations: Step-to pattern;Decreased stance time - right     General Gait Details: cues for sequence and RW position   Stairs             Wheelchair Mobility    Modified Rankin (Stroke Patients Only)       Balance                                            Cognition Arousal/Alertness: Awake/alert Behavior During Therapy: WFL for tasks assessed/performed Overall Cognitive Status: Within Functional Limits for tasks assessed                                        Exercises Total Joint Exercises Ankle Circles/Pumps: AROM;10 reps;Both Quad Sets: AROM;Both;10 reps Short Arc Quad: AROM;Right;10 reps Heel Slides: AROM;AAROM;Right;10 reps Hip  ABduction/ADduction: AROM;Right;10 reps Straight Leg Raises: AROM;Right;AAROM;10 reps Goniometric ROM: grossly ~8 to 75 degrees right knee flexion    General Comments        Pertinent Vitals/Pain Pain Assessment: 0-10 Pain Score: 3  Pain Location: right knee Pain Descriptors / Indicators: Grimacing;Discomfort Pain Intervention(s): Limited activity within patient's tolerance;Monitored during session;Premedicated before session;Repositioned    Home Living                      Prior Function            PT Goals (current goals can now be found in the care plan section) Acute Rehab PT Goals Patient Stated Goal: home PT Goal Formulation: With patient Time For Goal Achievement: 03/14/20 Potential to Achieve Goals: Good Progress towards PT goals: Progressing toward goals    Frequency    7X/week      PT Plan Current plan remains appropriate    Co-evaluation              AM-PAC PT "6 Clicks" Mobility   Outcome Measure  Help needed turning from your back to your side while in a flat bed without using bedrails?: A Little Help needed moving from lying on your back to sitting on the side of a flat bed without using  bedrails?: A Little Help needed moving to and from a bed to a chair (including a wheelchair)?: A Little Help needed standing up from a chair using your arms (e.g., wheelchair or bedside chair)?: A Little Help needed to walk in hospital room?: A Little Help needed climbing 3-5 steps with a railing? : A Little 6 Click Score: 18    End of Session Equipment Utilized During Treatment: Gait belt Activity Tolerance: Patient tolerated treatment well Patient left: with call bell/phone within reach;in bed;with family/visitor present   PT Visit Diagnosis: Difficulty in walking, not elsewhere classified (R26.2)     Time: 1610-9604 PT Time Calculation (min) (ACUTE ONLY): 19 min  Charges:  $Gait Training: 8-22 mins                     Ayden Hardwick,  PT   Acute Rehab Dept [facility]): [phone]   03/08/2020    [facility] 03/08/2020, 11:51 AM

## 2020-03-08 NOTE — Progress Notes (Signed)
Patient discharged to home w/ husband. Given all belongings, instructions, equipment. Verbalized understanding of all instructions. Escorted to pov via w/c. 

## 2020-03-08 NOTE — Discharge Summary (Signed)
Patient ID: [name] MRN: [id number] DOB/AGE: 74-13-1947 74 y.o.  Admit date: 03/07/2020 Discharge date: 03/08/2020  Admission Diagnoses:  Principal Problem:   S/P right TKA Active Problems:   Overweight (BMI 25.0-29.9)   Discharge Diagnoses:  Same  Past Medical History:  Diagnosis Date  . Arthritis   . Cancer ([place]) 01/07/2006   Breast cancer- L Breast  . Cataract   . Complication of anesthesia   . Hypertension   . Injury of ligament of hand    right thumb ulnar collateral ligament injury.  recommend MR arthrogram of the thumb.  Marland Kitchen Neuromuscular disorder ([place])    scoliosis s/p spine surgery, has arthritis and pinched nerves  . PONV (postoperative nausea and vomiting)     Surgeries: Procedure(s): TOTAL KNEE ARTHROPLASTY on 03/07/2020   Consultants:   Discharged Condition: Improved  Hospital Course: [name] is an 74 y.o. female who was admitted 03/07/2020 for operative treatment ofStatus post total right knee replacement. Patient has severe unremitting pain that affects sleep, daily activities, and work/hobbies. After pre-op clearance the patient was taken to the operating room on 03/07/2020 and underwent  Procedure(s): TOTAL KNEE ARTHROPLASTY.    Patient was given perioperative antibiotics:  Anti-infectives (From admission, onward)   Start     Dose/Rate Route Frequency Ordered Stop   03/07/20 1600  ceFAZolin (ANCEF) IVPB 2g/100 mL premix     2 g 200 mL/hr over 30 Minutes Intravenous Every 6 hours 03/07/20 1439 03/07/20 2237   03/07/20 0800  ceFAZolin (ANCEF) IVPB 2g/100 mL premix     2 g 200 mL/hr over 30 Minutes Intravenous On call to O.R. 03/07/20 [id number] 03/07/20 0954       Patient was given sequential compression devices, early ambulation, and chemoprophylaxis to prevent DVT.  Patient benefited maximally from hospital stay and there were no complications.    Recent vital signs:  Patient Vitals for the past 24 hrs:  BP Temp Temp src Pulse Resp SpO2  Height Weight  03/08/20 0448 126/76 98.1 F (36.7 C) Oral 72 18 96 % -- --  03/08/20 0143 104/69 98.6 F (37 C) Oral 76 16 94 % -- --  03/07/20 2037 129/69 98.2 F (36.8 C) Oral 81 18 94 % -- --  03/07/20 1755 115/73 97.9 F (36.6 C) -- 72 12 97 % -- --  03/07/20 1714 121/74 97.9 F (36.6 C) Oral 72 12 98 % -- --  03/07/20 1550 122/71 97.6 F (36.4 C) -- 83 16 97 % -- --  03/07/20 1436 124/75 (!) 97.5 F (36.4 C) Oral 75 16 98 % 5\' 5"  (1.651 m) 81 kg  03/07/20 1345 (!) 109/59 98 F (36.7 C) -- 67 16 95 % -- --  03/07/20 1330 110/66 -- -- 69 12 96 % -- --  03/07/20 1315 132/74 -- -- 70 13 98 % -- --  03/07/20 1300 117/79 -- -- 69 15 100 % -- --  03/07/20 1245 130/80 -- -- 78 19 100 % -- --  03/07/20 1230 134/74 -- -- 71 10 100 % -- --  03/07/20 1215 114/69 -- -- 62 18 99 % -- --  03/07/20 1200 127/72 -- -- 61 (!) 27 99 % -- --  03/07/20 1145 115/76 -- -- 79 14 100 % -- --  03/07/20 1136 117/73 97.7 F (36.5 C) -- 85 14 100 % -- --  03/07/20 0931 118/68 -- -- 66 (!) 9 99 % -- --  03/07/20 0926 112/73 -- -- 66  13 100 % -- --  03/07/20 0921 121/77 -- -- 70 14 100 % -- --     Recent laboratory studies:  Recent Labs    03/08/20 0248  WBC 11.6*  HGB 10.8*  HCT 33.4*  PLT 197  NA 137  K 4.9  CL 106  CO2 24  BUN 21  CREATININE 0.95  GLUCOSE 129*  CALCIUM 8.9     Discharge Medications:   Allergies as of 03/08/2020      Reactions   Albuterol Other (See Comments)   Heart race, tremble and studdering   Codeine Other (See Comments)   DIZZINESS   Pravachol [pravastatin Sodium] Other (See Comments)   Per patient caused headaches and GI upset      Medication List    STOP taking these medications   aspirin 81 MG tablet Replaced by: aspirin 81 MG chewable tablet   baclofen 10 MG tablet Commonly known as: LIORESAL   meloxicam 15 MG tablet Commonly known as: MOBIC   tiZANidine 4 MG tablet Commonly known as: ZANAFLEX     TAKE these medications   aspirin 81 MG  chewable tablet Commonly known as: Aspirin Childrens Chew 1 tablet (81 mg total) by mouth 2 (two) times daily. Take for 4 weeks, then resume regular dose. Start taking on: Mar 09, 2020 Replaces: aspirin 81 MG tablet   celecoxib 200 MG capsule Commonly known as: CeleBREX Take 1 capsule (200 mg total) by mouth 2 (two) times daily.   docusate sodium 100 MG capsule Commonly known as: Colace Take 1 capsule (100 mg total) by mouth 2 (two) times daily.   ferrous sulfate 325 (65 FE) MG tablet Commonly known as: FerrouSul Take 1 tablet (325 mg total) by mouth 3 (three) times daily with meals for 14 days.   Fish Oil 1000 MG Caps Take 1,000 mg by mouth daily.   HYDROcodone-acetaminophen 5-325 MG tablet Commonly known as: Norco Take 1-2 tablets by mouth every 4 (four) hours as needed for moderate pain or severe pain.   lisinopril 10 MG tablet Commonly known as: ZESTRIL Take 1 tablet (10 mg total) by mouth daily.   methocarbamol 500 MG tablet Commonly known as: Robaxin Take 1 tablet (500 mg total) by mouth every 6 (six) hours as needed for muscle spasms.   multivitamin tablet Take 1 tablet by mouth daily.   polyethylene glycol 17 g packet Commonly known as: MIRALAX / GLYCOLAX Take 17 g by mouth 2 (two) times daily.   SAM-e 200 MG Tabs Take 200 mg by mouth daily.   TURMERIC PO Take 1,500 mg by mouth daily.   Vitamin D 125 MCG (5000 UT) Caps Take 5,000 Units by mouth daily.            Discharge Care Instructions  (From admission, onward)         Start     Ordered   03/08/20 0000  Change dressing    Comments: Maintain surgical dressing until follow up in the clinic. If the edges start to pull up, may reinforce with tape. If the dressing is no longer working, may remove and cover with gauze and tape, but must keep the area dry and clean.  Call with any questions or concerns.   03/08/20 0835          Diagnostic Studies: No results found.  Disposition: Discharge  disposition: 01-Home or Self Care       Discharge Instructions    Call MD / Call 911  Complete by: As directed    If you experience chest pain or shortness of breath, CALL 911 and be transported to the hospital emergency room.  If you develope a fever above 101 F, pus (white drainage) or increased drainage or redness at the wound, or calf pain, call your surgeon's office.   Change dressing   Complete by: As directed    Maintain surgical dressing until follow up in the clinic. If the edges start to pull up, may reinforce with tape. If the dressing is no longer working, may remove and cover with gauze and tape, but must keep the area dry and clean.  Call with any questions or concerns.   Constipation Prevention   Complete by: As directed    Drink plenty of fluids.  Prune juice may be helpful.  You may use a stool softener, such as Colace (over the counter) 100 mg twice a day.  Use MiraLax (over the counter) for constipation as needed.   Diet - low sodium heart healthy   Complete by: As directed    Discharge instructions   Complete by: As directed    Maintain surgical dressing until follow up in the clinic. If the edges start to pull up, may reinforce with tape. If the dressing is no longer working, may remove and cover with gauze and tape, but must keep the area dry and clean.  Follow up in 2 weeks at [facility]. Call with any questions or concerns.   Increase activity slowly as tolerated   Complete by: As directed    Weight bearing as tolerated with assist device (walker, cane, etc) as directed, use it as long as suggested by your surgeon or therapist, typically at least 4-6 weeks.   TED hose   Complete by: As directed    Use stockings (TED hose) for 2 weeks on both leg(s).  You may remove them at night for sleeping.      Follow-up Information    Paralee Cancel, MD. Go on 03/22/2020.   Specialty: Orthopedic Surgery Why: You are scheduled for a post-operative appointment on 03-22-20 at  2:30 pn. Contact information: [address] [place] [address] [id number]        Rosilyn Mings.. Go on 03/10/2020.   Why: You are scheduled for a physical therapy appointment on 03-10-20 at 11:00 am.  Contact information: [place] [address] [phone]            Signed: Lucille Passy [facility] 03/08/2020, 8:54 AM

## 2020-03-08 NOTE — Plan of Care (Signed)
Plan of care reviewed and discussed with the patient. 

## 2020-03-08 NOTE — Progress Notes (Signed)
Met briefly with pt to confirm she has received her DME (rw and 3n1 commode) from [facility] and plans for OPPT at [facility].  Pt denies any needs - ready for d/c.  Alga Southall, LCSW

## 2020-03-10 DIAGNOSIS — M25561 Pain in right knee: Secondary | ICD-10-CM | POA: Diagnosis not present

## 2020-03-13 ENCOUNTER — Other Ambulatory Visit: Payer: Self-pay | Admitting: Family Medicine

## 2020-03-14 DIAGNOSIS — M25561 Pain in right knee: Secondary | ICD-10-CM | POA: Diagnosis not present

## 2020-03-17 DIAGNOSIS — M25561 Pain in right knee: Secondary | ICD-10-CM | POA: Diagnosis not present

## 2020-03-24 DIAGNOSIS — M25561 Pain in right knee: Secondary | ICD-10-CM | POA: Diagnosis not present

## 2020-03-27 DIAGNOSIS — M25561 Pain in right knee: Secondary | ICD-10-CM | POA: Diagnosis not present

## 2020-03-30 DIAGNOSIS — M25561 Pain in right knee: Secondary | ICD-10-CM | POA: Diagnosis not present

## 2020-04-04 DIAGNOSIS — M25561 Pain in right knee: Secondary | ICD-10-CM | POA: Diagnosis not present

## 2020-04-06 DIAGNOSIS — M25561 Pain in right knee: Secondary | ICD-10-CM | POA: Diagnosis not present

## 2020-04-11 DIAGNOSIS — M25561 Pain in right knee: Secondary | ICD-10-CM | POA: Diagnosis not present

## 2020-04-13 DIAGNOSIS — M25561 Pain in right knee: Secondary | ICD-10-CM | POA: Diagnosis not present

## 2020-04-18 DIAGNOSIS — M25561 Pain in right knee: Secondary | ICD-10-CM | POA: Diagnosis not present

## 2020-04-20 DIAGNOSIS — M25561 Pain in right knee: Secondary | ICD-10-CM | POA: Diagnosis not present

## 2020-04-21 DIAGNOSIS — Z96651 Presence of right artificial knee joint: Secondary | ICD-10-CM | POA: Diagnosis not present

## 2020-04-21 DIAGNOSIS — Z471 Aftercare following joint replacement surgery: Secondary | ICD-10-CM | POA: Diagnosis not present

## 2020-05-18 ENCOUNTER — Other Ambulatory Visit: Payer: Self-pay

## 2020-05-19 ENCOUNTER — Ambulatory Visit: Payer: Medicare HMO | Admitting: Family Medicine

## 2020-06-30 ENCOUNTER — Ambulatory Visit: Payer: Medicare HMO | Admitting: Family Medicine

## 2020-07-03 ENCOUNTER — Other Ambulatory Visit: Payer: Self-pay

## 2020-07-04 ENCOUNTER — Ambulatory Visit: Payer: Medicare HMO | Admitting: Family Medicine

## 2020-07-07 ENCOUNTER — Ambulatory Visit (INDEPENDENT_AMBULATORY_CARE_PROVIDER_SITE_OTHER): Payer: Medicare HMO | Admitting: Family Medicine

## 2020-07-07 ENCOUNTER — Encounter: Payer: Self-pay | Admitting: Family Medicine

## 2020-07-07 VITALS — BP 144/90 | HR 87 | Temp 97.5°F | Ht 65.5 in | Wt 169.8 lb

## 2020-07-07 DIAGNOSIS — Z13820 Encounter for screening for osteoporosis: Secondary | ICD-10-CM

## 2020-07-07 DIAGNOSIS — Z853 Personal history of malignant neoplasm of breast: Secondary | ICD-10-CM | POA: Diagnosis not present

## 2020-07-07 DIAGNOSIS — Z23 Encounter for immunization: Secondary | ICD-10-CM | POA: Diagnosis not present

## 2020-07-07 DIAGNOSIS — E782 Mixed hyperlipidemia: Secondary | ICD-10-CM

## 2020-07-07 DIAGNOSIS — M5416 Radiculopathy, lumbar region: Secondary | ICD-10-CM

## 2020-07-07 DIAGNOSIS — I10 Essential (primary) hypertension: Secondary | ICD-10-CM | POA: Diagnosis not present

## 2020-07-07 LAB — LIPID PANEL
Cholesterol: 245 mg/dL — ABNORMAL HIGH (ref 0–200)
HDL: 42.8 mg/dL (ref >39.00)
LDL Cholesterol: 173 mg/dL — ABNORMAL HIGH (ref 0–99)
NonHDL: 202.34
Total CHOL/HDL Ratio: 6
Triglycerides: 148 mg/dL (ref 0.0–149.0)
VLDL: 29.6 mg/dL (ref 0.0–40.0)

## 2020-07-07 LAB — COMPREHENSIVE METABOLIC PANEL
ALT: 14 U/L (ref 0–35)
AST: 18 U/L (ref 0–37)
Albumin: 4.3 g/dL (ref 3.5–5.2)
Alkaline Phosphatase: 50 U/L (ref 39–117)
BUN: 19 mg/dL (ref 6–23)
CO2: 23 mEq/L (ref 19–32)
Calcium: 9.7 mg/dL (ref 8.4–10.5)
Chloride: 107 mEq/L (ref 96–112)
Creatinine, Ser: 0.93 mg/dL (ref 0.40–1.20)
GFR: 58.89 mL/min — ABNORMAL LOW (ref >60.00)
Glucose, Bld: 100 mg/dL — ABNORMAL HIGH (ref 70–99)
Potassium: 4.2 mEq/L (ref 3.5–5.1)
Sodium: 139 mEq/L (ref 135–145)
Total Bilirubin: 0.8 mg/dL (ref 0.2–1.2)
Total Protein: 7.1 g/dL (ref 6.0–8.3)

## 2020-07-07 MED ORDER — CELECOXIB 200 MG PO CAPS: 200.0000 mg | ORAL_CAPSULE | Freq: Every day | ORAL | 1 refills | Status: DC

## 2020-07-07 MED ORDER — LISINOPRIL 10 MG PO TABS: 10.0000 mg | ORAL_TABLET | Freq: Every day | ORAL | 3 refills | Status: DC

## 2020-07-07 MED ORDER — METHOCARBAMOL 500 MG PO TABS: 500.0000 mg | ORAL_TABLET | Freq: Every day | ORAL | 1 refills | Status: DC | PRN

## 2020-07-07 NOTE — Progress Notes (Signed)
[name] is a 74 y.o. female  Chief Complaint  Patient presents with  . Establish Care    NP- CPE, fasting this am    HPI: [name] is a 74 y.o. female here today to establish care with our office and for routine f/u on chronic medical issues including HTN, hyperlipidemia, OA and lumbar radiculopathy, and a h/o breast cancer. Previous PCP Dr. Delia Chimes - Primary Care at [facility]. She had Rt TKA in 02/2020. She still has some soreness but ortho feels she is doing well.  Pt states she has not been able to tolerate 3 different cholesterol meds/statins. She has a lot of back pain - chronic but worse in the past year or so.  Last mammo: 06/2019 and pt has upcoming appt Last Dexa: ? 2017 - T-score = -1.0 Last colonoscopy: last done a few years ago with [facility] and was told no further needed  Med refills needed today? Yes - see orders   Past Medical History:  Diagnosis Date  . Arthritis   . Cancer ([place]) 01/07/2006   Breast cancer- L Breast  . Cataract   . Complication of anesthesia   . Hypertension   . Injury of ligament of hand    right thumb ulnar collateral ligament injury.  recommend MR arthrogram of the thumb.  Marland Kitchen Neuromuscular disorder ([place])    scoliosis s/p spine surgery, has arthritis and pinched nerves  . PONV (postoperative nausea and vomiting)     Past Surgical History:  Procedure Laterality Date  . ABDOMINAL HYSTERECTOMY  2002   have ovaries  . BREAST SURGERY  2006   left, cancer  . EYE SURGERY  2012  . [place] and 2007  . TOTAL KNEE ARTHROPLASTY Right 03/07/2020   Procedure: TOTAL KNEE ARTHROPLASTY;  Surgeon: Paralee Cancel, MD;  Location: [facility] ORS;  Service: Orthopedics;  Laterality: Right;  70 mins  . TUBAL LIGATION      Social History   Socioeconomic History  . Marital status: Married    Spouse name: Not on file  . Number of children: Not on file  . Years of education: Not on file  . Highest education level: Not on file    Occupational History  . Occupation: Retired  Tobacco Use  . Smoking status: Never Smoker  . Smokeless tobacco: Never Used  Vaping Use  . Vaping Use: Never used  Substance and Sexual Activity  . Alcohol use: No  . Drug use: No  . Sexual activity: Yes  Other Topics Concern  . Not on file  Social History Narrative   Married   Education: [employer]   Exercise: two times a week   Social Determinants of Radio broadcast assistant Strain:   . Difficulty of Paying Living Expenses: Not on file  Food Insecurity:   . Worried About Charity fundraiser in the Last Year: Not on file  . Ran Out of Food in the Last Year: Not on file  Transportation Needs:   . Lack of Transportation (Medical): Not on file  . Lack of Transportation (Non-Medical): Not on file  Physical Activity:   . Days of Exercise per Week: Not on file  . Minutes of Exercise per Session: Not on file  Stress:   . Feeling of Stress : Not on file  Social Connections:   . Frequency of Communication with Friends and Family: Not on file  . Frequency of Social Gatherings with Friends and  Family: Not on file  . Attends Religious Services: Not on file  . Active Member of Clubs or Organizations: Not on file  . Attends Archivist Meetings: Not on file  . Marital Status: Not on file  Intimate Partner Violence:   . Fear of Current or Ex-Partner: Not on file  . Emotionally Abused: Not on file  . Physically Abused: Not on file  . Sexually Abused: Not on file    Family History  Problem Relation Age of Onset  . Diabetes Mother        age early 74's  . Heart disease Mother        age early 69's or 82's  . Hypertension Mother   . Hyperlipidemia Mother   . Stroke Father 18       per pt mini  . Diabetes Sister 14       type II  . Hyperlipidemia Sister   . Hypertension Sister   . Kidney disease Brother        polycystic kidney  . Diabetes Brother   . Heart disease Brother   . Alcohol abuse Maternal Grandfather    . Cancer Maternal Grandmother        Leukemia  . Cancer Paternal Aunt 14       Breast     Immunization History  Administered Date(s) Administered  . Hepatitis B 09/18/2011, 02/10/2012  . Influenza, High Dose Seasonal PF 07/27/2014, 07/13/2015, 07/30/2016  . Influenza,inj,Quad PF,6+ Mos 08/02/2019  . Influenza-Unspecified 06/21/2013, 07/22/2015  . Moderna SARS-COVID-2 Vaccination 11/05/2019, 12/06/2019  . Pneumococcal Conjugate-13 01/09/2015  . Pneumococcal-Unspecified 08/02/2011  . Tdap 07/22/2011  . Zoster 08/21/2010    Outpatient Encounter Medications as of 07/07/2020  Medication Sig  . acetaminophen (TYLENOL 8 HOUR) 650 MG CR tablet Tylenol Arthritis Pain 650 mg tablet,extended release   1 tablet as needed by oral route.  Marland Kitchen aspirin 81 MG EC tablet Adult Low Dose Aspirin 81 mg tablet,delayed release   1 tablet every day by oral route.  . celecoxib (CELEBREX) 200 MG capsule Take 1 capsule (200 mg total) by mouth daily.  . Cholecalciferol (VITAMIN D) 125 MCG (5000 UT) CAPS Take 5,000 Units by mouth daily.   Marland Kitchen lisinopril (ZESTRIL) 10 MG tablet Take 1 tablet (10 mg total) by mouth daily.  . methocarbamol (ROBAXIN) 500 MG tablet Take 1 tablet (500 mg total) by mouth daily as needed for muscle spasms.  . Multiple Vitamin (MULTIVITAMIN) tablet Take 1 tablet by mouth daily.  . Omega-3 Fatty Acids (FISH OIL) 1000 MG CAPS Take 1,000 mg by mouth daily.  . S-Adenosylmethionine (SAM-E) 200 MG TABS Take 200 mg by mouth daily.  . TURMERIC PO Take 1,500 mg by mouth daily.   . [DISCONTINUED] celecoxib (CELEBREX) 200 MG capsule Take 1 capsule (200 mg total) by mouth 2 (two) times daily.  . [DISCONTINUED] Ibuprofen (ADVIL) 200 MG CAPS Advil  . [DISCONTINUED] lisinopril (ZESTRIL) 10 MG tablet Take 1 tablet (10 mg total) by mouth daily.  . [DISCONTINUED] methocarbamol (ROBAXIN) 500 MG tablet Take 1 tablet (500 mg total) by mouth every 6 (six) hours as needed for muscle spasms.  . [DISCONTINUED]  docusate sodium (COLACE) 100 MG capsule Take 1 capsule (100 mg total) by mouth 2 (two) times daily.  . [DISCONTINUED] ferrous sulfate (FERROUSUL) 325 (65 FE) MG tablet Take 1 tablet (325 mg total) by mouth 3 (three) times daily with meals for 14 days.  . [DISCONTINUED] HYDROcodone-acetaminophen (NORCO) 5-325 MG tablet Take 1-2  tablets by mouth every 4 (four) hours as needed for moderate pain or severe pain.  . [DISCONTINUED] polyethylene glycol (MIRALAX / GLYCOLAX) 17 g packet Take 17 g by mouth 2 (two) times daily.  . [DISCONTINUED] tiZANidine (ZANAFLEX) 4 MG capsule TAKE 1 CAPSULE (4 MG TOTAL) BY MOUTH 3 (THREE) TIMES DAILY AS NEEDED FOR MUSCLE SPASMS. (Patient not taking: Reported on 07/07/2020)   No facility-administered encounter medications on file as of 07/07/2020.     ROS: Gen: no fever, chills  Skin: no rash, itching ENT: no ear pain, ear drainage, nasal congestion, rhinorrhea, sinus pressure, sore throat Eyes: no blurry vision, double vision Resp: no cough, wheeze,SOB Breast: no breast tenderness, no nipple discharge, no breast masses CV: no CP, palpitations, LE edema,  GI: no heartburn, n/v/d/c, abd pain GU: no dysuria, urgency, frequency, hematuria MSK: no joint pain, myalgias, back pain Neuro: no dizziness, headache, weakness, vertigo Psych: no depression, anxiety, insomnia   Allergies  Allergen Reactions  . Albuterol Other (See Comments)    Heart race, tremble and studdering  . Codeine Other (See Comments)    DIZZINESS  . Hydrocodone-Acetaminophen     Hives, itching  . Pravachol [Pravastatin Sodium] Other (See Comments)    Per patient caused headaches and GI upset    BP (!) 144/90   Pulse 87   Temp (!) 97.5 F (36.4 C) (Temporal)   Ht 5' 5.5" (1.664 m)   Wt 169 lb 12.8 oz (77 kg)   SpO2 98%   BMI 27.83 kg/m    BP Readings from Last 3 Encounters:  07/07/20 (!) 144/90  03/08/20 140/74  02/29/20 137/79   Pulse Readings from Last 3 Encounters:  07/07/20 87   03/08/20 78  02/29/20 77   Wt Readings from Last 3 Encounters:  07/07/20 169 lb 12.8 oz (77 kg)  03/07/20 178 lb 9.2 oz (81 kg)  02/29/20 178 lb 9 oz (81 kg)    Physical Exam Constitutional:      General: She is not in acute distress.    Appearance: She is well-developed.  HENT:     Head: Normocephalic and atraumatic.     Right Ear: Tympanic membrane and ear canal normal.     Left Ear: Tympanic membrane and ear canal normal.     Nose: Nose normal.  Eyes:     Conjunctiva/sclera: Conjunctivae normal.     Pupils: Pupils are equal, round, and reactive to light.  Neck:     Thyroid: No thyromegaly.  Cardiovascular:     Rate and Rhythm: Normal rate and regular rhythm.     Heart sounds: Normal heart sounds. No murmur heard.   Pulmonary:     Effort: Pulmonary effort is normal. No respiratory distress.     Breath sounds: Normal breath sounds. No wheezing or rhonchi.  Abdominal:     General: Bowel sounds are normal. There is no distension.     Palpations: Abdomen is soft. There is no mass.     Tenderness: There is no abdominal tenderness.  Musculoskeletal:     Cervical back: Neck supple.  Lymphadenopathy:     Cervical: No cervical adenopathy.  Skin:    General: Skin is warm and dry.  Neurological:     Mental Status: She is alert and oriented to person, place, and time.     Motor: No abnormal muscle tone.     Coordination: Coordination normal.  Psychiatric:        Behavior: Behavior normal.  A/P:  1. Essential hypertension - slightly elevated today. Pt does check at home and states Bps 120-130/70-80 - Comprehensive metabolic panel Refill: - lisinopril (ZESTRIL) 10 MG tablet; Take 1 tablet (10 mg total) by mouth daily.  Dispense: 90 tablet; Refill: 3 - f/u in 6 mo  2. Mixed hyperlipidemia - has been unable to tolerate 3 statin meds in the past - Lipid panel  3. History of left breast cancer - mammo scheduled in 2 wks ([facility])  4. Lumbar radiculopathy,  chronic - chronic but worse in past 1 year or so Refill: - methocarbamol (ROBAXIN) 500 MG tablet; Take 1 tablet (500 mg total) by mouth daily as needed for muscle spasms.  Dispense: 90 tablet; Refill: 1 - celecoxib (CELEBREX) 200 MG capsule; Take 1 capsule (200 mg total) by mouth daily.  Dispense: 180 capsule; Refill: 1  5. Screening for osteoporosis - DG Bone Density; Future    This visit occurred during the SARS-CoV-2 public health emergency.  Safety protocols were in place, including screening questions prior to the visit, additional usage of staff PPE, and extensive cleaning of exam room while observing appropriate contact time as indicated for disinfecting solutions.

## 2020-07-11 ENCOUNTER — Encounter: Payer: Medicare HMO | Admitting: Registered Nurse

## 2020-07-19 DIAGNOSIS — Z1231 Encounter for screening mammogram for malignant neoplasm of breast: Secondary | ICD-10-CM | POA: Diagnosis not present

## 2020-07-19 DIAGNOSIS — Z780 Asymptomatic menopausal state: Secondary | ICD-10-CM | POA: Diagnosis not present

## 2020-07-19 DIAGNOSIS — M85851 Other specified disorders of bone density and structure, right thigh: Secondary | ICD-10-CM | POA: Diagnosis not present

## 2020-07-19 LAB — HM DEXA SCAN

## 2020-07-19 LAB — HM MAMMOGRAPHY

## 2020-07-20 ENCOUNTER — Encounter: Payer: Self-pay | Admitting: Family Medicine

## 2020-07-24 ENCOUNTER — Telehealth: Payer: Self-pay | Admitting: Family Medicine

## 2020-07-24 NOTE — Telephone Encounter (Signed)
Left message for patient to schedule Annual Wellness Visit.  Please schedule with Nurse Health Advisor Martha Stanley, RN at [facility]  °

## 2020-07-25 ENCOUNTER — Ambulatory Visit: Payer: Medicare HMO

## 2020-07-25 ENCOUNTER — Ambulatory Visit (INDEPENDENT_AMBULATORY_CARE_PROVIDER_SITE_OTHER): Payer: Medicare HMO

## 2020-07-25 VITALS — Ht 65.0 in | Wt 169.0 lb

## 2020-07-25 DIAGNOSIS — Z0000 Encounter for general adult medical examination without abnormal findings: Secondary | ICD-10-CM

## 2020-07-25 NOTE — Patient Instructions (Signed)
[name] , Thank you for taking time to complete your Medicare Wellness Visit. I appreciate your ongoing commitment to your health goals. Please review the following plan we discussed and let me know if I can assist you in the future.   Screening recommendations/referrals: Colonoscopy: Completed 10/21/2014-Due-10/21/2014-Not indicated after age 74 Mammogram: Completed 07/19/2020-Due 07/19/2021 Bone Density: Completed 07/18/20-Due 07/19/2022 Recommended yearly ophthalmology/optometry visit for glaucoma screening and checkup Recommended yearly dental visit for hygiene and checkup  Vaccinations: Influenza vaccine: Up to Date Pneumococcal vaccine: Completed vaccines Tdap vaccine: Up to Date- Due-07/21/2021 Shingles vaccine: Discuss with pharmacy Covid-19:Completed vaccines  Advanced directives: Documents on file with family.  Conditions/risks identified: See problem list  Next appointment: Follow up in one year for your annual wellness visit 07/26/2021 @ 8:15am.   Preventive Care 65 Years and Older, Female Preventive care refers to lifestyle choices and visits with your health care provider that can promote health and wellness. What does preventive care include?  A yearly physical exam. This is also called an annual well check.  Dental exams once or twice a year.  Routine eye exams. Ask your health care provider how often you should have your eyes checked.  Personal lifestyle choices, including:  Daily care of your teeth and gums.  Regular physical activity.  Eating a healthy diet.  Avoiding tobacco and drug use.  Limiting alcohol use.  Practicing safe sex.  Taking low-dose aspirin every day.  Taking vitamin and mineral supplements as recommended by your health care provider. What happens during an annual well check? The services and screenings done by your health care provider during your annual well check will depend on your age, overall health, lifestyle risk factors, and  family history of disease. Counseling  Your health care provider may ask you questions about your:  Alcohol use.  Tobacco use.  Drug use.  Emotional well-being.  Home and relationship well-being.  Sexual activity.  Eating habits.  History of falls.  Memory and ability to understand (cognition).  Work and work Statistician.  Reproductive health. Screening  You may have the following tests or measurements:  Height, weight, and BMI.  Blood pressure.  Lipid and cholesterol levels. These may be checked every 5 years, or more frequently if you are over 39 years old.  Skin check.  Lung cancer screening. You may have this screening every year starting at age 36 if you have a 30-pack-year history of smoking and currently smoke or have quit within the past 15 years.  Fecal occult blood test (FOBT) of the stool. You may have this test every year starting at age 24.  Flexible sigmoidoscopy or colonoscopy. You may have a sigmoidoscopy every 5 years or a colonoscopy every 10 years starting at age 70.  Hepatitis C blood test.  Hepatitis B blood test.  Sexually transmitted disease (STD) testing.  Diabetes screening. This is done by checking your blood sugar (glucose) after you have not eaten for a while (fasting). You may have this done every 1-3 years.  Bone density scan. This is done to screen for osteoporosis. You may have this done starting at age 12.  Mammogram. This may be done every 1-2 years. Talk to your health care provider about how often you should have regular mammograms. Talk with your health care provider about your test results, treatment options, and if necessary, the need for more tests. Vaccines  Your health care provider may recommend certain vaccines, such as:  Influenza vaccine. This is recommended every year.  Tetanus, diphtheria, and acellular pertussis (Tdap, Td) vaccine. You may need a Td booster every 10 years.  Zoster vaccine. You may need this  after age 82.  Pneumococcal 13-valent conjugate (PCV13) vaccine. One dose is recommended after age 41.  Pneumococcal polysaccharide (PPSV23) vaccine. One dose is recommended after age 57. Talk to your health care provider about which screenings and vaccines you need and how often you need them. This information is not intended to replace advice given to you by your health care provider. Make sure you discuss any questions you have with your health care provider. Document Released: 11/03/2015 Document Revised: 06/26/2016 Document Reviewed: 08/08/2015 Elsevier Interactive Patient Education  2017 [place] Prevention in the Home Falls can cause injuries. They can happen to people of all ages. There are many things you can do to make your home safe and to help prevent falls. What can I do on the outside of my home?  Regularly fix the edges of walkways and driveways and fix any cracks.  Remove anything that might make you trip as you walk through a door, such as a raised step or threshold.  Trim any bushes or trees on the path to your home.  Use bright outdoor lighting.  Clear any walking paths of anything that might make someone trip, such as rocks or tools.  Regularly check to see if handrails are loose or broken. Make sure that both sides of any steps have handrails.  Any raised decks and porches should have guardrails on the edges.  Have any leaves, snow, or ice cleared regularly.  Use sand or salt on walking paths during winter.  Clean up any spills in your garage right away. This includes oil or grease spills. What can I do in the bathroom?  Use night lights.  Install grab bars by the toilet and in the tub and shower. Do not use towel bars as grab bars.  Use non-skid mats or decals in the tub or shower.  If you need to sit down in the shower, use a plastic, non-slip stool.  Keep the floor dry. Clean up any water that spills on the floor as soon as it  happens.  Remove soap buildup in the tub or shower regularly.  Attach bath mats securely with double-sided non-slip rug tape.  Do not have throw rugs and other things on the floor that can make you trip. What can I do in the bedroom?  Use night lights.  Make sure that you have a light by your bed that is easy to reach.  Do not use any sheets or blankets that are too big for your bed. They should not hang down onto the floor.  Have a firm chair that has side arms. You can use this for support while you get dressed.  Do not have throw rugs and other things on the floor that can make you trip. What can I do in the kitchen?  Clean up any spills right away.  Avoid walking on wet floors.  Keep items that you use a lot in easy-to-reach places.  If you need to reach something above you, use a strong step stool that has a grab bar.  Keep electrical cords out of the way.  Do not use floor polish or wax that makes floors slippery. If you must use wax, use non-skid floor wax.  Do not have throw rugs and other things on the floor that can make you trip. What can I do  with my stairs?  Do not leave any items on the stairs.  Make sure that there are handrails on both sides of the stairs and use them. Fix handrails that are broken or loose. Make sure that handrails are as long as the stairways.  Check any carpeting to make sure that it is firmly attached to the stairs. Fix any carpet that is loose or worn.  Avoid having throw rugs at the top or bottom of the stairs. If you do have throw rugs, attach them to the floor with carpet tape.  Make sure that you have a light switch at the top of the stairs and the bottom of the stairs. If you do not have them, ask someone to add them for you. What else can I do to help prevent falls?  Wear shoes that:  Do not have high heels.  Have rubber bottoms.  Are comfortable and fit you well.  Are closed at the toe. Do not wear sandals.  If you  use a stepladder:  Make sure that it is fully opened. Do not climb a closed stepladder.  Make sure that both sides of the stepladder are locked into place.  Ask someone to hold it for you, if possible.  Clearly mark and make sure that you can see:  Any grab bars or handrails.  First and last steps.  Where the edge of each step is.  Use tools that help you move around (mobility aids) if they are needed. These include:  Canes.  Walkers.  Scooters.  Crutches.  Turn on the lights when you go into a dark area. Replace any light bulbs as soon as they burn out.  Set up your furniture so you have a clear path. Avoid moving your furniture around.  If any of your floors are uneven, fix them.  If there are any pets around you, be aware of where they are.  Review your medicines with your doctor. Some medicines can make you feel dizzy. This can increase your chance of falling. Ask your doctor what other things that you can do to help prevent falls. This information is not intended to replace advice given to you by your health care provider. Make sure you discuss any questions you have with your health care provider. Document Released: 08/03/2009 Document Revised: 03/14/2016 Document Reviewed: 11/11/2014 Elsevier Interactive Patient Education  2017 [employer].

## 2020-07-25 NOTE — Progress Notes (Signed)
Subjective:   [name] is a 74 y.o. female who presents for Medicare Annual (Subsequent) preventive examination.  I connected with [place] today by telephone and verified that I am speaking with the correct person using two identifiers. Location patient: home Location provider: work Persons participating in the virtual visit: patient, Marine scientist.    I discussed the limitations, risks, security and privacy concerns of performing an evaluation and management service by telephone and the availability of in person appointments. I also discussed with the patient that there may be a patient responsible charge related to this service. The patient expressed understanding and verbally consented to this telephonic visit.    Interactive audio and video telecommunications were attempted between this provider and patient, however failed, due to patient having technical difficulties OR patient did not have access to video capability.  We continued and completed visit with audio only.  Some vital signs may be absent or patient reported.   Time Spent with patient on telephone encounter: 20 minutes  Review of Systems     Cardiac Risk Factors include: advanced age (>26men, >59 women);hypertension;dyslipidemia     Objective:    Today's Vitals   07/25/20 0814 07/25/20 0815  Weight: 169 lb (76.7 kg)   Height: 5\' 5"  (1.651 m)   PainSc:  3    Body mass index is 28.12 kg/m.  Advanced Directives 07/25/2020 03/07/2020 02/29/2020 03/04/2017  Does Patient Have a Medical Advance Directive? Yes Yes Yes Yes  Type of Paramedic of [place];Living will [place];Living will [place] -  Does patient want to make changes to medical advance directive? - No - Patient declined No - Patient declined -  Copy of [place] in Chart? No - copy requested Yes - validated most recent copy scanned in chart (See row information) No - copy requested -     Current Medications (verified) Outpatient Encounter Medications as of 07/25/2020  Medication Sig  . acetaminophen (TYLENOL 8 HOUR) 650 MG CR tablet Tylenol Arthritis Pain 650 mg tablet,extended release   1 tablet as needed by oral route.  Marland Kitchen aspirin 81 MG EC tablet Adult Low Dose Aspirin 81 mg tablet,delayed release   1 tablet every day by oral route.  . celecoxib (CELEBREX) 200 MG capsule Take 1 capsule (200 mg total) by mouth daily.  . Cholecalciferol (VITAMIN D) 125 MCG (5000 UT) CAPS Take 5,000 Units by mouth daily.   Marland Kitchen lisinopril (ZESTRIL) 10 MG tablet Take 1 tablet (10 mg total) by mouth daily.  . methocarbamol (ROBAXIN) 500 MG tablet Take 1 tablet (500 mg total) by mouth daily as needed for muscle spasms.  . Multiple Vitamin (MULTIVITAMIN) tablet Take 1 tablet by mouth daily.  . Omega-3 Fatty Acids (FISH OIL) 1000 MG CAPS Take 1,000 mg by mouth daily.  . TURMERIC PO Take 1,500 mg by mouth daily.   . S-Adenosylmethionine (SAM-E) 200 MG TABS Take 200 mg by mouth daily. (Patient not taking: Reported on 07/25/2020)   No facility-administered encounter medications on file as of 07/25/2020.    Allergies (verified) Albuterol, Codeine, Hydrocodone-acetaminophen, and Pravachol [pravastatin sodium]   History: Past Medical History:  Diagnosis Date  . Arthritis   . Cancer ([place]) 01/07/2006   Breast cancer- L Breast  . Cataract   . Complication of anesthesia   . Hypertension   . Injury of ligament of hand    right thumb ulnar collateral ligament injury.  recommend MR arthrogram of  the thumb.  Marland Kitchen Neuromuscular disorder ([place])    scoliosis s/p spine surgery, has arthritis and pinched nerves  . PONV (postoperative nausea and vomiting)    Past Surgical History:  Procedure Laterality Date  . ABDOMINAL HYSTERECTOMY  2002   have ovaries  . BREAST SURGERY  2006   left, cancer  . EYE SURGERY  2012  . [place] and 2007  . TOTAL KNEE ARTHROPLASTY Right 03/07/2020   Procedure:  TOTAL KNEE ARTHROPLASTY;  Surgeon: Paralee Cancel, MD;  Location: [facility];  Service: Orthopedics;  Laterality: Right;  70 mins  . TUBAL LIGATION     Family History  Problem Relation Age of Onset  . Diabetes Mother        age early 25's  . Heart disease Mother        age early 21's or 36's  . Hypertension Mother   . Hyperlipidemia Mother   . Stroke Father 61       per pt mini  . Diabetes Sister 44       type II  . Hyperlipidemia Sister   . Hypertension Sister   . Kidney disease Brother        polycystic kidney  . Diabetes Brother   . Heart disease Brother   . Alcohol abuse Maternal Grandfather   . Cancer Maternal Grandmother        Leukemia  . Cancer Paternal Aunt 6       Breast   Social History   Socioeconomic History  . Marital status: Married    Spouse name: Not on file  . Number of children: Not on file  . Years of education: Not on file  . Highest education level: Not on file  Occupational History  . Occupation: Retired  Tobacco Use  . Smoking status: Never Smoker  . Smokeless tobacco: Never Used  Vaping Use  . Vaping Use: Never used  Substance and Sexual Activity  . Alcohol use: No  . Drug use: No  . Sexual activity: Yes  Other Topics Concern  . Not on file  Social History Narrative   Married   Education: [employer]   Exercise: two times a week   Social Determinants of Health   Financial Resource Strain: Low Risk   . Difficulty of Paying Living Expenses: Not hard at all  Food Insecurity: No Food Insecurity  . Worried About Charity fundraiser in the Last Year: Never true  . Ran Out of Food in the Last Year: Never true  Transportation Needs: No Transportation Needs  . Lack of Transportation (Medical): No  . Lack of Transportation (Non-Medical): No  Physical Activity: Sufficiently Active  . Days of Exercise per Week: 5 days  . Minutes of Exercise per Session: 40 min  Stress: No Stress Concern Present  . Feeling of Stress : Not at all  Social  Connections: Socially Integrated  . Frequency of Communication with Friends and Family: More than three times a week  . Frequency of Social Gatherings with Friends and Family: Once a week  . Attends Religious Services: 1 to 4 times per year  . Active Member of Clubs or Organizations: Yes  . Attends Archivist Meetings: 1 to 4 times per year  . Marital Status: Married    Tobacco Counseling Counseling given: Not Answered   Clinical Intake:  Pre-visit preparation completed: No  Pain : 0-10 Pain Score: 3  Pain Type: Chronic pain Pain Location: Generalized Pain Onset:  More than a month ago Pain Frequency: Constant     Nutritional Status: BMI 25 -29 Overweight Nutritional Risks: None Diabetes: No  How often do you need to have someone help you when you read instructions, pamphlets, or other written materials from your doctor or pharmacy?: 1 - Never What is the last grade level you completed in school?: GED  Diabetic?No  Interpreter Needed?: No  Information entered by :: Caroleen Hamman LPN   Activities of Daily Living In your present state of health, do you have any difficulty performing the following activities: 07/25/2020 03/07/2020  Hearing? Y N  Comment left ear -  Vision? N N  Difficulty concentrating or making decisions? N N  Walking or climbing stairs? N N  Dressing or bathing? N N  Doing errands, shopping? N N  Preparing Food and eating ? N -  Using the Toilet? N -  In the past six months, have you accidently leaked urine? N -  Do you have problems with loss of bowel control? N -  Managing your Medications? N -  Managing your Finances? N -  Housekeeping or managing your Housekeeping? N -  Some recent data might be hidden    Patient Care Team: Ronnald Nian, DO as PCP - General (Family Medicine) Wonda Horner, MD as Consulting Physician (Gastroenterology) Zonia Kief, MD as Attending Physician (Rehabilitation)  Indicate any recent  Medical Services you may have received from other than [facility] providers in the past year (date may be approximate).     Assessment:   This is a routine wellness examination for [place].  Hearing/Vision screen  Hearing Screening   125Hz  250Hz  500Hz  1000Hz  2000Hz  3000Hz  4000Hz  6000Hz  8000Hz   Right ear:           Left ear:           Comments: Mild loss in left ear  Vision Screening Comments: Cataracts removed Reading glasses Last eye exam-2019-Dr. Tommy Rainwater  Dietary issues and exercise activities discussed: Current Exercise Habits: Home exercise routine, Type of exercise: walking;calisthenics, Time (Minutes): 40, Frequency (Times/Week): 5, Weekly Exercise (Minutes/Week): 200, Exercise limited by: orthopedic condition(s)  Goals    . Patient Stated     Would like to lose 10 pounds & be more active      Depression Screen PHQ 2/9 Scores 07/25/2020 02/28/2020 11/04/2019 06/10/2019 05/26/2019 04/02/2018 05/30/2016  PHQ - 2 Score 0 0 0 0 0 0 0    Fall Risk Fall Risk  07/25/2020 02/28/2020 11/04/2019 06/10/2019 05/26/2019  Falls in the past year? 1 1 0 0 0  Number falls in past yr: 0 0 - - 0  Injury with Fall? 1 1 0 - 0  Comment - fell on already bad right knee - - -  Risk for fall due to : History of fall(s) - - - -  Follow up Falls prevention discussed Falls evaluation completed Falls evaluation completed Falls evaluation completed Falls evaluation completed    Any stairs in or around the home? Yes  If so, are there any without handrails? No  Home free of loose throw rugs in walkways, pet beds, electrical cords, etc? Yes  Adequate lighting in your home to reduce risk of falls? Yes   ASSISTIVE DEVICES UTILIZED TO PREVENT FALLS:  Life alert? No  Use of a cane, walker or w/c? Yes cane occasionally Grab bars in the bathroom? No  Shower chair or bench in shower? No  Elevated toilet seat or a handicapped toilet? No  TIMED UP AND GO:  Was the test performed? No . Phone visit  Cognitive  Function:No cognitive impairment noted. Patient plays Sodoku & other games for brain health.        Immunizations Immunization History  Administered Date(s) Administered  . Fluad Quad(high Dose 65+) 07/07/2020  . Hepatitis B 09/18/2011, 02/10/2012  . Influenza, High Dose Seasonal PF 07/27/2014, 07/13/2015, 07/30/2016  . Influenza,inj,Quad PF,6+ Mos 08/02/2019  . Influenza-Unspecified 06/21/2013, 07/22/2015  . Moderna SARS-COVID-2 Vaccination 11/05/2019, 12/06/2019  . Pneumococcal Conjugate-13 01/09/2015  . Pneumococcal-Unspecified 08/02/2011  . Tdap 07/22/2011  . Zoster 08/21/2010    TDAP status: Up to date   Flu Vaccine status: Up to date   Pneumococcal vaccine status: Up to date   Covid-19 vaccine status: Completed vaccines  Qualifies for Shingles Vaccine? Yes   Zostavax completed No   Shingrix Completed?: No.    Education has been provided regarding the importance of this vaccine. Patient has been advised to call insurance company to determine out of pocket expense if they have not yet received this vaccine. Advised may also receive vaccine at local pharmacy or Health Dept. Verbalized acceptance and understanding.  Screening Tests Health Maintenance  Topic Date Due  . TETANUS/TDAP  07/21/2021  . MAMMOGRAM  07/19/2022  . COLONOSCOPY  10/21/2024  . INFLUENZA VACCINE  Completed  . DEXA SCAN  Completed  . COVID-19 Vaccine  Completed  . Hepatitis C Screening  Completed  . PNA vac Low Risk Adult  Completed    Health Maintenance  There are no preventive care reminders to display for this patient.  Colorectal cancer screening: Completed 10/21/2014. Repeat every 10 years   Mammogram status: Completed 07/19/2020. Repeat every year   Bone Density status: Completed 07/19/2020. Results reflect: Bone density results: OSTEOPENIA. Repeat every 2 years.  Lung Cancer Screening: (Low Dose CT Chest recommended if Age 58-80 years, 30 pack-year currently smoking OR have quit w/in  15years.) does not qualify.     Additional Screening:  Hepatitis C Screening: Completed 05/26/2019  Vision Screening: Recommended annual ophthalmology exams for early detection of glaucoma and other disorders of the eye. Is the patient up to date with their annual eye exam?  No  Who is the provider or what is the name of the office in which the patient attends annual eye exams? Dr. Tommy Rainwater Patient states she has not been to the eye doctor due to Covid & plans to make an appt once the pandemic is over.  Dental Screening: Recommended annual dental exams for proper oral hygiene  Community Resource Referral / Chronic Care Management: CRR required this visit?  No   CCM required this visit?  No      Plan:     I have personally reviewed and noted the following in the patient's chart:   . Medical and social history . Use of alcohol, tobacco or illicit drugs  . Current medications and supplements . Functional ability and status . Nutritional status . Physical activity . Advanced directives . List of other physicians . Hospitalizations, surgeries, and ER visits in previous 12 months . Vitals . Screenings to include cognitive, depression, and falls . Referrals and appointments  In addition, I have reviewed and discussed with patient certain preventive protocols, quality metrics, and best practice recommendations. A written personalized care plan for preventive services as well as general preventive health recommendations were provided to patient.    Due to this being a telephonic visit, the after visit summary with patients personalized plan  was offered to patient via mail or my-chart. Patient would like to access on my-chart.   Marta Antu, LPN   [id number]  Nurse Health Advisor  Nurse Notes: None

## 2020-07-26 ENCOUNTER — Encounter: Payer: Self-pay | Admitting: Family Medicine

## 2020-08-11 ENCOUNTER — Encounter: Payer: Self-pay | Admitting: Family Medicine

## 2020-08-11 ENCOUNTER — Other Ambulatory Visit: Payer: Self-pay

## 2020-08-14 DIAGNOSIS — Z0130 Encounter for examination of blood pressure without abnormal findings: Secondary | ICD-10-CM | POA: Diagnosis not present

## 2020-08-14 DIAGNOSIS — H66003 Acute suppurative otitis media without spontaneous rupture of ear drum, bilateral: Secondary | ICD-10-CM | POA: Diagnosis not present

## 2020-08-14 DIAGNOSIS — I10 Essential (primary) hypertension: Secondary | ICD-10-CM | POA: Diagnosis not present

## 2020-08-18 ENCOUNTER — Encounter: Payer: Self-pay | Admitting: Nurse Practitioner

## 2020-08-18 ENCOUNTER — Telehealth (INDEPENDENT_AMBULATORY_CARE_PROVIDER_SITE_OTHER): Payer: Medicare HMO | Admitting: Nurse Practitioner

## 2020-08-18 VITALS — Temp 97.9°F | Ht 65.0 in | Wt 170.0 lb

## 2020-08-18 DIAGNOSIS — H9202 Otalgia, left ear: Secondary | ICD-10-CM

## 2020-08-18 MED ORDER — CETIRIZINE HCL 10 MG PO TABS: 10.0000 mg | ORAL_TABLET | Freq: Every day | ORAL | 0 refills | Status: DC

## 2020-08-18 NOTE — Progress Notes (Signed)
Virtual Visit via Video Note  I connected with@ on 08/18/20 at 10:15 AM EDT by a video enabled telemedicine application and verified that I am speaking with the correct person using two identifiers.  Location: Patient:Home Provider: Office Participants: patient and provider   I connected with Patient Name today by a video enabled telemedicine application and verified that I am speaking with the correct person using two identifiers. Location patient: home Location provider: work Persons participating in the virtual visit: patient, provider  I discussed the limitations of evaluation and management by telemedicine and the availability of in person appointments. I also discussed with the patient that there may be a patient responsible charge related to this service. The patient expressed understanding and agreed to proceed.  CC:Pt c/o ear pain x1 week, [facility] and was informed to contact her PCP by the end of the week if it has not got any better. Pt states she had a complete knee replacement 5 months ago and they warned her about infections so she wants to be sure she does not have a ear infection. Pt states she has had low grade fevers of 99.7  History of Present Illness: Started augmentin 825/125mg  on 08/14/2020, prescribed for 10days. She also started flonase daily on 08/14/2020. Low grade fever resolved on 08/16/2020. Otalgia  There is pain in the left ear. This is a new problem. The current episode started in the past 7 days. The problem occurs constantly. The problem has been unchanged. There has been no fever. The pain is moderate. Pertinent negatives include no abdominal pain, coughing, diarrhea, ear discharge, headaches, hearing loss, neck pain, rash, rhinorrhea, sore throat or vomiting. She has tried antibiotics for the symptoms. The treatment provided mild relief. There is no history of a chronic ear infection, hearing loss or a tympanostomy  tube.   Observations/Objective: Physical Exam Constitutional:      General: She is not in acute distress. HENT:     Head:     Jaw: No trismus or swelling.     Salivary Glands: Right salivary gland is not diffusely enlarged. Left salivary gland is not diffusely enlarged.     Right Ear: External ear normal. No mastoid tenderness.     Left Ear: External ear normal. No mastoid tenderness.  Musculoskeletal:     Cervical back: Normal range of motion and neck supple.  Neurological:     Mental Status: She is alert and oriented to person, place, and time.     Assessment and Plan: Steven was seen today for acute visit.  Diagnoses and all orders for this visit:  Left ear pain -     Ambulatory referral to ENT -     cetirizine (ZYRTEC) 10 MG tablet; Take 1 tablet (10 mg total) by mouth daily.   Follow Up Instructions: Complete oral abx and continue flonase as prescribed Add zyrtec F/up with ENT.   I discussed the assessment and treatment plan with the patient. The patient was provided an opportunity to ask questions and all were answered. The patient agreed with the plan and demonstrated an understanding of the instructions.   The patient was advised to call back or seek an in-person evaluation if the symptoms worsen or if the condition fails to improve as anticipated.   Wilfred Lacy, NP

## 2020-11-09 DIAGNOSIS — Z96651 Presence of right artificial knee joint: Secondary | ICD-10-CM | POA: Diagnosis not present

## 2020-11-09 DIAGNOSIS — M1712 Unilateral primary osteoarthritis, left knee: Secondary | ICD-10-CM | POA: Diagnosis not present

## 2020-11-16 ENCOUNTER — Encounter: Payer: Self-pay | Admitting: Family Medicine

## 2020-11-16 ENCOUNTER — Other Ambulatory Visit: Payer: Self-pay

## 2020-11-16 ENCOUNTER — Ambulatory Visit (INDEPENDENT_AMBULATORY_CARE_PROVIDER_SITE_OTHER): Payer: Medicare HMO | Admitting: Family Medicine

## 2020-11-16 VITALS — BP 120/78 | HR 86 | Temp 97.5°F | Ht 65.0 in | Wt 168.0 lb

## 2020-11-16 DIAGNOSIS — I10 Essential (primary) hypertension: Secondary | ICD-10-CM

## 2020-11-16 DIAGNOSIS — M1712 Unilateral primary osteoarthritis, left knee: Secondary | ICD-10-CM

## 2020-11-16 DIAGNOSIS — E782 Mixed hyperlipidemia: Secondary | ICD-10-CM

## 2020-11-16 DIAGNOSIS — Z01818 Encounter for other preprocedural examination: Secondary | ICD-10-CM | POA: Diagnosis not present

## 2020-11-16 NOTE — Progress Notes (Signed)
[name] is a 75 y.o. female  Chief Complaint  Patient presents with  . Pre-op Exam    Patient need surgery clearance for LT knee replacement.      HPI:  [name] is a 75 y.o. female seen today for preoperative evaluation.  She has OA of Lt knee. She takes celebrex 200mg  PRN. She had Rt TKA with Dr. Alvan Dame in 02/2020.  Planned Procedure: Lt TKA  Surgeon: Dr. Lara Mulch  Date of Surgery: not yet scheduled. Will be scheduled once this appt is complete  Recent illness/hospitalization: no  Previous issue with anesthesia: no. Pt reports some nausea post-op years ago but not with recent surgeries  Personal/Family history of clotting disorder: no   Past Medical History:  Diagnosis Date  . Arthritis   . Cancer ([place]) 01/07/2006   Breast cancer- L Breast  . Cataract   . Complication of anesthesia   . Hypertension   . Injury of ligament of hand    right thumb ulnar collateral ligament injury.  recommend MR arthrogram of the thumb.  Marland Kitchen Neuromuscular disorder ([place])    scoliosis s/p spine surgery, has arthritis and pinched nerves  . PONV (postoperative nausea and vomiting)     Past Surgical History:  Procedure Laterality Date  . ABDOMINAL HYSTERECTOMY  2002   have ovaries  . BREAST SURGERY  2006   left, cancer  . EYE SURGERY  2012  . [place] and 2007  . TOTAL KNEE ARTHROPLASTY Right 03/07/2020   Procedure: TOTAL KNEE ARTHROPLASTY;  Surgeon: Paralee Cancel, MD;  Location: [facility];  Service: Orthopedics;  Laterality: Right;  70 mins  . TUBAL LIGATION      Social History   Socioeconomic History  . Marital status: Married    Spouse name: Not on file  . Number of children: Not on file  . Years of education: Not on file  . Highest education level: Not on file  Occupational History  . Occupation: Retired  Tobacco Use  . Smoking status: Never Smoker  . Smokeless tobacco: Never Used  Vaping Use  . Vaping Use: Never used  Substance and Sexual Activity   . Alcohol use: No  . Drug use: No  . Sexual activity: Yes  Other Topics Concern  . Not on file  Social History Narrative   Married   Education: [employer]   Exercise: two times a week   Social Determinants of Health   Financial Resource Strain: Low Risk   . Difficulty of Paying Living Expenses: Not hard at all  Food Insecurity: No Food Insecurity  . Worried About Charity fundraiser in the Last Year: Never true  . Ran Out of Food in the Last Year: Never true  Transportation Needs: No Transportation Needs  . Lack of Transportation (Medical): No  . Lack of Transportation (Non-Medical): No  Physical Activity: Sufficiently Active  . Days of Exercise per Week: 5 days  . Minutes of Exercise per Session: 40 min  Stress: No Stress Concern Present  . Feeling of Stress : Not at all  Social Connections: Socially Integrated  . Frequency of Communication with Friends and Family: More than three times a week  . Frequency of Social Gatherings with Friends and Family: Once a week  . Attends Religious Services: 1 to 4 times per year  . Active Member of Clubs or Organizations: Yes  . Attends Archivist Meetings: 1 to 4 times per year  .  Marital Status: Married  Human resources officer Violence: Not At Risk  . Fear of Current or Ex-Partner: No  . Emotionally Abused: No  . Physically Abused: No  . Sexually Abused: No    Family History  Problem Relation Age of Onset  . Diabetes Mother        age early 51's  . Heart disease Mother        age early 37's or 16's  . Hypertension Mother   . Hyperlipidemia Mother   . Stroke Father 40       per pt mini  . Diabetes Sister 43       type II  . Hyperlipidemia Sister   . Hypertension Sister   . Kidney disease Brother        polycystic kidney  . Diabetes Brother   . Heart disease Brother   . Alcohol abuse Maternal Grandfather   . Cancer Maternal Grandmother        Leukemia  . Cancer Paternal Aunt 80       Breast     Immunization  History  Administered Date(s) Administered  . Fluad Quad(high Dose 65+) 07/07/2020  . Hepatitis B 09/18/2011, 02/10/2012  . Influenza, High Dose Seasonal PF 07/27/2014, 07/13/2015, 07/30/2016, 07/29/2017, 07/27/2018  . Influenza,inj,Quad PF,6+ Mos 08/02/2019  . Influenza-Unspecified 06/21/2013, 07/22/2015  . Moderna Sars-Covid-2 Vaccination 11/05/2019, 12/06/2019, 09/07/2020  . Pneumococcal Conjugate-13 01/09/2015  . Pneumococcal-Unspecified 08/02/2011  . Tdap 07/22/2011  . Zoster 08/21/2010    Outpatient Encounter Medications as of 11/16/2020  Medication Sig Note  . acetaminophen (TYLENOL) 650 MG CR tablet Tylenol Arthritis Pain 650 mg tablet,extended release   1 tablet as needed by oral route.   Marland Kitchen aspirin 81 MG EC tablet Adult Low Dose Aspirin 81 mg tablet,delayed release   1 tablet every day by oral route.   . celecoxib (CELEBREX) 200 MG capsule Take 1 capsule (200 mg total) by mouth daily. 08/18/2020: Pt takes PRN  . Cholecalciferol (VITAMIN D) 125 MCG (5000 UT) CAPS Take 5,000 Units by mouth daily.    Marland Kitchen lisinopril (ZESTRIL) 10 MG tablet Take 1 tablet (10 mg total) by mouth daily.   . Multiple Vitamin (MULTIVITAMIN) tablet Take 1 tablet by mouth daily.   . Omega-3 Fatty Acids (FISH OIL) 1000 MG CAPS Take 1,000 mg by mouth daily.   Marland Kitchen tiZANidine (ZANAFLEX) 4 MG tablet    . [DISCONTINUED] cetirizine (ZYRTEC) 10 MG tablet Take 1 tablet (10 mg total) by mouth daily.   . [DISCONTINUED] fluticasone (FLONASE) 50 MCG/ACT nasal spray Place into both nostrils daily.   . [DISCONTINUED] methocarbamol (ROBAXIN) 500 MG tablet Take 1 tablet (500 mg total) by mouth daily as needed for muscle spasms. 08/18/2020: Pt takes PRN   No facility-administered encounter medications on file as of 11/16/2020.     ROS:  Gen: no fever, chills ENT: no ear pain, ear drainage, nasal congestion, rhinorrhea, sinus pressure, sore throat Eyes: no blurry vision, double vision Resp: no cough, wheeze,SOB CV: no CP,  palpitations, LE edema, PND GI: no heartburn, n/v/d/c, abd pain GU: no dysuria, urgency, frequency, hematuria  MSK: as above in HPI Neuro: no dizziness, headache, weakness Psych: no depression, anxiety, insomnia   Allergies  Allergen Reactions  . Codeine Other (See Comments)    DIZZINESS  . Hydrocodone-Acetaminophen     Hives, itching  . Pravachol [Pravastatin Sodium] Other (See Comments)    Per patient caused headaches and GI upset  . Albuterol Other (See Comments) and Palpitations  Heart race, tremble and studdering Heart race, tremble and studdering    BP 120/78   Pulse 86   Temp (!) 97.5 F (36.4 C) (Temporal)   Ht 5\' 5"  (1.651 m)   Wt 168 lb (76.2 kg)   SpO2 99%   BMI 27.96 kg/m    BP Readings from Last 3 Encounters:  11/16/20 120/78  07/07/20 (!) 144/90  03/08/20 140/74   Pulse Readings from Last 3 Encounters:  11/16/20 86  07/07/20 87  03/08/20 78   Wt Readings from Last 3 Encounters:  11/16/20 168 lb (76.2 kg)  08/18/20 170 lb (77.1 kg)  07/25/20 169 lb (76.7 kg)     Physical Exam Constitutional:      General: She is not in acute distress.    Appearance: Normal appearance. She is not ill-appearing.  HENT:     Head: Normocephalic and atraumatic.     Right Ear: External ear normal.     Left Ear: External ear normal.     Mouth/Throat:     Mouth: Oropharynx is clear and moist.     Pharynx: No oropharyngeal exudate.  Eyes:     General:        Right eye: No discharge.        Left eye: No discharge.     Conjunctiva/sclera: Conjunctivae normal.  Neck:     Thyroid: No thyromegaly.     Vascular: No JVD.  Cardiovascular:     Rate and Rhythm: Normal rate and regular rhythm.     Pulses: Normal pulses.  Pulmonary:     Effort: Pulmonary effort is normal.     Breath sounds: Normal breath sounds. No wheezing or rhonchi.  Abdominal:     General: Bowel sounds are normal.     Palpations: Abdomen is soft. There is no mass.     Tenderness: There is no  abdominal tenderness.  Musculoskeletal:        General: No edema.     Cervical back: Normal range of motion and neck supple.  Lymphadenopathy:     Cervical: No cervical adenopathy.  Skin:    General: Skin is warm and dry.     Findings: No erythema or rash.  Neurological:     Mental Status: She is alert and oriented to person, place, and time.     Gait: Gait is intact.     Deep Tendon Reflexes: Reflexes normal.  Psychiatric:        Mood and Affect: Mood and affect normal.        Behavior: Behavior normal.        Cognition and Memory: Memory normal.    Lab Results  Component Value Date   WBC 11.6 (H) 03/08/2020   HGB 10.8 (L) 03/08/2020   HCT 33.4 (L) 03/08/2020   MCV 96.8 03/08/2020   PLT 197 03/08/2020   Lab Results  Component Value Date   NA 139 07/07/2020   K 4.2 07/07/2020   CREATININE 0.93 07/07/2020   GFRNONAA 59 (L) 03/08/2020   GFRAA >60 03/08/2020   GLUCOSE 100 (H) 07/07/2020   Lab Results  Component Value Date   CHOL 245 (H) 07/07/2020   HDL 42.80 07/07/2020   LDLCALC 173 (H) 07/07/2020   TRIG 148.0 07/07/2020   CHOLHDL 6 07/07/2020     A/P:   1. Primary hypertension - controlled, at goal - cont lisinopril 10mg  daily  2. Mixed hyperlipidemia - on fish oil 1,000mg  daily but no Rx med (was not able  to tolerate statin)  3. Preop examination 4. Primary osteoarthritis of left knee - pt is acceptable risk for planned surgical procedure - form completed and will fax along with office note to Dr. Ronnie Derby [phone]  This visit occurred during the SARS-CoV-2 public health emergency.  Safety protocols were in place, including screening questions prior to the visit, additional usage of staff PPE, and extensive cleaning of exam room while observing appropriate contact time as indicated for disinfecting solutions.

## 2020-11-28 ENCOUNTER — Telehealth: Payer: Self-pay | Admitting: Family Medicine

## 2020-11-28 DIAGNOSIS — Z01818 Encounter for other preprocedural examination: Secondary | ICD-10-CM

## 2020-11-28 DIAGNOSIS — M1712 Unilateral primary osteoarthritis, left knee: Secondary | ICD-10-CM

## 2020-11-28 NOTE — Telephone Encounter (Signed)
Pt dropped off note for Dr Bryan Lemma, I put on her desk for when she comes back 11/29/20

## 2020-11-28 NOTE — Telephone Encounter (Signed)
Patient wants to get her pre-op labs done here that were given to her after the PR-op appt. Please review and order them so she can get them done here.  Paper is on your desk . Thanks.  Dm/cma

## 2020-11-29 NOTE — Telephone Encounter (Signed)
Patient scheduled for lab appt on 2/23 @ 9:00 am.  Dm/cma

## 2020-11-29 NOTE — Telephone Encounter (Signed)
Lab orders placed. Please schedule pt for lab appt on 2/23 or 2/24. pts Rx w/ needed labs returned to your desk

## 2020-12-12 ENCOUNTER — Other Ambulatory Visit: Payer: Self-pay

## 2020-12-13 ENCOUNTER — Other Ambulatory Visit (INDEPENDENT_AMBULATORY_CARE_PROVIDER_SITE_OTHER): Payer: Medicare HMO

## 2020-12-13 DIAGNOSIS — M1712 Unilateral primary osteoarthritis, left knee: Secondary | ICD-10-CM

## 2020-12-13 DIAGNOSIS — Z01818 Encounter for other preprocedural examination: Secondary | ICD-10-CM

## 2020-12-13 LAB — COMPREHENSIVE METABOLIC PANEL
ALT: 14 U/L (ref 0–35)
AST: 14 U/L (ref 0–37)
Albumin: 4.2 g/dL (ref 3.5–5.2)
Alkaline Phosphatase: 49 U/L (ref 39–117)
BUN: 21 mg/dL (ref 6–23)
CO2: 25 mEq/L (ref 19–32)
Calcium: 9.5 mg/dL (ref 8.4–10.5)
Chloride: 106 mEq/L (ref 96–112)
Creatinine, Ser: 0.93 mg/dL (ref 0.40–1.20)
GFR: 60.48 mL/min (ref >60.00)
Glucose, Bld: 93 mg/dL (ref 70–99)
Potassium: 4.5 mEq/L (ref 3.5–5.1)
Sodium: 139 mEq/L (ref 135–145)
Total Bilirubin: 0.5 mg/dL (ref 0.2–1.2)
Total Protein: 6.8 g/dL (ref 6.0–8.3)

## 2020-12-13 LAB — CBC
HCT: 38.2 % (ref 36.0–46.0)
Hemoglobin: 12.7 g/dL (ref 12.0–15.0)
MCHC: 33.2 g/dL (ref 30.0–36.0)
MCV: 91.2 fl (ref 78.0–100.0)
Platelets: 210 10*3/uL (ref 150.0–400.0)
RBC: 4.19 Mil/uL (ref 3.87–5.11)
RDW: 13 % (ref 11.5–15.5)
WBC: 5.7 10*3/uL (ref 4.0–10.5)

## 2020-12-13 LAB — HEMOGLOBIN A1C: Hgb A1c MFr Bld: 5.8 % (ref 4.6–6.5)

## 2020-12-13 NOTE — Progress Notes (Signed)
Per orders of Dr. Loletha Grayer pt is here for lab draw pt tolerated draw well.

## 2021-01-15 DIAGNOSIS — Z96653 Presence of artificial knee joint, bilateral: Secondary | ICD-10-CM | POA: Insufficient documentation

## 2021-01-15 DIAGNOSIS — M1712 Unilateral primary osteoarthritis, left knee: Secondary | ICD-10-CM | POA: Diagnosis not present

## 2021-01-15 DIAGNOSIS — G8918 Other acute postprocedural pain: Secondary | ICD-10-CM | POA: Diagnosis not present

## 2021-01-15 DIAGNOSIS — Z96652 Presence of left artificial knee joint: Secondary | ICD-10-CM | POA: Diagnosis not present

## 2021-01-19 DIAGNOSIS — M1712 Unilateral primary osteoarthritis, left knee: Secondary | ICD-10-CM | POA: Diagnosis not present

## 2021-01-19 DIAGNOSIS — Z96652 Presence of left artificial knee joint: Secondary | ICD-10-CM | POA: Diagnosis not present

## 2021-01-22 DIAGNOSIS — M25562 Pain in left knee: Secondary | ICD-10-CM | POA: Diagnosis not present

## 2021-01-22 DIAGNOSIS — M1712 Unilateral primary osteoarthritis, left knee: Secondary | ICD-10-CM | POA: Diagnosis not present

## 2021-01-24 DIAGNOSIS — M25562 Pain in left knee: Secondary | ICD-10-CM | POA: Diagnosis not present

## 2021-01-24 DIAGNOSIS — M1712 Unilateral primary osteoarthritis, left knee: Secondary | ICD-10-CM | POA: Diagnosis not present

## 2021-01-25 DIAGNOSIS — Z96652 Presence of left artificial knee joint: Secondary | ICD-10-CM | POA: Diagnosis not present

## 2021-02-01 DIAGNOSIS — M1712 Unilateral primary osteoarthritis, left knee: Secondary | ICD-10-CM | POA: Diagnosis not present

## 2021-02-01 DIAGNOSIS — M25562 Pain in left knee: Secondary | ICD-10-CM | POA: Diagnosis not present

## 2021-02-02 DIAGNOSIS — M1712 Unilateral primary osteoarthritis, left knee: Secondary | ICD-10-CM | POA: Diagnosis not present

## 2021-02-02 DIAGNOSIS — Z96652 Presence of left artificial knee joint: Secondary | ICD-10-CM | POA: Diagnosis not present

## 2021-02-06 DIAGNOSIS — M1712 Unilateral primary osteoarthritis, left knee: Secondary | ICD-10-CM | POA: Diagnosis not present

## 2021-02-06 DIAGNOSIS — M25562 Pain in left knee: Secondary | ICD-10-CM | POA: Diagnosis not present

## 2021-02-08 DIAGNOSIS — M1712 Unilateral primary osteoarthritis, left knee: Secondary | ICD-10-CM | POA: Diagnosis not present

## 2021-02-08 DIAGNOSIS — M25562 Pain in left knee: Secondary | ICD-10-CM | POA: Diagnosis not present

## 2021-02-13 DIAGNOSIS — M1712 Unilateral primary osteoarthritis, left knee: Secondary | ICD-10-CM | POA: Diagnosis not present

## 2021-02-13 DIAGNOSIS — M25562 Pain in left knee: Secondary | ICD-10-CM | POA: Diagnosis not present

## 2021-02-15 DIAGNOSIS — M25562 Pain in left knee: Secondary | ICD-10-CM | POA: Diagnosis not present

## 2021-02-15 DIAGNOSIS — M1712 Unilateral primary osteoarthritis, left knee: Secondary | ICD-10-CM | POA: Diagnosis not present

## 2021-02-20 DIAGNOSIS — M25562 Pain in left knee: Secondary | ICD-10-CM | POA: Diagnosis not present

## 2021-02-20 DIAGNOSIS — M1712 Unilateral primary osteoarthritis, left knee: Secondary | ICD-10-CM | POA: Diagnosis not present

## 2021-02-27 DIAGNOSIS — M25562 Pain in left knee: Secondary | ICD-10-CM | POA: Diagnosis not present

## 2021-02-27 DIAGNOSIS — M1712 Unilateral primary osteoarthritis, left knee: Secondary | ICD-10-CM | POA: Diagnosis not present

## 2021-03-01 DIAGNOSIS — M1712 Unilateral primary osteoarthritis, left knee: Secondary | ICD-10-CM | POA: Diagnosis not present

## 2021-03-01 DIAGNOSIS — M25562 Pain in left knee: Secondary | ICD-10-CM | POA: Diagnosis not present

## 2021-06-18 ENCOUNTER — Other Ambulatory Visit: Payer: Self-pay

## 2021-06-18 ENCOUNTER — Ambulatory Visit
Admission: RE | Admit: 2021-06-18 | Discharge: 2021-06-18 | Disposition: A | Discharge status: Home / Self Care | Payer: Medicare HMO | Source: Ambulatory Visit | Attending: Nurse Practitioner | Admitting: Nurse Practitioner

## 2021-06-18 VITALS — BP 169/89 | HR 81 | Temp 98.2°F | Resp 16

## 2021-06-18 DIAGNOSIS — J0110 Acute frontal sinusitis, unspecified: Secondary | ICD-10-CM | POA: Diagnosis not present

## 2021-06-18 DIAGNOSIS — J029 Acute pharyngitis, unspecified: Secondary | ICD-10-CM | POA: Insufficient documentation

## 2021-06-18 DIAGNOSIS — Z20822 Contact with and (suspected) exposure to covid-19: Secondary | ICD-10-CM | POA: Diagnosis not present

## 2021-06-18 LAB — POCT RAPID STREP A (OFFICE): Rapid Strep A Screen: NEGATIVE

## 2021-06-18 MED ORDER — AMOXICILLIN-POT CLAVULANATE 875-125 MG PO TABS: 1.0000 | ORAL_TABLET | Freq: Two times a day (BID) | ORAL | 0 refills | Status: DC

## 2021-06-18 MED ORDER — METHYLPREDNISOLONE 4 MG PO TBPK: ORAL_TABLET | ORAL | 0 refills | Status: DC

## 2021-06-18 NOTE — ED Triage Notes (Addendum)
Sore throat, nasal congestion x 1 week, c/o moderate headache. Denies cough, fever. Negative covid test at home

## 2021-06-18 NOTE — ED Provider Notes (Signed)
[facility]    CSN: [id number] Arrival date & time: 06/18/21  [id number]      History   Chief Complaint Chief Complaint  Patient presents with   Sore Throat    HPI [name] is a 75 y.o. female.   Subjective:   [name] is a 75 y.o. female who presents for evaluation of possible sinus infection. Symptoms include bilateral ear pressure/pain, congestion, sore throat, headache and sinus pressure with no fever, chills, night sweats or weight loss. No significant cough, shortness of breath, nausea, vomiting or diarrhea.  Onset of symptoms was 5-6 weeks ago and has been unchanged since that time. She has tried OTC agents without much relief in symptoms. She is drinking plenty of fluids.  No history of pneumonia or bronchitis. Patient is a non-smoker. She has had her COVID vaccine + booster x 1. She took a home COVID test recently which was negative.   The following portions of the patient's history were reviewed and updated as appropriate: allergies, current medications, past family history, past medical history, past social history, past surgical history, and problem list.    Past Medical History:  Diagnosis Date   Arthritis    Cancer ([place]) 01/07/2006   Breast cancer- L Breast   Cataract    Complication of anesthesia    Hypertension    Injury of ligament of hand    right thumb ulnar collateral ligament injury.  recommend MR arthrogram of the thumb.   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      Home Medications    Prior to Admission medications   Medication Sig Start Date End Date Taking? Authorizing Provider  amoxicillin-clavulanate (AUGMENTIN) 875-125 MG tablet Take 1 tablet by mouth every 12 (twelve) hours. 06/18/21  Yes Enrique Sack, FNP  methylPREDNISolone (MEDROL DOSEPAK) 4 MG TBPK tablet Take as directed 06/18/21  Yes Enrique Sack, FNP  acetaminophen (TYLENOL) 650 MG CR tablet Tylenol Arthritis Pain 650 mg tablet,extended release   1 tablet as needed by oral route.    [provider]  aspirin 81 MG EC tablet Adult Low Dose Aspirin 81 mg tablet,delayed release   1 tablet every day by oral route.    [provider]  celecoxib (CELEBREX) 200 MG capsule Take 1 capsule (200 mg total) by mouth daily. 07/07/20   Cirigliano, Garvin Fila, DO  Cholecalciferol (VITAMIN D) 125 MCG (5000 UT) CAPS Take 5,000 Units by mouth daily.  [provider]  lisinopril (ZESTRIL) 10 MG tablet Take 1 tablet (10 mg total) by mouth daily. 07/07/20   CiriglianoGarvin Fila, DO  Multiple Vitamin (MULTIVITAMIN) tablet Take 1 tablet by mouth daily.    [provider]  Omega-3 Fatty Acids (FISH OIL) 1000 MG CAPS Take 1,000 mg by mouth daily.    [provider]  tiZANidine (ZANAFLEX) 4 MG tablet  10/19/20   [provider]    Family History Family History  Problem Relation Age of Onset   Diabetes Mother        age early 40's   Heart disease Mother        age early 67's or 35's   Hypertension  Mother    Hyperlipidemia Mother    Stroke Father 15       per pt mini   Diabetes Sister 19       type II   Hyperlipidemia Sister    Hypertension Sister    Kidney disease Brother        polycystic kidney   Diabetes Brother    Heart disease Brother    Alcohol abuse Maternal Grandfather    Cancer Maternal Grandmother        Leukemia   Cancer Paternal Aunt 58       Breast    Social History Social History   Tobacco Use   Smoking status: Never   Smokeless tobacco: Never  Vaping Use   Vaping Use: Never used  Substance Use Topics   Alcohol use: No   Drug use: No     Allergies   Codeine, Hydrocodone-acetaminophen, Pravachol [pravastatin sodium], and Albuterol   Review of Systems Review of Systems  Constitutional:  Positive for fatigue. Negative for activity change, appetite change, chills and fever.  HENT:  Positive for congestion, ear pain, sinus pressure, sinus pain and sore throat.   Respiratory:  Positive for cough. Negative for shortness of breath.   Cardiovascular:  Negative for chest pain.  Gastrointestinal:  Negative for diarrhea, nausea and vomiting.  Musculoskeletal:  Negative for myalgias.  Neurological:  Positive for headaches.  All other systems reviewed and are negative.   Physical Exam Triage Vital Signs ED Triage Vitals [06/18/21 1102]  Enc Vitals Group     BP (!) 169/89     Pulse Rate 81     Resp 16     Temp 98.2 F (36.8 C)     Temp Source Oral     SpO2 96 %     Weight      Height      Head Circumference      Peak Flow      Pain Score 10     Pain Loc      Pain Edu?      Excl. in [place]?    No data found.  Updated Vital Signs BP (!) 169/89 (BP Location: Right Arm)   Pulse 81   Temp 98.2 F (36.8 C) (Oral)   Resp 16   SpO2 96%   Visual Acuity Right Eye Distance:   Left Eye Distance:   Bilateral Distance:    Right Eye Near:   Left Eye Near:    Bilateral Near:     Physical Exam Vitals reviewed.  Constitutional:      General:  She is not in acute distress.    Appearance: She is well-developed. She is not ill-appearing, toxic-appearing or diaphoretic.  HENT:     Head: Normocephalic.  Right Ear: Tympanic membrane and ear canal normal.     Left Ear: Tympanic membrane and ear canal normal.     Nose: Congestion present.     Mouth/Throat:     Mouth: Mucous membranes are moist. No oral lesions.     Pharynx: Oropharynx is clear. Posterior oropharyngeal erythema present. No pharyngeal swelling, oropharyngeal exudate or uvula swelling.     Tonsils: No tonsillar exudate or tonsillar abscesses.  Eyes:     Conjunctiva/sclera: Conjunctivae normal.     Pupils: Pupils are equal, round, and reactive to light.  Cardiovascular:     Rate and Rhythm: Normal rate.     Heart sounds: Normal heart sounds.  Pulmonary:     Effort: Pulmonary effort is normal.  Abdominal:     Palpations: Abdomen is soft.  Musculoskeletal:     Cervical back: Normal range of motion and neck supple.  Lymphadenopathy:     Cervical: No cervical adenopathy.  Skin:    General: Skin is warm and dry.  Neurological:     General: No focal deficit present.     Mental Status: She is alert and oriented to person, place, and time.  Psychiatric:        Mood and Affect: Mood normal.        Behavior: Behavior normal.     UC Treatments / Results  Labs (all labs ordered are listed, but only abnormal results are displayed) Labs Reviewed  NOVEL CORONAVIRUS, NAA  CULTURE, GROUP A STREP [facility])  POCT RAPID STREP A (OFFICE)    EKG   Radiology No results found.  Procedures Procedures (including critical care time)  Medications Ordered in UC Medications - No data to display  Initial Impression / Assessment and Plan / UC Course  I have reviewed the triage vital signs and the nursing notes.  Pertinent labs & imaging results that were available during my care of the patient were reviewed by me and considered in my medical decision making (see chart for  details).     75 year old female presenting with bilateral ear pressure, congestion, sore throat, headache and sinus pressure with no fever, chills, night sweats, weight loss, significant cough, shortness of breath, nausea, vomiting or diarrhea. he has tried OTC agents without much relief in symptoms. She is drinking plenty of fluids.  No history of pneumonia or bronchitis. Patient is a non-smoker. She has had her COVID vaccine + booster x 1. She took a home COVID test recently which was negative.  Rapid strep negative. COVID test and throat cultures pending.   Plan:  1. Sudafed OTC  2. Medrol dose pack  3. Augmentin 4. Nasal saline rinses as needed for congestion. 5. Follow-up with PCP in 1 week if symptoms worsen or persist.   Today's evaluation has revealed no signs of a dangerous process. Discussed diagnosis with patient and/or guardian. Patient and/or guardian aware of their diagnosis, possible red flag symptoms to watch out for and need for close follow up. Patient and/or guardian understands verbal and written discharge instructions. Patient and/or guardian comfortable with plan and disposition.  Patient and/or guardian has a clear mental status at this time, good insight into illness (after discussion and teaching) and has clear judgment to make decisions regarding their care  This care was provided during an unprecedented National Emergency due to the Novel Coronavirus (COVID-19) pandemic. COVID-19 infections and transmission risks place heavy strains on healthcare resources.  As this pandemic evolves, our facility, providers, and staff strive to respond fluidly,  to remain operational, and to provide care relative to available resources and information. Outcomes are unpredictable and treatments are without well-defined guidelines. Further, the impact of COVID-19 on all aspects of urgent care, including the impact to patients seeking care for reasons other than COVID-19, is unavoidable  during this national emergency. At this time of the global pandemic, management of patients has significantly changed, even for non-COVID positive patients given high local and regional COVID volumes at this time requiring high healthcare system and resource utilization. The standard of care for management of both COVID suspected and non-COVID suspected patients continues to change rapidly at the local, regional, national, and global levels. This patient was worked up and treated to the best available but ever changing evidence and resources available at this current time.   Documentation was completed with the aid of voice recognition software. Transcription may contain typographical errors.  Final Clinical Impressions(s) / UC Diagnoses   Final diagnoses:  Encounter for screening laboratory testing for COVID-19 virus  Sore throat  Acute non-recurrent frontal sinusitis     Discharge Instructions      Take medications as prescribed. You may take tylenol or ibuprofen as needed for fevers/headache/body aches. Drink plenty of fluids. Your COVID test is pending. You will only be notified for positive results. You may go online to MyChart in the next few days and review your results.      ED Prescriptions     Medication Sig Dispense Auth. Provider   amoxicillin-clavulanate (AUGMENTIN) 875-125 MG tablet Take 1 tablet by mouth every 12 (twelve) hours. 14 tablet Enrique Sack, FNP   methylPREDNISolone (MEDROL DOSEPAK) 4 MG TBPK tablet Take as directed 21 tablet Enrique Sack, FNP      PDMP not reviewed this encounter.   Enrique Sack, [place] 06/18/21 1219

## 2021-06-18 NOTE — Discharge Instructions (Addendum)
Take medications as prescribed. You may take tylenol or ibuprofen as needed for fevers/headache/body aches. Drink plenty of fluids. Your COVID test is pending. You will only be notified for positive results. You may go online to MyChart in the next few days and review your results.

## 2021-06-19 LAB — SARS-COV-2, NAA 2 DAY TAT

## 2021-06-19 LAB — NOVEL CORONAVIRUS, NAA: SARS-CoV-2, NAA: NOT DETECTED

## 2021-06-21 ENCOUNTER — Telehealth (HOSPITAL_COMMUNITY): Payer: Self-pay | Admitting: Internal Medicine

## 2021-06-21 LAB — CULTURE, GROUP A STREP (THRC)

## 2021-07-12 ENCOUNTER — Encounter: Payer: Self-pay | Admitting: Nurse Practitioner

## 2021-07-12 ENCOUNTER — Ambulatory Visit (INDEPENDENT_AMBULATORY_CARE_PROVIDER_SITE_OTHER): Payer: Medicare HMO | Admitting: Nurse Practitioner

## 2021-07-12 ENCOUNTER — Other Ambulatory Visit: Payer: Self-pay

## 2021-07-12 VITALS — BP 110/56 | HR 74 | Temp 97.7°F | Ht 65.0 in | Wt 167.0 lb

## 2021-07-12 DIAGNOSIS — M8580 Other specified disorders of bone density and structure, unspecified site: Secondary | ICD-10-CM | POA: Insufficient documentation

## 2021-07-12 DIAGNOSIS — R739 Hyperglycemia, unspecified: Secondary | ICD-10-CM

## 2021-07-12 DIAGNOSIS — Z23 Encounter for immunization: Secondary | ICD-10-CM | POA: Diagnosis not present

## 2021-07-12 DIAGNOSIS — M8589 Other specified disorders of bone density and structure, multiple sites: Secondary | ICD-10-CM | POA: Diagnosis not present

## 2021-07-12 DIAGNOSIS — M5416 Radiculopathy, lumbar region: Secondary | ICD-10-CM

## 2021-07-12 DIAGNOSIS — E782 Mixed hyperlipidemia: Secondary | ICD-10-CM

## 2021-07-12 DIAGNOSIS — I10 Essential (primary) hypertension: Secondary | ICD-10-CM

## 2021-07-12 LAB — LIPID PANEL
Cholesterol: 246 mg/dL — ABNORMAL HIGH (ref 0–200)
HDL: 46.4 mg/dL (ref >39.00)
LDL Cholesterol: 166 mg/dL — ABNORMAL HIGH (ref 0–99)
NonHDL: 199.73
Total CHOL/HDL Ratio: 5
Triglycerides: 169 mg/dL — ABNORMAL HIGH (ref 0.0–149.0)
VLDL: 33.8 mg/dL (ref 0.0–40.0)

## 2021-07-12 LAB — CBC
HCT: 38.3 % (ref 36.0–46.0)
Hemoglobin: 12.5 g/dL (ref 12.0–15.0)
MCHC: 32.7 g/dL (ref 30.0–36.0)
MCV: 91.6 fl (ref 78.0–100.0)
Platelets: 212 10*3/uL (ref 150.0–400.0)
RBC: 4.18 Mil/uL (ref 3.87–5.11)
RDW: 13.3 % (ref 11.5–15.5)
WBC: 5 10*3/uL (ref 4.0–10.5)

## 2021-07-12 LAB — COMPREHENSIVE METABOLIC PANEL
ALT: 15 U/L (ref 0–35)
AST: 17 U/L (ref 0–37)
Albumin: 4.4 g/dL (ref 3.5–5.2)
Alkaline Phosphatase: 47 U/L (ref 39–117)
BUN: 13 mg/dL (ref 6–23)
CO2: 22 mEq/L (ref 19–32)
Calcium: 9.6 mg/dL (ref 8.4–10.5)
Chloride: 108 mEq/L (ref 96–112)
Creatinine, Ser: 0.9 mg/dL (ref 0.40–1.20)
GFR: 62.65 mL/min (ref >60.00)
Glucose, Bld: 97 mg/dL (ref 70–99)
Potassium: 4.1 mEq/L (ref 3.5–5.1)
Sodium: 140 mEq/L (ref 135–145)
Total Bilirubin: 0.8 mg/dL (ref 0.2–1.2)
Total Protein: 7 g/dL (ref 6.0–8.3)

## 2021-07-12 LAB — HEMOGLOBIN A1C: Hgb A1c MFr Bld: 6 % (ref 4.6–6.5)

## 2021-07-12 LAB — TSH: TSH: 1.12 u[IU]/mL (ref 0.35–5.50)

## 2021-07-12 MED ORDER — MELOXICAM 15 MG PO TABS: 7.5000 mg | ORAL_TABLET | Freq: Every day | ORAL | 5 refills | Status: DC | PRN

## 2021-07-12 NOTE — Assessment & Plan Note (Addendum)
Last dexa scan 2021 Advised to maintain daily calcium and vitamin D supplement

## 2021-07-12 NOTE — Patient Instructions (Addendum)
Please sign medical release form to get records from previous GI.  Go to lab for blood draw.  Monitor BP at home in AM Hold lisinopril if BP <120/70 Send BP readings via mychart in 1week.  Preventive Care 75 Years and Older, Female Preventive care refers to lifestyle choices and visits with your health care provider that can promote health and wellness. This includes: A yearly physical exam. This is also called an annual wellness visit. Regular dental and eye exams. Immunizations. Screening for certain conditions. Healthy lifestyle choices, such as: Eating a healthy diet. Getting regular exercise. Not using drugs or products that contain nicotine and tobacco. Limiting alcohol use. What can I expect for my preventive care visit? Physical exam Your health care provider will check your: Height and weight. These may be used to calculate your BMI (body mass index). BMI is a measurement that tells if you are at a healthy weight. Heart rate and blood pressure. Body temperature. Skin for abnormal spots. Counseling Your health care provider may ask you questions about your: Past medical problems. Family's medical history. Alcohol, tobacco, and drug use. Emotional well-being. Home life and relationship well-being. Sexual activity. Diet, exercise, and sleep habits. History of falls. Memory and ability to understand (cognition). Work and work Statistician. Pregnancy and menstrual history. Access to firearms. What immunizations do I need? Vaccines are usually given at various ages, according to a schedule. Your health care provider will recommend vaccines for you based on your age, medical history, and lifestyle or other factors, such as travel or where you work. What tests do I need? Blood tests Lipid and cholesterol levels. These may be checked every 5 years, or more often depending on your overall health. Hepatitis C test. Hepatitis B test. Screening Lung cancer screening. You  may have this screening every year starting at age 75 if you have a 30-pack-year history of smoking and currently smoke or have quit within the past 15 years. Colorectal cancer screening. All adults should have this screening starting at age 75 and continuing until age 78. Your health care provider may recommend screening at age 30 if you are at increased risk. You will have tests every 1-10 years, depending on your results and the type of screening test. Diabetes screening. This is done by checking your blood sugar (glucose) after you have not eaten for a while (fasting). You may have this done every 1-3 years. Mammogram. This may be done every 1-2 years. Talk with your health care provider about how often you should have regular mammograms. Abdominal aortic aneurysm (AAA) screening. You may need this if you are a current or former smoker. 75 BRCA-related cancer screening. This may be done if you have a family history of breast, ovarian, tubal, or peritoneal cancers. Other tests STD (sexually transmitted disease) testing, if you are at risk. Bone density scan. This is done to screen for osteoporosis. You may have this done starting at age 75. Talk with your health care provider about your test results, treatment options, and if necessary, the need for more tests. Follow these instructions at home: Eating and drinking  Eat a diet that includes fresh fruits and vegetables, whole grains, lean protein, and low-fat dairy products. Limit your intake of foods with high amounts of sugar, saturated fats, and salt. Take vitamin and mineral supplements as recommended by your health care provider. Do not drink alcohol if your health care provider tells you not to drink. If you drink alcohol: Limit how much you have to  0-1 drink a day. Be aware of how much alcohol is in your drink. In the [place] one drink equals one 12 oz bottle of beer (355 mL), one 5 oz glass of wine (148 mL), or one 1 oz glass of hard  liquor (44 mL). Lifestyle Take daily care of your teeth and gums. Brush your teeth every morning and night with fluoride toothpaste. Floss one time each day. Stay active. Exercise for at least 30 minutes 5 or more days each week. Do not use any products that contain nicotine or tobacco, such as cigarettes, e-cigarettes, and chewing tobacco. If you need help quitting, ask your health care provider. Do not use drugs. If you are sexually active, practice safe sex. Use a condom or other form of protection in order to prevent STIs (sexually transmitted infections). Talk with your health care provider about taking a low-dose aspirin or statin. Find healthy ways to cope with stress, such as: Meditation, yoga, or listening to music. Journaling. Talking to a trusted person. Spending time with friends and family. Safety Always wear your seat belt while driving or riding in a vehicle. Do not drive: If you have been drinking alcohol. Do not ride with someone who has been drinking. When you are tired or distracted. While texting. Wear a helmet and other protective equipment during sports activities. If you have firearms in your house, make sure you follow all gun safety procedures. What's next? Visit your health care provider once a year for an annual wellness visit. Ask your health care provider how often you should have your eyes and teeth checked. Stay up to date on all vaccines. This information is not intended to replace advice given to you by your health care provider. Make sure you discuss any questions you have with your health care provider. Document Revised: 12/15/2020 Document Reviewed: 10/01/2018 Elsevier Patient Education  2022 [employer].

## 2021-07-12 NOTE — Progress Notes (Signed)
Subjective:    Patient ID: [name], female    DOB: [birth year], 75 y.o.   MRN: [id number]  Patient presents today for wellness check and eval of chronic conditions.  HPI Osteopenia Last dexa scan 2021 Advised to maintain daily calcium and vitamin D supplement  Hypertensive disorder BP at goal with lisinopril BP Readings from Last 3 Encounters:  07/12/21 (!) 110/56  06/18/21 (!) 169/89  11/16/20 120/78   Maintain current dose Monitor BP at home in AM Hold lisinopril if BP <120/70 Send BP readings via mychart in 1week. F/p in 68months  Hyperlipidemia Repeat lipid panel  Hyperglycemia Repeat hgbA1c  Lumbar radiculopathy, chronic Stable with meloxicam Repeat BMP Unable to tolerate any statin, has tried 3different (elevated LFT, tremor and anxiety) Has maintain DASH diet  BP Readings from Last 3 Encounters:  07/12/21 (!) 110/56  06/18/21 (!) 169/89  11/16/20 120/78    Vision:up to date Dental:up to date Diet: Exercise:limited to recent knee surgery, currently able to do walking and gardening Weight:  Wt Readings from Last 3 Encounters:  07/12/21 167 lb (75.8 kg)  11/16/20 168 lb (76.2 kg)  08/18/20 170 lb (77.1 kg)   Sexual History (orientation,birth control, marital status, STD):mammogram scheduled for 07/25/2021.  Depression/Suicide: Depression screen [facility] 2/9 07/12/2021 07/25/2020 02/28/2020 11/04/2019 06/10/2019 05/26/2019 04/02/2018  Decreased Interest 0 0 0 0 0 0 0  Down, Depressed, Hopeless 0 0 0 0 0 0 0  PHQ - 2 Score 0 0 0 0 0 0 0  Altered sleeping 1 - - - - - -  Tired, decreased energy 1 - - - - - -  Change in appetite 0 - - - - - -  Feeling Hogan or failure about yourself  0 - - - - - -  Trouble concentrating 0 - - - - - -  Moving slowly or fidgety/restless 0 - - - - - -  Suicidal thoughts 0 - - - - - -  PHQ-9 Score 2 - - - - - -  Difficult doing work/chores Not difficult at all - - - - - -   Immunizations: (TDAP, Hep C screen, Pneumovax, Influenza,  zoster)  Health Maintenance  Topic Date Due   Zoster (Shingles) Vaccine (1 of 2) Never done   Tetanus Vaccine  07/21/2021   Colon Cancer Screening  10/21/2024   Flu Shot  Completed   DEXA scan (bone density measurement)  Completed   COVID-19 Vaccine  Completed   Hepatitis C Screening: USPSTF Recommendation to screen - Ages 28-79 yo.  Completed   HPV Vaccine  Aged Out   Fall Risk: Fall Risk  07/12/2021 07/25/2020 02/28/2020 11/04/2019 06/10/2019 05/26/2019 04/02/2018  Falls in the past year? 0 1 1 0 0 0 No  Number falls in past yr: 0 0 0 - - 0 -  Injury with Fall? 0 1 1 0 - 0 -  Comment - - fell on already Hogan right knee - - - -  Risk for fall due to : History of fall(s);Orthopedic patient History of fall(s) - - - - -  Follow up Falls evaluation completed;Education provided;Falls prevention discussed Falls prevention discussed Falls evaluation completed Falls evaluation completed Falls evaluation completed Falls evaluation completed -   Advanced Directive: Advanced Directives 07/25/2020  Does Patient Have a Medical Advance Directive? Yes  Type of Paramedic of [place];Living will  Does patient want to make changes to medical advance directive? -  Copy of Healthcare Power of  Attorney in Chart? No - copy requested    Medications and allergies reviewed with patient and updated if appropriate.  Patient Active Problem List   Diagnosis Date Noted   Hyperglycemia 07/15/2021   Osteopenia 07/12/2021   History of total knee arthroplasty, bilateral 01/15/2021   Overweight (BMI 25.0-29.9) 03/08/2020   History of total knee arthroplasty 03/07/2020   Hyperlipidemia 03/06/2017   History of malignant neoplasm of breast 12/27/2013   Disorder of wrist joint 02/02/2013   Hypertensive disorder 09/25/2012   Lumbar radiculopathy, chronic 09/25/2012   Cervical radiculopathy 09/25/2012    Current Outpatient Medications on File Prior to Visit  Medication Sig Dispense Refill    acetaminophen (TYLENOL) 650 MG CR tablet Tylenol Arthritis Pain 650 mg tablet,extended release   1 tablet as needed by oral route.     aspirin 81 MG EC tablet Adult Low Dose Aspirin 81 mg tablet,delayed release   1 tablet every day by oral route.     Cholecalciferol (VITAMIN D) 125 MCG (5000 UT) CAPS Take 5,000 Units by mouth daily.      lisinopril (ZESTRIL) 10 MG tablet Take 1 tablet (10 mg total) by mouth daily. 90 tablet 3   Multiple Vitamin (MULTIVITAMIN) tablet Take 1 tablet by mouth daily.     Omega-3 Fatty Acids (FISH OIL) 1000 MG CAPS Take 1,000 mg by mouth daily.     tiZANidine (ZANAFLEX) 4 MG tablet      No current facility-administered medications on file prior to visit.    Past Medical History:  Diagnosis Date   Arthritis    Cancer ([place]) 01/07/2006   Breast cancer- L Breast   Cataract    Complication of anesthesia    Hypertension    Injury of ligament of hand    right thumb ulnar collateral ligament injury.  recommend MR arthrogram of the thumb.   Neuromuscular disorder ([place])    scoliosis s/p spine surgery, has arthritis and pinched nerves   PONV (postoperative nausea and vomiting)     Past Surgical History:  Procedure Laterality Date   ABDOMINAL HYSTERECTOMY  2002   have ovaries   BREAST SURGERY  2006   left, cancer   EYE SURGERY  2012   KNEE SURGERY Left    SPINE SURGERY  1981 and 2007   TOTAL KNEE ARTHROPLASTY Right 03/07/2020   Procedure: TOTAL KNEE ARTHROPLASTY;  Surgeon: Paralee Cancel, MD;  Location: [facility];  Service: Orthopedics;  Laterality: Right;  70 mins   TUBAL LIGATION      Social History   Socioeconomic History   Marital status: Married    Spouse name: Not on file   Number of children: Not on file   Years of education: Not on file   Highest education level: Not on file  Occupational History   Occupation: Retired  Tobacco Use   Smoking status: Never   Smokeless tobacco: Never  Vaping Use   Vaping Use: Never used  Substance and Sexual  Activity   Alcohol use: No   Drug use: No   Sexual activity: Yes  Other Topics Concern   Not on file  Social History Narrative   Married   Education: High School   Exercise: two times a week   Social Determinants of Radio broadcast assistant Strain: Low Risk    Difficulty of Paying Living Expenses: Not hard at all  Food Insecurity: No Food Insecurity   Worried About Charity fundraiser in the Last Year: Never true  Ran Out of Food in the Last Year: Never true  Transportation Needs: No Transportation Needs   Lack of Transportation (Medical): No   Lack of Transportation (Non-Medical): No  Physical Activity: Sufficiently Active   Days of Exercise per Week: 5 days   Minutes of Exercise per Session: 40 min  Stress: No Stress Concern Present   Feeling of Stress : Not at all  Social Connections: Socially Integrated   Frequency of Communication with Friends and Family: More than three times a week   Frequency of Social Gatherings with Friends and Family: Once a week   Attends Religious Services: 1 to 4 times per year   Active Member of [employer] or Organizations: Yes   Attends Archivist Meetings: 1 to 4 times per year   Marital Status: Married    Family History  Problem Relation Age of Onset   Diabetes Mother        age early 37's   Heart disease Mother        age early 3's or 68's   Hypertension Mother    Hyperlipidemia Mother    Stroke Father 19       per pt mini   Diabetes Sister 71       type II   Hyperlipidemia Sister    Hypertension Sister    Kidney disease Brother        polycystic kidney   Diabetes Brother    Heart disease Brother    Alcohol abuse Maternal Grandfather    Cancer Maternal Grandmother        Leukemia   Cancer Paternal Aunt 58       Breast       Review of Systems  Constitutional:  Negative for fever, malaise/fatigue and weight loss.  HENT:  Negative for congestion and sore throat.   Eyes:        Negative for visual changes   Respiratory:  Negative for cough and shortness of breath.   Cardiovascular:  Negative for chest pain, palpitations and leg swelling.  Gastrointestinal:  Negative for blood in stool, constipation, diarrhea and heartburn.  Genitourinary:  Negative for dysuria, frequency and urgency.  Musculoskeletal:  Negative for falls, joint pain and myalgias.  Skin:  Negative for rash.  Neurological:  Negative for dizziness, sensory change and headaches.  Endo/Heme/Allergies:  Does not bruise/bleed easily.  Psychiatric/Behavioral:  Negative for depression, substance abuse and suicidal ideas. The patient is not nervous/anxious.    Objective:   Vitals:   07/12/21 0834  BP: (!) 110/56  Pulse: 74  Temp: 97.7 F (36.5 C)  SpO2: 97%    Body mass index is 27.79 kg/m.   Physical Examination:  Physical Exam Vitals reviewed.  Constitutional:      Appearance: She is obese.  HENT:     Right Ear: Tympanic membrane, ear canal and external ear normal.     Left Ear: Tympanic membrane, ear canal and external ear normal.  Cardiovascular:     Rate and Rhythm: Normal rate and regular rhythm.     Pulses: Normal pulses.     Heart sounds: Normal heart sounds.  Pulmonary:     Effort: Pulmonary effort is normal.  Abdominal:     General: Bowel sounds are normal.     Palpations: Abdomen is soft.  Musculoskeletal:        General: Normal range of motion.     Cervical back: Normal range of motion and neck supple.     Lumbar back:  Normal.     Right hip: Normal.     Left hip: Normal.     Right upper leg: Normal.     Left upper leg: Normal.     Right lower leg: Normal. No edema.     Left lower leg: Normal. No edema.  Skin:    General: Skin is warm and dry.  Neurological:     Mental Status: She is alert and oriented to person, place, and time.  Psychiatric:        Mood and Affect: Mood normal.        Behavior: Behavior normal.        Thought Content: Thought content normal.    ASSESSMENT and  PLAN: This visit occurred during the SARS-CoV-2 public health emergency.  Safety protocols were in place, including screening questions prior to the visit, additional usage of staff PPE, and extensive cleaning of exam room while observing appropriate contact time as indicated for disinfecting solutions.   Marc was seen today for transitions of care.  Diagnoses and all orders for this visit:  Primary hypertension -     CBC -     Comprehensive metabolic panel  Osteopenia of multiple sites -     meloxicam (MOBIC) 15 MG tablet; Take 0.5-1 tablets (7.5-15 mg total) by mouth daily as needed for pain. With food  Mixed hyperlipidemia -     Comprehensive metabolic panel -     TSH -     Lipid panel  Lumbar radiculopathy, chronic -     meloxicam (MOBIC) 15 MG tablet; Take 0.5-1 tablets (7.5-15 mg total) by mouth daily as needed for pain. With food  Hyperglycemia -     Hemoglobin A1c  Need for influenza vaccination -     Flu Vaccine QUAD High Dose(Fluad)       Problem List Items Addressed This Visit       Cardiovascular and Mediastinum   Hypertensive disorder - Primary    BP at goal with lisinopril BP Readings from Last 3 Encounters:  07/12/21 (!) 110/56  06/18/21 (!) 169/89  11/16/20 120/78   Maintain current dose Monitor BP at home in AM Hold lisinopril if BP <120/70 Send BP readings via mychart in 1week. F/p in 17months      Relevant Orders   CBC (Completed)   Comprehensive metabolic panel (Completed)     Nervous and Auditory   Lumbar radiculopathy, chronic    Stable with meloxicam Repeat BMP      Relevant Medications   meloxicam (MOBIC) 15 MG tablet     Musculoskeletal and Integument   Osteopenia    Last dexa scan 2021 Advised to maintain daily calcium and vitamin D supplement      Relevant Medications   meloxicam (MOBIC) 15 MG tablet     Other   Hyperglycemia    Repeat hgbA1c      Relevant Orders   Hemoglobin A1c (Completed)   Hyperlipidemia     Repeat lipid panel      Relevant Orders   Comprehensive metabolic panel (Completed)   TSH (Completed)   Lipid panel (Completed)   Other Visit Diagnoses     Need for influenza vaccination       Relevant Orders   Flu Vaccine QUAD High Dose(Fluad) (Completed)       Follow up: Return in about 1 year (around 07/12/2022) for CPE (fasting).  Wilfred Lacy, NP

## 2021-07-15 DIAGNOSIS — R739 Hyperglycemia, unspecified: Secondary | ICD-10-CM | POA: Insufficient documentation

## 2021-07-15 NOTE — Assessment & Plan Note (Addendum)
BP at goal with lisinopril BP Readings from Last 3 Encounters:  07/12/21 (!) 110/56  06/18/21 (!) 169/89  11/16/20 120/78   Maintain current dose Monitor BP at home in AM Hold lisinopril if BP <120/70 Send BP readings via mychart in 1week. F/p in 36months

## 2021-07-15 NOTE — Assessment & Plan Note (Signed)
Stable with meloxicam Repeat BMP

## 2021-07-15 NOTE — Assessment & Plan Note (Signed)
Repeat lipid panel ?

## 2021-07-15 NOTE — Assessment & Plan Note (Signed)
Repeat hgbA1c 

## 2021-07-17 ENCOUNTER — Encounter: Payer: Self-pay | Admitting: Nurse Practitioner

## 2021-07-17 ENCOUNTER — Other Ambulatory Visit: Payer: Self-pay

## 2021-07-17 DIAGNOSIS — I10 Essential (primary) hypertension: Secondary | ICD-10-CM

## 2021-07-17 DIAGNOSIS — M5416 Radiculopathy, lumbar region: Secondary | ICD-10-CM

## 2021-07-17 MED ORDER — LISINOPRIL 10 MG PO TABS: 10.0000 mg | ORAL_TABLET | Freq: Every day | ORAL | 1 refills | Status: DC

## 2021-07-17 MED ORDER — TIZANIDINE HCL 2 MG PO TABS: 2.0000 mg | ORAL_TABLET | Freq: Two times a day (BID) | ORAL | 0 refills | Status: DC | PRN

## 2021-07-17 NOTE — Addendum Note (Signed)
Addended by: Wilfred Lacy L on: 07/17/2021 12:30 PM   Modules accepted: Orders

## 2021-07-25 ENCOUNTER — Other Ambulatory Visit: Payer: Self-pay | Admitting: Nurse Practitioner

## 2021-07-25 DIAGNOSIS — M5416 Radiculopathy, lumbar region: Secondary | ICD-10-CM

## 2021-07-26 ENCOUNTER — Ambulatory Visit: Payer: Medicare HMO

## 2021-07-31 ENCOUNTER — Ambulatory Visit (INDEPENDENT_AMBULATORY_CARE_PROVIDER_SITE_OTHER): Payer: Medicare HMO | Admitting: *Deleted

## 2021-07-31 DIAGNOSIS — Z0000 Encounter for general adult medical examination without abnormal findings: Secondary | ICD-10-CM

## 2021-07-31 NOTE — Patient Instructions (Signed)
[name] , Thank you for taking time to come for your Medicare Wellness Visit. I appreciate your ongoing commitment to your health goals. Please review the following plan we discussed and let me know if I can assist you in the future.   Screening recommendations/referrals: Colonoscopy: up to date Mammogram: up to date Bone Density: up to date Recommended yearly ophthalmology/optometry visit for glaucoma screening and checkup Recommended yearly dental visit for hygiene and checkup  Vaccinations: Influenza vaccine: up to date Pneumococcal vaccine: up to date Tdap vaccine: Education provided Shingles vaccine: Education provided    Advanced directives: Education provided  Conditions/risks identified:   Next appointment:    Preventive Care 36 Years and Older, Female Preventive care refers to lifestyle choices and visits with your health care provider that can promote health and wellness. What does preventive care include? A yearly physical exam. This is also called an annual well check. Dental exams once or twice a year. Routine eye exams. Ask your health care provider how often you should have your eyes checked. Personal lifestyle choices, including: Daily care of your teeth and gums. Regular physical activity. Eating a healthy diet. Avoiding tobacco and drug use. Limiting alcohol use. Practicing safe sex. Taking low-dose aspirin every day. Taking vitamin and mineral supplements as recommended by your health care provider. What happens during an annual well check? The services and screenings done by your health care provider during your annual well check will depend on your age, overall health, lifestyle risk factors, and family history of disease. Counseling  Your health care provider may ask you questions about your: Alcohol use. Tobacco use. Drug use. Emotional well-being. Home and relationship well-being. Sexual activity. Eating habits. History of falls. Memory and  ability to understand (cognition). Work and work Statistician. Reproductive health. Screening  You may have the following tests or measurements: Height, weight, and BMI. Blood pressure. Lipid and cholesterol levels. These may be checked every 5 years, or more frequently if you are over 65 years old. Skin check. Lung cancer screening. You may have this screening every year starting at age 63 if you have a 30-pack-year history of smoking and currently smoke or have quit within the past 15 years. Fecal occult blood test (FOBT) of the stool. You may have this test every year starting at age 23. Flexible sigmoidoscopy or colonoscopy. You may have a sigmoidoscopy every 5 years or a colonoscopy every 10 years starting at age 12. Hepatitis C blood test. Hepatitis B blood test. Sexually transmitted disease (STD) testing. Diabetes screening. This is done by checking your blood sugar (glucose) after you have not eaten for a while (fasting). You may have this done every 1-3 years. Bone density scan. This is done to screen for osteoporosis. You may have this done starting at age 81. Mammogram. This may be done every 1-2 years. Talk to your health care provider about how often you should have regular mammograms. Talk with your health care provider about your test results, treatment options, and if necessary, the need for more tests. Vaccines  Your health care provider may recommend certain vaccines, such as: Influenza vaccine. This is recommended every year. Tetanus, diphtheria, and acellular pertussis (Tdap, Td) vaccine. You may need a Td booster every 10 years. Zoster vaccine. You may need this after age 39. Pneumococcal 13-valent conjugate (PCV13) vaccine. One dose is recommended after age 33. Pneumococcal polysaccharide (PPSV23) vaccine. One dose is recommended after age 36. Talk to your health care provider about which screenings and  vaccines you need and how often you need them. This information is  not intended to replace advice given to you by your health care provider. Make sure you discuss any questions you have with your health care provider. Document Released: 11/03/2015 Document Revised: 06/26/2016 Document Reviewed: 08/08/2015 Elsevier Interactive Patient Education  2017 [place] Prevention in the Home Falls can cause injuries. They can happen to people of all ages. There are many things you can do to make your home safe and to help prevent falls. What can I do on the outside of my home? Regularly fix the edges of walkways and driveways and fix any cracks. Remove anything that might make you trip as you walk through a door, such as a raised step or threshold. Trim any bushes or trees on the path to your home. Use bright outdoor lighting. Clear any walking paths of anything that might make someone trip, such as rocks or tools. Regularly check to see if handrails are loose or broken. Make sure that both sides of any steps have handrails. Any raised decks and porches should have guardrails on the edges. Have any leaves, snow, or ice cleared regularly. Use sand or salt on walking paths during winter. Clean up any spills in your garage right away. This includes oil or grease spills. What can I do in the bathroom? Use night lights. Install grab bars by the toilet and in the tub and shower. Do not use towel bars as grab bars. Use non-skid mats or decals in the tub or shower. If you need to sit down in the shower, use a plastic, non-slip stool. Keep the floor dry. Clean up any water that spills on the floor as soon as it happens. Remove soap buildup in the tub or shower regularly. Attach bath mats securely with double-sided non-slip rug tape. Do not have throw rugs and other things on the floor that can make you trip. What can I do in the bedroom? Use night lights. Make sure that you have a light by your bed that is easy to reach. Do not use any sheets or blankets that  are too big for your bed. They should not hang down onto the floor. Have a firm chair that has side arms. You can use this for support while you get dressed. Do not have throw rugs and other things on the floor that can make you trip. What can I do in the kitchen? Clean up any spills right away. Avoid walking on wet floors. Keep items that you use a lot in easy-to-reach places. If you need to reach something above you, use a strong step stool that has a grab bar. Keep electrical cords out of the way. Do not use floor polish or wax that makes floors slippery. If you must use wax, use non-skid floor wax. Do not have throw rugs and other things on the floor that can make you trip. What can I do with my stairs? Do not leave any items on the stairs. Make sure that there are handrails on both sides of the stairs and use them. Fix handrails that are broken or loose. Make sure that handrails are as long as the stairways. Check any carpeting to make sure that it is firmly attached to the stairs. Fix any carpet that is loose or worn. Avoid having throw rugs at the top or bottom of the stairs. If you do have throw rugs, attach them to the floor with carpet tape. Make  sure that you have a light switch at the top of the stairs and the bottom of the stairs. If you do not have them, ask someone to add them for you. What else can I do to help prevent falls? Wear shoes that: Do not have high heels. Have rubber bottoms. Are comfortable and fit you well. Are closed at the toe. Do not wear sandals. If you use a stepladder: Make sure that it is fully opened. Do not climb a closed stepladder. Make sure that both sides of the stepladder are locked into place. Ask someone to hold it for you, if possible. Clearly mark and make sure that you can see: Any grab bars or handrails. First and last steps. Where the edge of each step is. Use tools that help you move around (mobility aids) if they are needed. These  include: Canes. Walkers. Scooters. Crutches. Turn on the lights when you go into a dark area. Replace any light bulbs as soon as they burn out. Set up your furniture so you have a clear path. Avoid moving your furniture around. If any of your floors are uneven, fix them. If there are any pets around you, be aware of where they are. Review your medicines with your doctor. Some medicines can make you feel dizzy. This can increase your chance of falling. Ask your doctor what other things that you can do to help prevent falls. This information is not intended to replace advice given to you by your health care provider. Make sure you discuss any questions you have with your health care provider. Document Released: 08/03/2009 Document Revised: 03/14/2016 Document Reviewed: 11/11/2014 Elsevier Interactive Patient Education  2017 [employer].

## 2021-07-31 NOTE — Progress Notes (Signed)
Subjective:   [name] is a 75 y.o. female who presents for Medicare Annual (Subsequent) preventive examination.  I connected with  [name] on 07/31/21 by a telephone enabled telemedicine application and verified that I am speaking with the correct person using two identifiers.   I discussed the limitations of evaluation and management by telemedicine. The patient expressed understanding and agreed to proceed.  Patient location: home  Review of Systems     Cardiac Risk Factors include: advanced age (>47men, >38 women);obesity (BMI >30kg/m2)     Objective:    Today's Vitals   There is no height or weight on file to calculate BMI.  Advanced Directives 07/31/2021 07/25/2020 03/07/2020 02/29/2020 03/04/2017  Does Patient Have a Medical Advance Directive? No Yes Yes Yes Yes  Type of Advance Directive - [place];Living will [place];Living will [place] -  Does patient want to make changes to medical advance directive? - - No - Patient declined No - Patient declined -  Copy of [place] in Chart? - No - copy requested Yes - validated most recent copy scanned in chart (See row information) No - copy requested -  Would patient like information on creating a medical advance directive? No - Patient declined - - - -    Current Medications (verified) Outpatient Encounter Medications as of 07/31/2021  Medication Sig   acetaminophen (TYLENOL) 650 MG CR tablet Tylenol Arthritis Pain 650 mg tablet,extended release   1 tablet as needed by oral route.   aspirin 81 MG EC tablet Adult Low Dose Aspirin 81 mg tablet,delayed release   1 tablet every day by oral route.   Cholecalciferol (VITAMIN D) 125 MCG (5000 UT) CAPS Take 5,000 Units by mouth daily.    lisinopril (ZESTRIL) 10 MG tablet Take 1 tablet (10 mg total) by mouth daily.   meloxicam (MOBIC) 15 MG tablet Take 0.5-1 tablets (7.5-15 mg total) by mouth daily  as needed for pain. With food   Multiple Vitamin (MULTIVITAMIN) tablet Take 1 tablet by mouth daily.   Omega-3 Fatty Acids (FISH OIL) 1000 MG CAPS Take 1,000 mg by mouth daily.   tiZANidine (ZANAFLEX) 2 MG tablet Take 1 tablet (2 mg total) by mouth every 12 (twelve) hours as needed for muscle spasms.   No facility-administered encounter medications on file as of 07/31/2021.    Allergies (verified) Hydrocodone-acetaminophen, Albuterol, Codeine, and Pravastatin sodium   History: Past Medical History:  Diagnosis Date   Arthritis    Cancer ([place]) 01/07/2006   Breast cancer- L Breast   Cataract    Complication of anesthesia    Hypertension    Injury of ligament of hand    right thumb ulnar collateral ligament injury.  recommend MR arthrogram of the thumb.   Neuromuscular disorder ([place])    scoliosis s/p spine surgery, has arthritis and pinched nerves   PONV (postoperative nausea and vomiting)    Past Surgical History:  Procedure Laterality Date   ABDOMINAL HYSTERECTOMY  2002   have ovaries   BREAST SURGERY  2006   left, cancer   EYE SURGERY  2012   KNEE SURGERY Left    SPINE SURGERY  1981 and 2007   TOTAL KNEE ARTHROPLASTY Right 03/07/2020   Procedure: TOTAL KNEE ARTHROPLASTY;  Surgeon: Paralee Cancel, MD;  Location: [facility];  Service: Orthopedics;  Laterality: Right;  70 mins   TUBAL LIGATION     Family History  Problem  Relation Age of Onset   Diabetes Mother        age early 44's   Heart disease Mother        age early 67's or 66's   Hypertension Mother    Hyperlipidemia Mother    Stroke Father 59       per pt mini   Diabetes Sister 64       type II   Hyperlipidemia Sister    Hypertension Sister    Kidney disease Brother        polycystic kidney   Diabetes Brother    Heart disease Brother    Alcohol abuse Maternal Grandfather    Cancer Maternal Grandmother        Leukemia   Cancer Paternal Aunt 15       Breast   Social History   Socioeconomic History    Marital status: Married    Spouse name: Not on file   Number of children: Not on file   Years of education: Not on file   Highest education level: Not on file  Occupational History   Occupation: Retired  Tobacco Use   Smoking status: Never   Smokeless tobacco: Never  Vaping Use   Vaping Use: Never used  Substance and Sexual Activity   Alcohol use: No   Drug use: No   Sexual activity: Yes  Other Topics Concern   Not on file  Social History Narrative   Married   Education: High School   Exercise: two times a week   Social Determinants of Radio broadcast assistant Strain: Low Risk    Difficulty of Paying Living Expenses: Not hard at all  Food Insecurity: No Food Insecurity   Worried About Charity fundraiser in the Last Year: Never true   Arboriculturist in the Last Year: Never true  Transportation Needs: No Transportation Needs   Lack of Transportation (Medical): No   Lack of Transportation (Non-Medical): No  Physical Activity: Insufficiently Active   Days of Exercise per Week: 3 days   Minutes of Exercise per Session: 30 min  Stress: No Stress Concern Present   Feeling of Stress : Not at all  Social Connections: Moderately Integrated   Frequency of Communication with Friends and Family: More than three times a week   Frequency of Social Gatherings with Friends and Family: More than three times a week   Attends Religious Services: More than 4 times per year   Active Member of [employer] or Organizations: No   Attends Music therapist: Never   Marital Status: Married    Tobacco Counseling Counseling given: Not Answered   Clinical Intake:  Pre-visit preparation completed: Yes  Pain : No/denies pain     Nutritional Risks: None Diabetes: No  How often do you need to have someone help you when you read instructions, pamphlets, or other written materials from your doctor or pharmacy?: 1 - Never  Diabetic?no  Interpreter Needed?: No  Information  entered by :: Leroy Kennedy LPN   Activities of Daily Living In your present state of health, do you have any difficulty performing the following activities: 07/31/2021  Hearing? Y  Vision? N  Difficulty concentrating or making decisions? N  Walking or climbing stairs? N  Dressing or bathing? N  Doing errands, shopping? N  Preparing Food and eating ? N  Using the Toilet? N  In the past six months, have you accidently leaked urine? N  Do you  have problems with loss of bowel control? N  Managing your Medications? N  Managing your Finances? N  Housekeeping or managing your Housekeeping? N  Some recent data might be hidden    Patient Care Team: Nche, Charlene Brooke, NP as PCP - General (Internal Medicine) Wonda Horner, MD as Consulting Physician (Gastroenterology) Zonia Kief, MD as Attending Physician (Rehabilitation)  Indicate any recent Medical Services you may have received from other than [facility] providers in the past year (date may be approximate).     Assessment:   This is a routine wellness examination for [place].  Hearing/Vision screen Hearing Screening - Comments:: Mild hearing loss left ear  Vision Screening - Comments:: Dr. Tommy Rainwater Up to date  Dietary issues and exercise activities discussed: Current Exercise Habits: Home exercise routine;Structured exercise class, Time (Minutes): 30, Frequency (Times/Week): 4, Weekly Exercise (Minutes/Week): 120, Intensity: Mild   Goals Addressed             This Visit's Progress    Patient Stated   Not on track    Would like to lose 10 pounds & be more active     Patient Stated       Would like to get sugar and cholesterol down       Depression Screen PHQ 2/9 Scores 07/31/2021 07/12/2021 07/25/2020 02/28/2020 11/04/2019 06/10/2019 05/26/2019  PHQ - 2 Score 0 0 0 0 0 0 0  PHQ- 9 Score - 2 - - - - -    Fall Risk Fall Risk  07/31/2021 07/12/2021 07/25/2020 02/28/2020 11/04/2019  Falls in the past year? 0 0 1 1 0  Number falls  in past yr: 0 0 0 0 -  Injury with Fall? 0 0 1 1 0  Comment - - - fell on already bad right knee -  Risk for fall due to : - History of fall(s);Orthopedic patient History of fall(s) - -  Follow up Falls evaluation completed;Falls prevention discussed Falls evaluation completed;Education provided;Falls prevention discussed Falls prevention discussed Falls evaluation completed Falls evaluation completed    FALL RISK PREVENTION PERTAINING TO THE HOME:  Any stairs in or around the home? Yes  If so, are there any without handrails? No  Home free of loose throw rugs in walkways, pet beds, electrical cords, etc? Yes  Adequate lighting in your home to reduce risk of falls? Yes   ASSISTIVE DEVICES UTILIZED TO PREVENT FALLS:  Life alert? No  Use of a cane, walker or w/c? Yes  Grab bars in the bathroom? No  Shower chair or bench in shower? No  Elevated toilet seat or a handicapped toilet? Yes   TIMED UP AND GO:  Was the test performed? No .    Cognitive Function:   Normal cognitive status assessed by direct observation by this Nurse Health Advisor. No abnormalities found.        Immunizations Immunization History  Administered Date(s) Administered   Fluad Quad(high Dose 65+) 07/07/2020, 07/12/2021   Hepatitis B 09/18/2011, 02/10/2012   Influenza, High Dose Seasonal PF 07/27/2014, 07/13/2015, 07/30/2016, 07/29/2017, 07/27/2018   Influenza,inj,Quad PF,6+ Mos 08/02/2019   Influenza-Unspecified 06/21/2013, 07/22/2015   Moderna Sars-Covid-2 Vaccination 11/05/2019, 11/10/2019, 12/06/2019, 12/08/2019, 09/07/2020   Pneumococcal Conjugate-13 01/09/2015   Pneumococcal-Unspecified 08/02/2011   Tdap 07/22/2011   Zoster, Live 08/21/2010    TDAP status: Due, Education has been provided regarding the importance of this vaccine. Advised may receive this vaccine at local pharmacy or Health Dept. Aware to provide a copy of the vaccination record  if obtained from local pharmacy or Health Dept.  Verbalized acceptance and understanding.  Flu Vaccine status: Up to date  Pneumococcal vaccine status: Up to date  Covid-19 vaccine status: Information provided on how to obtain vaccines.   Qualifies for Shingles Vaccine? Yes   Zostavax completed Yes   Shingrix Completed?: No.    Education has been provided regarding the importance of this vaccine. Patient has been advised to call insurance company to determine out of pocket expense if they have not yet received this vaccine. Advised may also receive vaccine at local pharmacy or Health Dept. Verbalized acceptance and understanding.  Screening Tests Health Maintenance  Topic Date Due   Zoster Vaccines- Shingrix (1 of 2) Never done   TETANUS/TDAP  07/21/2021   COLONOSCOPY (Pts 45-65yrs Insurance coverage will need to be confirmed)  10/21/2024   INFLUENZA VACCINE  Completed   DEXA SCAN  Completed   COVID-19 Vaccine  Completed   Hepatitis C Screening  Completed   HPV VACCINES  Aged Out    Health Maintenance  Health Maintenance Due  Topic Date Due   Zoster Vaccines- Shingrix (1 of 2) Never done   TETANUS/TDAP  07/21/2021    Colorectal cancer screening: Type of screening: Colonoscopy. Completed  . Repeat every 10 years  Mammogram status: Completed  . Repeat every year  Bone Density status: Completed  . Results reflect: Bone density results: OSTEOPENIA. Repeat every 2 years.  Lung Cancer Screening: (Low Dose CT Chest recommended if Age 39-80 years, 30 pack-year currently smoking OR have quit w/in 15years.) does not qualify.   Lung Cancer Screening Referral:   Additional Screening:  Hepatitis C Screening: does not qualify; Completed 2020  Vision Screening: Recommended annual ophthalmology exams for early detection of glaucoma and other disorders of the eye. Is the patient up to date with their annual eye exam?  Yes  Who is the provider or what is the name of the office in which the patient attends annual eye exams? Dr.  Tommy Rainwater If pt is not established with a provider, would they like to be referred to a provider to establish care? No .   Dental Screening: Recommended annual dental exams for proper oral hygiene  Community Resource Referral / Chronic Care Management: CRR required this visit?  No   CCM required this visit?  No      Plan:     I have personally reviewed and noted the following in the patient's chart:   Medical and social history Use of alcohol, tobacco or illicit drugs  Current medications and supplements including opioid prescriptions.  Functional ability and status Nutritional status Physical activity Advanced directives List of other physicians Hospitalizations, surgeries, and ER visits in previous 12 months Vitals Screenings to include cognitive, depression, and falls Referrals and appointments  In addition, I have reviewed and discussed with patient certain preventive protocols, quality metrics, and best practice recommendations. A written personalized care plan for preventive services as well as general preventive health recommendations were provided to patient.     Leroy Kennedy, LPN   [id number]   Nurse Notes:

## 2021-08-13 DIAGNOSIS — Z1231 Encounter for screening mammogram for malignant neoplasm of breast: Secondary | ICD-10-CM | POA: Diagnosis not present

## 2021-08-13 LAB — HM MAMMOGRAPHY

## 2021-08-15 ENCOUNTER — Encounter: Payer: Self-pay | Admitting: Nurse Practitioner

## 2021-08-30 DIAGNOSIS — Z96652 Presence of left artificial knee joint: Secondary | ICD-10-CM | POA: Diagnosis not present

## 2021-09-20 NOTE — Telephone Encounter (Signed)
Chart supports rx refill Last ov: 02/20/2021 Last refill: 07/17/2021

## 2021-11-29 ENCOUNTER — Encounter: Payer: Self-pay | Admitting: Nurse Practitioner

## 2021-11-29 DIAGNOSIS — I10 Essential (primary) hypertension: Secondary | ICD-10-CM

## 2021-11-30 MED ORDER — LISINOPRIL 10 MG PO TABS: 10.0000 mg | ORAL_TABLET | Freq: Every day | ORAL | 3 refills | Status: DC

## 2021-11-30 NOTE — Telephone Encounter (Signed)
Please advise message below  °

## 2022-01-01 ENCOUNTER — Encounter (HOSPITAL_COMMUNITY): Payer: Self-pay | Admitting: Emergency Medicine

## 2022-01-01 ENCOUNTER — Emergency Department (HOSPITAL_COMMUNITY)
Admission: EM | Admit: 2022-01-01 | Discharge: 2022-01-01 | Disposition: A | Discharge status: Home / Self Care | Payer: Medicare HMO | Attending: Emergency Medicine | Admitting: Emergency Medicine

## 2022-01-01 DIAGNOSIS — Z7982 Long term (current) use of aspirin: Secondary | ICD-10-CM | POA: Insufficient documentation

## 2022-01-01 DIAGNOSIS — R519 Headache, unspecified: Secondary | ICD-10-CM | POA: Diagnosis not present

## 2022-01-01 DIAGNOSIS — I10 Essential (primary) hypertension: Secondary | ICD-10-CM | POA: Insufficient documentation

## 2022-01-01 DIAGNOSIS — J3489 Other specified disorders of nose and nasal sinuses: Secondary | ICD-10-CM | POA: Insufficient documentation

## 2022-01-01 DIAGNOSIS — R0981 Nasal congestion: Secondary | ICD-10-CM | POA: Diagnosis not present

## 2022-01-01 DIAGNOSIS — Z79899 Other long term (current) drug therapy: Secondary | ICD-10-CM | POA: Diagnosis not present

## 2022-01-01 LAB — CBC WITH DIFFERENTIAL/PLATELET
Abs Immature Granulocytes: 0.01 10*3/uL (ref 0.00–0.07)
Basophils Absolute: 0.1 10*3/uL (ref 0.0–0.1)
Basophils Relative: 1 %
Eosinophils Absolute: 0.1 10*3/uL (ref 0.0–0.5)
Eosinophils Relative: 1 %
HCT: 41.4 % (ref 36.0–46.0)
Hemoglobin: 13.6 g/dL (ref 12.0–15.0)
Immature Granulocytes: 0 %
Lymphocytes Relative: 34 %
Lymphs Abs: 2.1 10*3/uL (ref 0.7–4.0)
MCH: 30.6 pg (ref 26.0–34.0)
MCHC: 32.9 g/dL (ref 30.0–36.0)
MCV: 93 fL (ref 80.0–100.0)
Monocytes Absolute: 0.5 10*3/uL (ref 0.1–1.0)
Monocytes Relative: 7 %
Neutro Abs: 3.6 10*3/uL (ref 1.7–7.7)
Neutrophils Relative %: 57 %
Platelets: 229 10*3/uL (ref 150–400)
RBC: 4.45 MIL/uL (ref 3.87–5.11)
RDW: 12.5 % (ref 11.5–15.5)
WBC: 6.3 10*3/uL (ref 4.0–10.5)
nRBC: 0 % (ref 0.0–0.2)

## 2022-01-01 LAB — BASIC METABOLIC PANEL
Anion gap: 9 (ref 5–15)
BUN: 15 mg/dL (ref 8–23)
CO2: 22 mmol/L (ref 22–32)
Calcium: 9.5 mg/dL (ref 8.9–10.3)
Chloride: 108 mmol/L (ref 98–111)
Creatinine, Ser: 0.9 mg/dL (ref 0.44–1.00)
GFR, Estimated: >60 mL/min (ref >60)
Glucose, Bld: 99 mg/dL (ref 70–99)
Potassium: 4 mmol/L (ref 3.5–5.1)
Sodium: 139 mmol/L (ref 135–145)

## 2022-01-01 MED ORDER — ACETAMINOPHEN 500 MG PO TABS
1000.0000 mg | ORAL_TABLET | Freq: Once | ORAL | Status: AC
  Administered 2022-01-01: 1000 mg via ORAL
  Filled 2022-01-01: qty 2

## 2022-01-01 NOTE — ED Provider Notes (Signed)
?[facility] ?Provider Note ? ? ?CSN: [id number] ?Arrival date & time: 01/01/22  1127 ? ?  ? ?History ? ?Chief Complaint  ?Patient presents with  ? Hypertension  ? ? ?[name] is a 76 y.o. female. ? ?HPI ? ? Pt is a 76 y/o female with a h/o CA, arthritis, cataract, HTN, neuromuscular disorder, who presents to the ED today for eval of HTN. States her BP has been elevated for the last week.  ? ?She takes her BP meds in the morning. She has been checking her Bps before she takes her meds in the AM. Prior to taking them and her bp was in the 150s. She took her meds after this and it has continued to be high throughout the day.  ? ?Denies chest pain, sob. She is c/o a headache. She thinks her headache may be due to a sinus infection as she has had some congestion, rhinorrhea for the last few days. She tried taking allegra from this. Does not report any associated vision changes or neuro complaints ? ?Home Medications ?Prior to Admission medications   ?Medication Sig Start Date End Date Taking? Authorizing Provider  ?acetaminophen (TYLENOL) 650 MG CR tablet Tylenol Arthritis Pain 650 mg tablet,extended release ?  1 tablet as needed by oral route.    [provider]  ?aspirin 81 MG EC tablet Adult Low Dose Aspirin 81 mg tablet,delayed release ?  1 tablet every day by oral route.    [provider]  ?Cholecalciferol (VITAMIN D) 125 MCG (5000 UT) CAPS Take 5,000 Units by mouth daily.     [provider]  ?lisinopril (ZESTRIL) 10 MG tablet Take 1 tablet (10 mg total) by mouth daily. 11/30/21   Nche, Charlene Brooke, NP  ?meloxicam (MOBIC) 15 MG tablet Take 0.5-1 tablets (7.5-15 mg total) by mouth daily as needed for pain. With food 07/12/21   Nche, Charlene Brooke, NP  ?Multiple Vitamin (MULTIVITAMIN) tablet Take 1 tablet by mouth daily.    [provider]  ?Omega-3 Fatty Acids (FISH OIL) 1000 MG CAPS Take 1,000 mg by mouth daily.    [provider]   ?tiZANidine (ZANAFLEX) 2 MG tablet TAKE 1 TABLET (2 MG TOTAL) BY MOUTH EVERY 12 (TWELVE) HOURS AS NEEDED FOR MUSCLE SPASMS. 09/20/21   Nche, Charlene Brooke, NP  ?   ? ?Allergies    ?Hydrocodone-acetaminophen, Albuterol, Codeine, and Pravastatin sodium   ? ?Review of Systems   ?Review of Systems ?See HPI for pertinent positives or negatives. ? ? ?Physical Exam ?Updated Vital Signs ?BP 124/77   Pulse 84   Temp 98.3 ?F (36.8 ?C) (Oral)   Resp 15   Ht '5\' 5"'$  (1.651 m)   Wt 74.8 kg   SpO2 96%   BMI 27.46 kg/m?  ?Physical Exam ?Vitals and nursing note reviewed.  ?Constitutional:   ?   General: She is not in acute distress. ?   Appearance: She is well-developed.  ?HENT:  ?   Head: Normocephalic and atraumatic.  ?Eyes:  ?   Conjunctiva/sclera: Conjunctivae normal.  ?Cardiovascular:  ?   Rate and Rhythm: Normal rate and regular rhythm.  ?   Heart sounds: No murmur heard. ?Pulmonary:  ?   Effort: Pulmonary effort is normal. No respiratory distress.  ?   Breath sounds: Normal breath sounds.  ?Abdominal:  ?   Palpations: Abdomen is soft.  ?   Tenderness: There is no abdominal tenderness.  ?Musculoskeletal:     ?  General: No swelling.  ?   Cervical back: Neck supple.  ?Skin: ?   General: Skin is warm and dry.  ?   Capillary Refill: Capillary refill takes less than 2 seconds.  ?Neurological:  ?   Mental Status: She is alert.  ?   Comments: Mental Status:  ?Alert, thought content appropriate, able to give a coherent history. Speech fluent without evidence of aphasia. Able to follow 2 step commands without difficulty.  ?Cranial Nerves:  ?II:  pupils equal, round, reactive to light ?III,IV, VI: ptosis not present, extra-ocular motions intact bilaterally  ?V,VII: smile symmetric, facial light touch sensation equal ?VIII: hearing grossly normal to voice  ?X: uvula elevates symmetrically  ?XI: bilateral shoulder shrug symmetric and strong ?XII: midline tongue extension without fassiculations ?Motor:  ?Normal tone. 5/5 strength  of BUE and BLE major muscle groups  ?Gait: normal gait and balance.  ?  ?Psychiatric:     ?   Mood and Affect: Mood normal.  ? ? ?ED Results / Procedures / Treatments   ?Labs ?(all labs ordered are listed, but only abnormal results are displayed) ?Labs Reviewed  ?CBC WITH DIFFERENTIAL/PLATELET  ?BASIC METABOLIC PANEL  ? ? ?EKG ?None ? ?Radiology ?No results found. ? ?Procedures ?Procedures  ? ? ?Medications Ordered in ED ?Medications  ?acetaminophen (TYLENOL) tablet 1,000 mg (1,000 mg Oral Given 01/01/22 1223)  ? ? ?ED Course/ Medical Decision Making/ A&P ?  ?                        ?Medical Decision Making ?Amount and/or Complexity of Data Reviewed ?Labs: ordered. ? ?Risk ?OTC drugs. ? ? ?This patient presents to the ED for concern of HTN, this involves an extensive number of treatment options, and is a complaint that carries with it a high risk of complications and morbidity.  The differential diagnosis includes but is not limited to uncontrolled HTN, HTN urgency, HTN emergency  ? ?Comorbidities that complicate the patient evaluation: ?Patient?s presentation is complicated by their history of htn ? ?Additional history obtained: ?Records reviewed Care Everywhere/External Records ? ?Lab Tests: ?I Ordered, and personally interpreted labs.  The pertinent results include:   ?CBC wnl ?BMP wnl ? ?EKG - nsr, low voltage in the extremity leads ? ?I agree with the radiologist interpretation ? ?Medicines ordered and prescription drug management: ?I ordered medication including tylenol  for headache  ?Reevaluation of the patient after these medicines showed that the patient    improved ? ?Test Considered: ?Do not feel that ct head is emergently indicated due to normal neurologic exam, no neuro complaints other than headache, suspect this is related to sinus headache ? ?Critical Interventions: tylenol ? ?Complexity of problems addressed: ?Patient?s presentation is most consistent with  acute complicated illness/injury requiring  diagnostic workup ? ?Disposition: ?After consideration of the diagnostic results and the patient?s response to treatment,  ?I feel that the patent would benefit from discharge home. Pts w/u here is reassuring. I doubt ich or other emergent cause of headache.  . Her bp improved to 093 systolic after tylenol and her headache resolved. I do not feel that titration of her bp meds is necessary today. Have advised she continue to monitor bps at home and call her pcp. Advised fluticasone for her nasal congestion. No fevers or significant sxs to suggest bacterial cause of sinus infection. Do not feel further imaging of the head is necessary. No evidence of htn emergency.  ? ?Discussed findings and  plan. Advised pcp f/u and return to the ED if no improvement or worsening sxs. Pt voices understanding of the plan and reasons to return. All questions answered. Pt stable for discharge.  ? ? ?Final Clinical Impression(s) / ED Diagnoses ?Final diagnoses:  ?Hypertension, unspecified type  ? ? ?Rx / DC Orders ?ED Discharge Orders   ? ? None  ? ?  ? ? ?  ?Tivis Ringer Polo Mcmartin S, PA-C ?01/01/22 1409 ? ?  ?Deno Etienne, DO ?01/01/22 1527 ? ?

## 2022-01-01 NOTE — ED Triage Notes (Signed)
Per pt, states he BP has been elevated for the past couple of days-states she has been on Lisinopril for 20 years-states she checks BP at home-does get anxious when she sees it is elevated ?

## 2022-01-01 NOTE — ED Notes (Signed)
PA in with pt at present time ?

## 2022-01-01 NOTE — Discharge Instructions (Signed)
Please keep a log of your blood pressures at home ? ?Please follow up with your primary care provider within 5-7 days for re-evaluation of your symptoms. If you do not have a primary care provider, information for a healthcare clinic has been provided for you to make arrangements for follow up care. Please return to the emergency department for any new or worsening symptoms. ? ?

## 2022-01-17 ENCOUNTER — Ambulatory Visit: Payer: Medicare HMO | Admitting: Nurse Practitioner

## 2022-01-22 DIAGNOSIS — Z96652 Presence of left artificial knee joint: Secondary | ICD-10-CM | POA: Diagnosis not present

## 2022-02-13 DIAGNOSIS — H10022 Other mucopurulent conjunctivitis, left eye: Secondary | ICD-10-CM | POA: Diagnosis not present

## 2022-02-27 DIAGNOSIS — H6122 Impacted cerumen, left ear: Secondary | ICD-10-CM | POA: Diagnosis not present

## 2022-02-27 DIAGNOSIS — J209 Acute bronchitis, unspecified: Secondary | ICD-10-CM | POA: Diagnosis not present

## 2022-02-27 DIAGNOSIS — J0141 Acute recurrent pansinusitis: Secondary | ICD-10-CM | POA: Diagnosis not present

## 2022-03-21 ENCOUNTER — Encounter: Payer: Self-pay | Admitting: Family Medicine

## 2022-03-21 ENCOUNTER — Ambulatory Visit (INDEPENDENT_AMBULATORY_CARE_PROVIDER_SITE_OTHER): Payer: Medicare HMO | Admitting: Family Medicine

## 2022-03-21 VITALS — BP 121/72 | HR 95 | Temp 96.9°F | Ht 65.0 in | Wt 168.0 lb

## 2022-03-21 DIAGNOSIS — M792 Neuralgia and neuritis, unspecified: Secondary | ICD-10-CM | POA: Diagnosis not present

## 2022-03-21 DIAGNOSIS — Z7982 Long term (current) use of aspirin: Secondary | ICD-10-CM | POA: Diagnosis not present

## 2022-03-21 DIAGNOSIS — M1990 Unspecified osteoarthritis, unspecified site: Secondary | ICD-10-CM | POA: Diagnosis not present

## 2022-03-21 DIAGNOSIS — I10 Essential (primary) hypertension: Secondary | ICD-10-CM | POA: Diagnosis not present

## 2022-03-21 DIAGNOSIS — Z833 Family history of diabetes mellitus: Secondary | ICD-10-CM | POA: Diagnosis not present

## 2022-03-21 DIAGNOSIS — J029 Acute pharyngitis, unspecified: Secondary | ICD-10-CM | POA: Diagnosis not present

## 2022-03-21 DIAGNOSIS — Z008 Encounter for other general examination: Secondary | ICD-10-CM | POA: Diagnosis not present

## 2022-03-21 DIAGNOSIS — I739 Peripheral vascular disease, unspecified: Secondary | ICD-10-CM | POA: Diagnosis not present

## 2022-03-21 DIAGNOSIS — Z803 Family history of malignant neoplasm of breast: Secondary | ICD-10-CM | POA: Diagnosis not present

## 2022-03-21 DIAGNOSIS — R052 Subacute cough: Secondary | ICD-10-CM

## 2022-03-21 DIAGNOSIS — Z853 Personal history of malignant neoplasm of breast: Secondary | ICD-10-CM | POA: Diagnosis not present

## 2022-03-21 DIAGNOSIS — Z885 Allergy status to narcotic agent status: Secondary | ICD-10-CM | POA: Diagnosis not present

## 2022-03-21 DIAGNOSIS — Z8249 Family history of ischemic heart disease and other diseases of the circulatory system: Secondary | ICD-10-CM | POA: Diagnosis not present

## 2022-03-21 LAB — POCT RAPID STREP A (OFFICE): Rapid Strep A Screen: NEGATIVE

## 2022-03-21 LAB — POC SOFIA SARS ANTIGEN FIA: SARS Coronavirus 2 Ag: NEGATIVE

## 2022-03-21 MED ORDER — MONTELUKAST SODIUM 10 MG PO TABS: 10.0000 mg | ORAL_TABLET | Freq: Every day | ORAL | 3 refills | Status: DC

## 2022-03-21 MED ORDER — IPRATROPIUM BROMIDE HFA 17 MCG/ACT IN AERS: 2.0000 | INHALATION_SPRAY | Freq: Four times a day (QID) | RESPIRATORY_TRACT | 0 refills | Status: DC | PRN

## 2022-03-21 NOTE — Patient Instructions (Signed)
Use Singulair and ipratropium inhaler for cough.  If no improvement please come back.  If you develop extreme chest pain or shortness of breath please call us back or go to the emergency department.

## 2022-03-21 NOTE — Progress Notes (Signed)
   [name] is a 76 y.o. female who presents today for an office visit.  Assessment/Plan:  New/Acute Problems: Subacute cough Likely bronchitis versus postviral cough syndrome Patient intolerant of albuterol due to excessive heartbeat Given this, recommend ipratropium inhaler and addition of Singulair Continue with OTC supportive care Return precautions discussed Follow-up as needed  Chronic Problems Addressed Today: No problem-specific Assessment & Plan notes found for this encounter.     Subjective:  HPI:  Cough: Patient complains of nasal congestion, productive cough, and pharyngitis and ears burning "on inside" .  Symptoms began 5 weeks ago.  The cough is non-productive, without wheezing, dyspnea or hemoptysis and is aggravated by exercise Associated symptoms include:change in voice. Patient does not have new pets. Patient does not have a history of asthma. Patient does have a history of environmental allergens. Patient does not have recent travel. Patient does not have a history of smoking. Patient  does not have previous Chest X-ray.   Patient reports that she was seen by urgent care on 5/10.  Patient completed 5-day course of prednisone and 10-day course of Augmentin.  Patient has also been trying Mucinex, Flonase,       Objective:  Physical Exam: BP 121/72 (BP Location: Right Arm, Patient Position: Sitting, Cuff Size: Normal)   Pulse 95   Temp (!) 96.9 F (36.1 C) (Temporal)   Ht '5\' 5"'$  (1.651 m)   Wt 168 lb (76.2 kg)   SpO2 97%   BMI 27.96 kg/m   Gen: No acute distress, resting comfortably CV: Regular rate and rhythm with no murmurs appreciated Pulm: Normal work of breathing, clear to auscultation bilaterally with no crackles, wheezes, or rhonchi Neuro: Grossly normal, moves all extremities Psych: Normal affect and thought content      Josephine Igo 03/21/2022 2:19 PM

## 2022-04-08 ENCOUNTER — Telehealth: Payer: Self-pay | Admitting: Nurse Practitioner

## 2022-04-08 NOTE — Telephone Encounter (Signed)
Pt is wanting to transfer her care from [place] to [facility]. Please let me know and I will give her a call back. [phone]

## 2022-05-09 NOTE — Progress Notes (Signed)
New Patient Office Visit  Subjective    Patient ID: [name], female    DOB: July 31, 1946  Age: 76 y.o. MRN: [id number]  CC:  Chief Complaint  Patient presents with   Establish Care    [facility]- establish care. No concerns.  Not fasting today.      HPI [name] presents to transfer care to a new provider.  Introduced to Designer, jewellery role and practice setting.  All questions answered.  Discussed provider/patient relationship and expectations.  She has a history of scoliosis and degenerative disc disease. She has had multiple spinal surgeries. She is active and does silver sneakers and water aerobics 4-5 times per week. She takes meloxicam '15mg'$  as needed for pain. She states that she is starting to take it almost daily.   She has a history of hypertension. She has been checking her blood pressure at home. She denies chest pain, shortness of breath, and headaches.   She has a history of elevated cholesterol. She has taken atorvastatin which elevated her liver function, simvastatin, and pravastatin which cause joint and muscle aches. She is interested if there is another cholesterol medication she can take.  She has a history of prediabetes. She has been trying to watch her diet and increase exercise. She also has osteopenia and is taking a multivitamin.   She has a history of an ear infection in April. She is still having some ear pain in her left ear. She was given ear drops, but has a hard time using them because she can't hear well after. She goes in the pool for water aerobics and doesn't wear ear plugs or a cap. She denies fevers, sore throat, and nasal congestion.   Depression Screen done:     05/10/2022    9:29 AM 03/21/2022    1:43 PM 07/31/2021   12:59 PM 07/12/2021   11:03 AM 07/25/2020    8:24 AM  Depression screen PHQ 2/9  Decreased Interest 0 0 0 0 0  Down, Depressed, Hopeless 0 0 0 0 0  PHQ - 2 Score 0 0 0 0 0  Altered sleeping    1   Tired, decreased energy   1  1   Change in appetite   1 0   Feeling bad or failure about yourself    0 0   Trouble concentrating   0 0   Moving slowly or fidgety/restless   0 0   Suicidal thoughts   0 0   PHQ-9 Score    2   Difficult doing work/chores   Not difficult at all Not difficult at all     Outpatient Encounter Medications as of 05/10/2022  Medication Sig   acetaminophen (TYLENOL) 650 MG CR tablet Tylenol Arthritis Pain 650 mg tablet,extended release   1 tablet as needed by oral route.   amoxicillin-clavulanate (AUGMENTIN) 875-125 MG tablet Take 1 tablet by mouth 2 (two) times daily.   aspirin 81 MG EC tablet Adult Low Dose Aspirin 81 mg tablet,delayed release   1 tablet every day by oral route.   Black Pepper-Turmeric 3-500 MG CAPS    Camphor-Menthol-Methyl Sal (SALONPAS) 3.10-26-08 % PTCH 1 patch as needed by topical route.   Cholecalciferol (VITAMIN D) 125 MCG (5000 UT) CAPS Take 5,000 Units by mouth daily.    ezetimibe (ZETIA) 10 MG tablet Take 1 tablet (10 mg total) by mouth daily.   lisinopril (ZESTRIL) 10 MG tablet Take 1 tablet (10 mg total) by mouth  daily.   Multiple Vitamins-Minerals (CENTRUM SILVER 50+WOMEN) TABS    Omega-3 Fatty Acids (FISH OIL) 1000 MG CAPS Take 2,000 mg by mouth daily.   tiZANidine (ZANAFLEX) 2 MG tablet TAKE 1 TABLET (2 MG TOTAL) BY MOUTH EVERY 12 (TWELVE) HOURS AS NEEDED FOR MUSCLE SPASMS.   [DISCONTINUED] meloxicam (MOBIC) 15 MG tablet Take 0.5-1 tablets (7.5-15 mg total) by mouth daily as needed for pain. With food   meloxicam (MOBIC) 15 MG tablet Take 0.5-1 tablets (7.5-15 mg total) by mouth daily as needed for pain. With food   Multiple Vitamin (MULTIVITAMIN) tablet Take 1 tablet by mouth daily.   [DISCONTINUED] B Complex-C (B-COMPLEX WITH VITAMIN C) tablet    [DISCONTINUED] ciprofloxacin (CILOXAN) 0.3 % ophthalmic solution SMARTSIG:In Eye(s)   [DISCONTINUED] ipratropium (ATROVENT HFA) 17 MCG/ACT inhaler Inhale 2 puffs into the lungs every 6 (six) hours as needed for  wheezing.   [DISCONTINUED] montelukast (SINGULAIR) 10 MG tablet Take 1 tablet (10 mg total) by mouth at bedtime.   [DISCONTINUED] neomycin-polymyxin-hydrocortisone (CORTISPORIN) 3.5-10000-1 OTIC suspension SMARTSIG:Left Ear   No facility-administered encounter medications on file as of 05/10/2022.    Past Medical History:  Diagnosis Date   Arthritis    Cancer ([place]) 01/07/2006   Breast cancer- L Breast   Cataract    Complication of anesthesia    Hypertension    Injury of ligament of hand    right thumb ulnar collateral ligament injury.  recommend MR arthrogram of the thumb.   Neuromuscular disorder ([place])    scoliosis s/p spine surgery, has arthritis and pinched nerves   PONV (postoperative nausea and vomiting)    Scoliosis     Past Surgical History:  Procedure Laterality Date   ABDOMINAL HYSTERECTOMY  2002   have ovaries   BREAST SURGERY  2006   left, cancer   EYE SURGERY  2012   KNEE SURGERY Left    SPINE SURGERY  1981 and 2007   TOTAL KNEE ARTHROPLASTY Right 03/07/2020   Procedure: TOTAL KNEE ARTHROPLASTY;  Surgeon: Paralee Cancel, MD;  Location: [facility];  Service: Orthopedics;  Laterality: Right;  70 mins   TUBAL LIGATION      Family History  Problem Relation Age of Onset   Diabetes Mother        age early 4's   Heart disease Mother        age early 90's or 2's   Hypertension Mother    Hyperlipidemia Mother    Stroke Father 65       per pt mini   Diabetes Sister 34       type II   Hyperlipidemia Sister    Hypertension Sister    Kidney disease Brother        polycystic kidney   Diabetes Brother    Heart disease Brother    Alcohol abuse Maternal Grandfather    Cancer Maternal Grandmother        Leukemia   Cancer Paternal Aunt 66       Breast    Social History   Socioeconomic History   Marital status: Married    Spouse name: Not on file   Number of children: Not on file   Years of education: Not on file   Highest education level: Not on file   Occupational History   Occupation: Retired  Tobacco Use   Smoking status: Never   Smokeless tobacco: Never  Vaping Use   Vaping Use: Never used  Substance and Sexual Activity   Alcohol  use: No   Drug use: No   Sexual activity: Yes  Other Topics Concern   Not on file  Social History Narrative   Married   Education: High School   Exercise: two times a week   Social Determinants of Health   Financial Resource Strain: Low Risk  (07/31/2021)   Overall Financial Resource Strain (CARDIA)    Difficulty of Paying Living Expenses: Not hard at all  Food Insecurity: No Food Insecurity (07/31/2021)   Hunger Vital Sign    Worried About Running Out of Food in the Last Year: Never true    Ran Out of Food in the Last Year: Never true  Transportation Needs: No Transportation Needs (07/31/2021)   PRAPARE - Hydrologist (Medical): No    Lack of Transportation (Non-Medical): No  Physical Activity: Insufficiently Active (07/31/2021)   Exercise Vital Sign    Days of Exercise per Week: 3 days    Minutes of Exercise per Session: 30 min  Stress: No Stress Concern Present (07/31/2021)   [place]    Feeling of Stress : Not at all  Social Connections: Moderately Integrated (07/31/2021)   Social Connection and Isolation Panel [NHANES]    Frequency of Communication with Friends and Family: More than three times a week    Frequency of Social Gatherings with Friends and Family: More than three times a week    Attends Religious Services: More than 4 times per year    Active Member of [employer] or Organizations: No    Attends Archivist Meetings: Never    Marital Status: Married  Human resources officer Violence: Not At Risk (07/31/2021)   Humiliation, Afraid, Rape, and Kick questionnaire    Fear of Current or Ex-Partner: No    Emotionally Abused: No    Physically Abused: No    Sexually Abused: No     Review of Systems  Constitutional: Negative.   HENT:  Positive for ear pain (slight) and hearing loss (mild).   Eyes: Negative.   Respiratory: Negative.    Cardiovascular: Negative.   Gastrointestinal: Negative.   Genitourinary: Negative.   Musculoskeletal:  Positive for back pain.  Skin: Negative.   Neurological: Negative.   Psychiatric/Behavioral: Negative.       Objective    BP 126/74   Pulse 84   Temp 97.6 F (36.4 C) (Temporal)   Ht '5\' 5"'$  (1.651 m)   Wt 167 lb 12.8 oz (76.1 kg)   SpO2 96%   BMI 27.92 kg/m   Physical Exam Vitals and nursing note reviewed.  Constitutional:      General: She is not in acute distress.    Appearance: Normal appearance.  HENT:     Head: Normocephalic.     Right Ear: Tympanic membrane, ear canal and external ear normal.     Left Ear: Ear canal and external ear normal. Tympanic membrane is erythematous.  Eyes:     Conjunctiva/sclera: Conjunctivae normal.  Cardiovascular:     Rate and Rhythm: Normal rate and regular rhythm.     Pulses: Normal pulses.     Heart sounds: Normal heart sounds.  Pulmonary:     Effort: Pulmonary effort is normal.     Breath sounds: Normal breath sounds.  Musculoskeletal:     Cervical back: Normal range of motion.  Skin:    General: Skin is warm.     Comments: Wound to left leg, scabbed, no redness  Neurological:     General: No focal deficit present.     Mental Status: She is alert and oriented to person, place, and time.  Psychiatric:        Mood and Affect: Mood normal.        Behavior: Behavior normal.        Thought Content: Thought content normal.        Judgment: Judgment normal.      Assessment & Plan:   Problem List Items Addressed This Visit       Cardiovascular and Mediastinum   Hypertensive disorder    Chronic, stable.  Blood pressure today 126/74.  Continue lisinopril 10 mg daily.  We will check labs at next visit.  Follow-up in 2 months.      Relevant Medications    ezetimibe (ZETIA) 10 MG tablet     Nervous and Auditory   Lumbar radiculopathy, chronic    Chronic, stable.  She can take meloxicam 15 mg daily as needed for pain.  She can also take Tylenol arthritis as needed.  She has started a turmeric-black pepper supplement as well.  Follow-up if symptoms worsen or with any concerns.  Refill of meloxicam sent to the pharmacy.      Relevant Medications   meloxicam (MOBIC) 15 MG tablet     Musculoskeletal and Integument   Osteopenia    DEXA scan on 07/19/2020 showed a T score of -1.2.  Encouraged her to continue her multivitamin and add on a vitamin D 1000 unit supplement daily.  We will check vitamin D levels next visit.      Relevant Medications   meloxicam (MOBIC) 15 MG tablet     Other   Hyperlipidemia    She has not tolerated atorvastatin, simvastatin, pravastatin.  Her cholesterol is elevated and her ASCVD score is 22.6%.  We will have her start Zetia 10 mg daily.  Follow-up in 2 months and we will recheck blood panel.        Relevant Medications   ezetimibe (ZETIA) 10 MG tablet   Hyperglycemia    Her last A1c was 6.0%.  She has been adjusting her diet and increasing her exercise.  We will check A1c at next visit.      Other Visit Diagnoses     Wound of left lower extremity, initial encounter    -  Primary   Wound to her left leg while gardening.  No signs of infection.  We will update Td booster today.   Relevant Orders   Td : Tetanus/diphtheria >7yo Preservative  free (Completed)   Chronic otitis media, unspecified otitis media type       She was diagnosed with ear infection in April, still having symptoms.  We will start Augmentin twice a day for 7 days.   Relevant Medications   amoxicillin-clavulanate (AUGMENTIN) 875-125 MG tablet       Return in about 2 months (around 07/11/2022) for CPE.   Charyl Dancer, NP

## 2022-05-10 ENCOUNTER — Encounter: Payer: Self-pay | Admitting: Nurse Practitioner

## 2022-05-10 ENCOUNTER — Ambulatory Visit (INDEPENDENT_AMBULATORY_CARE_PROVIDER_SITE_OTHER): Payer: Medicare HMO | Admitting: Nurse Practitioner

## 2022-05-10 VITALS — BP 126/74 | HR 84 | Temp 97.6°F | Ht 65.0 in | Wt 167.8 lb

## 2022-05-10 DIAGNOSIS — Z23 Encounter for immunization: Secondary | ICD-10-CM | POA: Diagnosis not present

## 2022-05-10 DIAGNOSIS — R739 Hyperglycemia, unspecified: Secondary | ICD-10-CM

## 2022-05-10 DIAGNOSIS — M5416 Radiculopathy, lumbar region: Secondary | ICD-10-CM | POA: Diagnosis not present

## 2022-05-10 DIAGNOSIS — E782 Mixed hyperlipidemia: Secondary | ICD-10-CM | POA: Diagnosis not present

## 2022-05-10 DIAGNOSIS — I10 Essential (primary) hypertension: Secondary | ICD-10-CM | POA: Diagnosis not present

## 2022-05-10 DIAGNOSIS — H6690 Otitis media, unspecified, unspecified ear: Secondary | ICD-10-CM | POA: Diagnosis not present

## 2022-05-10 DIAGNOSIS — M8589 Other specified disorders of bone density and structure, multiple sites: Secondary | ICD-10-CM | POA: Diagnosis not present

## 2022-05-10 DIAGNOSIS — S81802A Unspecified open wound, left lower leg, initial encounter: Secondary | ICD-10-CM | POA: Diagnosis not present

## 2022-05-10 MED ORDER — EZETIMIBE 10 MG PO TABS: 10.0000 mg | ORAL_TABLET | Freq: Every day | ORAL | 0 refills | Status: DC

## 2022-05-10 MED ORDER — MELOXICAM 15 MG PO TABS: 7.5000 mg | ORAL_TABLET | Freq: Every day | ORAL | 1 refills | Status: DC | PRN

## 2022-05-10 MED ORDER — AMOXICILLIN-POT CLAVULANATE 875-125 MG PO TABS: 1.0000 | ORAL_TABLET | Freq: Two times a day (BID) | ORAL | 0 refills | Status: DC

## 2022-05-10 NOTE — Assessment & Plan Note (Signed)
She has not tolerated atorvastatin, simvastatin, pravastatin.  Her cholesterol is elevated and her ASCVD score is 22.6%.  We will have her start Zetia 10 mg daily.  Follow-up in 2 months and we will recheck blood panel.

## 2022-05-10 NOTE — Assessment & Plan Note (Signed)
DEXA scan on 07/19/2020 showed a T score of -1.2.  Encouraged her to continue her multivitamin and add on a vitamin D 1000 unit supplement daily.  We will check vitamin D levels next visit.

## 2022-05-10 NOTE — Assessment & Plan Note (Signed)
Chronic, stable.  She can take meloxicam 15 mg daily as needed for pain.  She can also take Tylenol arthritis as needed.  She has started a turmeric-black pepper supplement as well.  Follow-up if symptoms worsen or with any concerns.  Refill of meloxicam sent to the pharmacy.

## 2022-05-10 NOTE — Assessment & Plan Note (Signed)
Her last A1c was 6.0%.  She has been adjusting her diet and increasing her exercise.  We will check A1c at next visit.

## 2022-05-10 NOTE — Patient Instructions (Signed)
It was great to see you!  Start zetia 1 tablet daily for your cholesterol.  I have refilled your meloxicam, you can take this every day as needed.   Start augmentin 1 pill twice a day for 7 days for your ear.   Let's follow-up in 2 months, sooner if you have concerns.  If a referral was placed today, you will be contacted for an appointment. Please note that routine referrals can sometimes take up to 3-4 weeks to process. Please call our office if you haven't heard anything after this time frame.  Take care,  Vance Peper, NP

## 2022-05-10 NOTE — Assessment & Plan Note (Signed)
Chronic, stable.  Blood pressure today 126/74.  Continue lisinopril 10 mg daily.  We will check labs at next visit.  Follow-up in 2 months.

## 2022-05-16 DIAGNOSIS — H5213 Myopia, bilateral: Secondary | ICD-10-CM | POA: Diagnosis not present

## 2022-05-16 DIAGNOSIS — H52209 Unspecified astigmatism, unspecified eye: Secondary | ICD-10-CM | POA: Diagnosis not present

## 2022-05-16 DIAGNOSIS — H524 Presbyopia: Secondary | ICD-10-CM | POA: Diagnosis not present

## 2022-06-16 ENCOUNTER — Other Ambulatory Visit: Payer: Self-pay | Admitting: Family Medicine

## 2022-06-16 DIAGNOSIS — R052 Subacute cough: Secondary | ICD-10-CM

## 2022-07-25 ENCOUNTER — Encounter: Payer: Medicare HMO | Admitting: Nurse Practitioner

## 2022-07-29 ENCOUNTER — Encounter: Payer: Self-pay | Admitting: Nurse Practitioner

## 2022-07-29 ENCOUNTER — Ambulatory Visit (INDEPENDENT_AMBULATORY_CARE_PROVIDER_SITE_OTHER): Payer: Medicare HMO | Admitting: Nurse Practitioner

## 2022-07-29 VITALS — BP 130/76 | HR 75 | Temp 96.9°F | Wt 167.6 lb

## 2022-07-29 DIAGNOSIS — Z23 Encounter for immunization: Secondary | ICD-10-CM | POA: Diagnosis not present

## 2022-07-29 DIAGNOSIS — M5416 Radiculopathy, lumbar region: Secondary | ICD-10-CM

## 2022-07-29 DIAGNOSIS — Z0000 Encounter for general adult medical examination without abnormal findings: Secondary | ICD-10-CM | POA: Diagnosis not present

## 2022-07-29 DIAGNOSIS — R7303 Prediabetes: Secondary | ICD-10-CM | POA: Diagnosis not present

## 2022-07-29 DIAGNOSIS — E782 Mixed hyperlipidemia: Secondary | ICD-10-CM

## 2022-07-29 DIAGNOSIS — I10 Essential (primary) hypertension: Secondary | ICD-10-CM | POA: Diagnosis not present

## 2022-07-29 DIAGNOSIS — M8589 Other specified disorders of bone density and structure, multiple sites: Secondary | ICD-10-CM

## 2022-07-29 LAB — LIPID PANEL
Cholesterol: 205 mg/dL — ABNORMAL HIGH (ref 0–200)
HDL: 46.6 mg/dL (ref >39.00)
LDL Cholesterol: 130 mg/dL — ABNORMAL HIGH (ref 0–99)
NonHDL: 158.33
Total CHOL/HDL Ratio: 4
Triglycerides: 143 mg/dL (ref 0.0–149.0)
VLDL: 28.6 mg/dL (ref 0.0–40.0)

## 2022-07-29 LAB — COMPREHENSIVE METABOLIC PANEL
ALT: 16 U/L (ref 0–35)
AST: 21 U/L (ref 0–37)
Albumin: 4.3 g/dL (ref 3.5–5.2)
Alkaline Phosphatase: 39 U/L (ref 39–117)
BUN: 15 mg/dL (ref 6–23)
CO2: 24 mEq/L (ref 19–32)
Calcium: 9.6 mg/dL (ref 8.4–10.5)
Chloride: 107 mEq/L (ref 96–112)
Creatinine, Ser: 0.96 mg/dL (ref 0.40–1.20)
GFR: 57.56 mL/min — ABNORMAL LOW (ref >60.00)
Glucose, Bld: 100 mg/dL — ABNORMAL HIGH (ref 70–99)
Potassium: 4.2 mEq/L (ref 3.5–5.1)
Sodium: 140 mEq/L (ref 135–145)
Total Bilirubin: 0.7 mg/dL (ref 0.2–1.2)
Total Protein: 7.1 g/dL (ref 6.0–8.3)

## 2022-07-29 LAB — CBC
HCT: 37.6 % (ref 36.0–46.0)
Hemoglobin: 12.6 g/dL (ref 12.0–15.0)
MCHC: 33.5 g/dL (ref 30.0–36.0)
MCV: 92.4 fl (ref 78.0–100.0)
Platelets: 206 10*3/uL (ref 150.0–400.0)
RBC: 4.07 Mil/uL (ref 3.87–5.11)
RDW: 12.6 % (ref 11.5–15.5)
WBC: 4.8 10*3/uL (ref 4.0–10.5)

## 2022-07-29 LAB — VITAMIN D 25 HYDROXY (VIT D DEFICIENCY, FRACTURES): VITD: 58.36 ng/mL (ref 30.00–100.00)

## 2022-07-29 LAB — HEMOGLOBIN A1C: Hgb A1c MFr Bld: 6.1 % (ref 4.6–6.5)

## 2022-07-29 MED ORDER — LISINOPRIL 10 MG PO TABS: 10.0000 mg | ORAL_TABLET | Freq: Every day | ORAL | 3 refills | Status: DC

## 2022-07-29 MED ORDER — MELOXICAM 15 MG PO TABS: 7.5000 mg | ORAL_TABLET | Freq: Every day | ORAL | 1 refills | Status: DC | PRN

## 2022-07-29 MED ORDER — BACLOFEN 10 MG PO TABS: 10.0000 mg | ORAL_TABLET | Freq: Three times a day (TID) | ORAL | 1 refills | Status: DC | PRN

## 2022-07-29 NOTE — Patient Instructions (Signed)
It was great to see you!  We are checking your labs today and will let you know the results via mychart/phone.   Let's follow-up in 6 months, sooner if you have concerns.  If a referral was placed today, you will be contacted for an appointment. Please note that routine referrals can sometimes take up to 3-4 weeks to process. Please call our office if you haven't heard anything after this time frame.  Take care,  Kellis Mcadam, NP  

## 2022-07-29 NOTE — Assessment & Plan Note (Signed)
Chronic, stable.  BP today 130/76.  Continue lisinopril 10 mg daily, refill sent to the pharmacy.  Check CMP, CBC.  Follow-up 6 months

## 2022-07-29 NOTE — Assessment & Plan Note (Signed)
Chronic, stable.  She is having ongoing intermittent back pain with spasms, especially at night.  This causes her to get 3 to 4 hours of sleep every night.  She tried methocarbamol and tizanidine for muscle spasms, however they did not help as much as the baclofen.  She would like a refill of this.  Baclofen 10 mg every 8 hours as needed refill sent to the pharmacy.

## 2022-07-29 NOTE — Assessment & Plan Note (Signed)
Last DEXA scan showed a T score of -1.2 on 07/19/2020.  We will repeat DEXA in 2026.

## 2022-07-29 NOTE — Assessment & Plan Note (Signed)
We will check A1c today.

## 2022-07-29 NOTE — Assessment & Plan Note (Signed)
She did not tolerate statins, the Zetia is causing a little bit of constipation.  She would like to see how her cholesterol levels are doing before stopping the medication.

## 2022-07-29 NOTE — Progress Notes (Signed)
BP 130/76   Pulse 75   Temp (!) 96.9 F (36.1 C) (Temporal)   Wt 167 lb 9.6 oz (76 kg)   SpO2 94%   BMI 27.89 kg/m    Subjective:    Patient ID: [name], female    DOB: 25-Oct-1945, 76 y.o.   MRN: [id number]  CC: Chief Complaint  Patient presents with   Annual Exam    CPE, pt is fasting.    HPI: [name] is a 76 y.o. female presenting on 07/29/2022 for comprehensive medical examination. Current medical complaints include:none  She currently lives with: husband  Depression Screen done today and results listed below:     07/29/2022    8:33 AM 05/10/2022    9:29 AM 03/21/2022    1:43 PM 07/31/2021   12:59 PM 07/12/2021   11:03 AM  Depression screen PHQ 2/9  Decreased Interest 0 0 0 0 0  Down, Depressed, Hopeless 0 0 0 0 0  PHQ - 2 Score 0 0 0 0 0  Altered sleeping 2    1  Tired, decreased energy '1   1 1  '$ Change in appetite 0   1 0  Feeling Hogan or failure about yourself  0   0 0  Trouble concentrating 0   0 0  Moving slowly or fidgety/restless 0   0 0  Suicidal thoughts 0   0 0  PHQ-9 Score 3    2  Difficult doing work/chores Not difficult at all   Not difficult at all Not difficult at all    The patient does not have a history of falls. I did not complete a risk assessment for falls. A plan of care for falls was not documented.   Past Medical History:  Past Medical History:  Diagnosis Date   Arthritis    Cancer ([place]) 01/07/2006   Breast cancer- L Breast   Cataract    Complication of anesthesia    Hypertension    Injury of ligament of hand    right thumb ulnar collateral ligament injury.  recommend MR arthrogram of the thumb.   Neuromuscular disorder ([place])    scoliosis s/p spine surgery, has arthritis and pinched nerves   PONV (postoperative nausea and vomiting)    Scoliosis     Surgical History:  Past Surgical History:  Procedure Laterality Date   ABDOMINAL HYSTERECTOMY  2002   have ovaries   BREAST SURGERY  2006   left, cancer   EYE SURGERY   2012   KNEE SURGERY Left    SPINE SURGERY  1981 and 2007   TOTAL KNEE ARTHROPLASTY Right 03/07/2020   Procedure: TOTAL KNEE ARTHROPLASTY;  Surgeon: Paralee Cancel, MD;  Location: [facility];  Service: Orthopedics;  Laterality: Right;  70 mins   TUBAL LIGATION      Medications:  Current Outpatient Medications on File Prior to Visit  Medication Sig   acetaminophen (TYLENOL) 650 MG CR tablet Tylenol Arthritis Pain 650 mg tablet,extended release   1 tablet as needed by oral route.   aspirin 81 MG EC tablet Adult Low Dose Aspirin 81 mg tablet,delayed release   1 tablet every day by oral route.   Black Pepper-Turmeric 3-500 MG CAPS    Camphor-Menthol-Methyl Sal (SALONPAS) 3.10-26-08 % PTCH 1 patch as needed by topical route.   Cholecalciferol (VITAMIN D) 125 MCG (5000 UT) CAPS Take 5,000 Units by mouth daily.    ezetimibe (ZETIA) 10 MG tablet Take 1 tablet (10 mg  total) by mouth daily.   Multiple Vitamin (MULTIVITAMIN) tablet Take 1 tablet by mouth daily.   Omega-3 Fatty Acids (FISH OIL) 1000 MG CAPS Take 2,000 mg by mouth daily.   No current facility-administered medications on file prior to visit.    Allergies:  Allergies  Allergen Reactions   Hydrocodone-Acetaminophen Itching and Hives    Hives, itching Hives, itching    Albuterol Other (See Comments) and Palpitations    Heart race, tremble and studdering Heart race, tremble and studdering Heart race, tremble and studdering   Codeine Other (See Comments)    DIZZINESS Confusion and dizziness DIZZINESS Confusion/hallucinations/dizziness Confusion/hallucinations/dizziness    Pravastatin Sodium Other (See Comments) and Nausea And Vomiting    Per patient caused headaches and GI upset Per patient caused headaches and GI upset Per patient caused headaches and GI upset     Social History:  Social History   Socioeconomic History   Marital status: Married    Spouse name: Not on file   Number of children: Not on file   Years of  education: Not on file   Highest education level: Not on file  Occupational History   Occupation: Retired  Tobacco Use   Smoking status: Never   Smokeless tobacco: Never  Vaping Use   Vaping Use: Never used  Substance and Sexual Activity   Alcohol use: No   Drug use: No   Sexual activity: Yes  Other Topics Concern   Not on file  Social History Narrative   Married   Education: High School   Exercise: two times a week   Social Determinants of Health   Financial Resource Strain: Low Risk  (07/31/2021)   Overall Financial Resource Strain (CARDIA)    Difficulty of Paying Living Expenses: Not hard at all  Food Insecurity: No Food Insecurity (07/31/2021)   Hunger Vital Sign    Worried About Running Out of Food in the Last Year: Never true    Ran Out of Food in the Last Year: Never true  Transportation Needs: No Transportation Needs (07/31/2021)   PRAPARE - Hydrologist (Medical): No    Lack of Transportation (Non-Medical): No  Physical Activity: Insufficiently Active (07/31/2021)   Exercise Vital Sign    Days of Exercise per Week: 3 days    Minutes of Exercise per Session: 30 min  Stress: No Stress Concern Present (07/31/2021)   [place]    Feeling of Stress : Not at all  Social Connections: Moderately Integrated (07/31/2021)   Social Connection and Isolation Panel [NHANES]    Frequency of Communication with Friends and Family: More than three times a week    Frequency of Social Gatherings with Friends and Family: More than three times a week    Attends Religious Services: More than 4 times per year    Active Member of [employer] or Organizations: No    Attends Archivist Meetings: Never    Marital Status: Married  Human resources officer Violence: Not At Risk (07/31/2021)   Humiliation, Afraid, Rape, and Kick questionnaire    Fear of Current or Ex-Partner: No    Emotionally  Abused: No    Physically Abused: No    Sexually Abused: No   Social History   Tobacco Use  Smoking Status Never  Smokeless Tobacco Never   Social History   Substance and Sexual Activity  Alcohol Use No    Family History:  Family History  Problem Relation Age of Onset   Diabetes Mother        age early 79's   Heart disease Mother        age early 35's or 45's   Hypertension Mother    Hyperlipidemia Mother    Stroke Father 40       per pt mini   Diabetes Sister 55       type II   Hyperlipidemia Sister    Hypertension Sister    Kidney disease Brother        polycystic kidney   Diabetes Brother    Heart disease Brother    Alcohol abuse Maternal Grandfather    Cancer Maternal Grandmother        Leukemia   Cancer Paternal Aunt 58       Breast    Past medical history, surgical history, medications, allergies, family history and social history reviewed with patient today and changes made to appropriate areas of the chart.   Review of Systems  Constitutional: Negative.   HENT: Negative.    Eyes: Negative.   Respiratory: Negative.    Cardiovascular: Negative.   Gastrointestinal: Negative.   Genitourinary: Negative.   Musculoskeletal:  Positive for back pain and joint pain.  Skin: Negative.   Neurological: Negative.   Psychiatric/Behavioral: Negative.     All other ROS negative except what is listed above and in the HPI.      Objective:    BP 130/76   Pulse 75   Temp (!) 96.9 F (36.1 C) (Temporal)   Wt 167 lb 9.6 oz (76 kg)   SpO2 94%   BMI 27.89 kg/m   Wt Readings from Last 3 Encounters:  07/29/22 167 lb 9.6 oz (76 kg)  05/10/22 167 lb 12.8 oz (76.1 kg)  03/21/22 168 lb (76.2 kg)    Physical Exam Vitals and nursing note reviewed.  Constitutional:      General: She is not in acute distress.    Appearance: Normal appearance.  HENT:     Head: Normocephalic and atraumatic.     Right Ear: Tympanic membrane, ear canal and external ear normal.      Left Ear: Tympanic membrane, ear canal and external ear normal.  Eyes:     Conjunctiva/sclera: Conjunctivae normal.  Cardiovascular:     Rate and Rhythm: Normal rate and regular rhythm.     Pulses: Normal pulses.     Heart sounds: Normal heart sounds.  Pulmonary:     Effort: Pulmonary effort is normal.     Breath sounds: Normal breath sounds.  Abdominal:     Palpations: Abdomen is soft.     Tenderness: There is no abdominal tenderness.  Musculoskeletal:        General: Normal range of motion.     Cervical back: Normal range of motion and neck supple.     Right lower leg: No edema.     Left lower leg: No edema.  Lymphadenopathy:     Cervical: No cervical adenopathy.  Skin:    General: Skin is warm and dry.  Neurological:     General: No focal deficit present.     Mental Status: She is alert and oriented to person, place, and time.     Cranial Nerves: No cranial nerve deficit.     Coordination: Coordination normal.     Gait: Gait normal.  Psychiatric:        Mood and Affect: Mood normal.  Behavior: Behavior normal.        Thought Content: Thought content normal.        Judgment: Judgment normal.     Results for orders placed or performed in visit on 03/21/22  POC SOFIA Antigen FIA  Result Value Ref Range   SARS Coronavirus 2 Ag Negative Negative  POCT rapid strep A  Result Value Ref Range   Rapid Strep A Screen Negative Negative      Assessment & Plan:   Problem List Items Addressed This Visit       Cardiovascular and Mediastinum   Hypertensive disorder    Chronic, stable.  BP today 130/76.  Continue lisinopril 10 mg daily, refill sent to the pharmacy.  Check CMP, CBC.  Follow-up 6 months      Relevant Medications   lisinopril (ZESTRIL) 10 MG tablet   Other Relevant Orders   CBC   Comprehensive metabolic panel     Nervous and Auditory   Lumbar radiculopathy, chronic    Chronic, stable.  She is having ongoing intermittent back pain with spasms,  especially at night.  This causes her to get 3 to 4 hours of sleep every night.  She tried methocarbamol and tizanidine for muscle spasms, however they did not help as much as the baclofen.  She would like a refill of this.  Baclofen 10 mg every 8 hours as needed refill sent to the pharmacy.      Relevant Medications   baclofen (LIORESAL) 10 MG tablet   meloxicam (MOBIC) 15 MG tablet     Musculoskeletal and Integument   Osteopenia    Last DEXA scan showed a T score of -1.2 on 07/19/2020.  We will repeat DEXA in 2026.      Relevant Medications   meloxicam (MOBIC) 15 MG tablet   Other Relevant Orders   VITAMIN D 25 Hydroxy (Vit-D Deficiency, Fractures)     Other   Hyperlipidemia    She did not tolerate statins, the Zetia is causing a little bit of constipation.  She would like to see how her cholesterol levels are doing before stopping the medication.      Relevant Medications   lisinopril (ZESTRIL) 10 MG tablet   Other Relevant Orders   Lipid panel   Prediabetes    We will check A1c today.      Relevant Orders   Comprehensive metabolic panel   Hemoglobin A1c   Other Visit Diagnoses     Routine general medical examination at a health care facility    -  Primary   Health maintenance reviewed and updated. Flu vaccine given today. Mammogram scheduled in 2 weeks. Check CMP, CBC. Follow-up 1 year   Need for influenza vaccination       Flu vaccine given today   Relevant Orders   Flu Vaccine QUAD High Dose(Fluad) (Completed)        Follow up plan: Return in about 6 months (around 01/28/2023) for HTN, HLD.   LABORATORY TESTING:  - Pap smear: not applicable  IMMUNIZATIONS:   - Tdap: Tetanus vaccination status reviewed: last tetanus booster within 10 years. - Influenza: Administered today - Pneumovax: Up to date - Prevnar: Up to date - HPV: Not applicable - Zostavax vaccine: Refused  SCREENING: -Mammogram:  scheduled    - Colonoscopy: Not applicable  - Bone Density:  Up to date  -Hearing Test: Not applicable  -Spirometry: Not applicable   PATIENT COUNSELING:   Advised to take 1 mg of folate  supplement per day if capable of pregnancy.   Sexuality: Discussed sexually transmitted diseases, partner selection, use of condoms, avoidance of unintended pregnancy  and contraceptive alternatives.   Advised to avoid cigarette smoking.  I discussed with the patient that most people either abstain from alcohol or drink within safe limits (<=14/week and <=4 drinks/occasion for males, <=7/weeks and <= 3 drinks/occasion for females) and that the risk for alcohol disorders and other health effects rises proportionally with the number of drinks per week and how often a drinker exceeds daily limits.  Discussed cessation/primary prevention of drug use and availability of treatment for abuse.   Diet: Encouraged to adjust caloric intake to maintain  or achieve ideal body weight, to reduce intake of dietary saturated fat and total fat, to limit sodium intake by avoiding high sodium foods and not adding table salt, and to maintain adequate dietary potassium and calcium preferably from fresh fruits, vegetables, and low-fat dairy products.    stressed the importance of regular exercise  Injury prevention: Discussed safety belts, safety helmets, smoke detector, smoking near bedding or upholstery.   Dental health: Discussed importance of regular tooth brushing, flossing, and dental visits.    NEXT PREVENTATIVE PHYSICAL DUE IN 1 YEAR. Return in about 6 months (around 01/28/2023) for HTN, HLD.

## 2022-07-30 ENCOUNTER — Other Ambulatory Visit: Payer: Self-pay | Admitting: Nurse Practitioner

## 2022-08-08 ENCOUNTER — Telehealth: Payer: Self-pay | Admitting: Nurse Practitioner

## 2022-08-08 NOTE — Telephone Encounter (Signed)
Left message for patient to call back and schedule Medicare Annual Wellness Visit (AWV).   Please offer to do virtually or by telephone.  Left office number and my jabber [phone].  Last AWV:07/31/2021  Please schedule at anytime with Nurse Health Advisor.

## 2022-08-14 DIAGNOSIS — Z1231 Encounter for screening mammogram for malignant neoplasm of breast: Secondary | ICD-10-CM | POA: Diagnosis not present

## 2022-08-14 LAB — HM MAMMOGRAPHY

## 2022-08-16 ENCOUNTER — Encounter: Payer: Self-pay | Admitting: Nurse Practitioner

## 2022-08-21 ENCOUNTER — Ambulatory Visit (INDEPENDENT_AMBULATORY_CARE_PROVIDER_SITE_OTHER): Payer: Medicare HMO

## 2022-08-21 VITALS — Ht 65.0 in | Wt 167.0 lb

## 2022-08-21 DIAGNOSIS — Z0000 Encounter for general adult medical examination without abnormal findings: Secondary | ICD-10-CM

## 2022-08-21 NOTE — Progress Notes (Signed)
Subjective:   [name] is a 76 y.o. female who presents for Medicare Annual (Subsequent) preventive examination. I connected with  [name] on 08/21/22 by a audio enabled telemedicine application and verified that I am speaking with the correct person using two identifiers.  Patient Location: Home  Provider Location: Home Office  I discussed the limitations of evaluation and management by telemedicine. The patient expressed understanding and agreed to proceed.  Review of Systems     Cardiac Risk Factors include: advanced age (>69mn, >>55women)     Objective:    Today's Vitals   08/21/22 1442  Weight: 167 lb (75.8 kg)  Height: '5\' 5"'$  (1.651 m)   Body mass index is 27.79 kg/m.     07/31/2021   12:50 PM 07/25/2020    8:21 AM 03/07/2020    3:00 PM 02/29/2020   10:26 AM 03/04/2017   10:21 AM  Advanced Directives  Does Patient Have a Medical Advance Directive? No Yes Yes Yes Yes  Type of [place]ate [place]  Does patient want to make changes to medical advance directive?   No - Patient declined No - Patient declined   Copy of [place]?  No - copy requested Yes - validated most recent copy scanned in chart (See row information) No - copy requested   Would patient like information on creating a medical advance directive? No - Patient declined        Current Medications (verified) Outpatient Encounter Medications as of 08/21/2022  Medication Sig   acetaminophen (TYLENOL) 650 MG CR tablet Tylenol Arthritis Pain 650 mg tablet,extended release   1 tablet as needed by oral route.   aspirin 81 MG EC tablet Adult Low Dose Aspirin 81 mg tablet,delayed release   1 tablet every day by oral route.   baclofen (LIORESAL) 10 MG tablet Take 1 tablet (10 mg total) by mouth 3 (three) times daily as needed for muscle spasms.   Black Pepper-Turmeric  3-500 MG CAPS    Camphor-Menthol-Methyl Sal (SALONPAS) 3.10-26-08 % PTCH 1 patch as needed by topical route.   Cholecalciferol (VITAMIN D) 125 MCG (5000 UT) CAPS Take 5,000 Units by mouth daily.    ezetimibe (ZETIA) 10 MG tablet TAKE 1 TABLET BY MOUTH EVERY DAY   lisinopril (ZESTRIL) 10 MG tablet Take 1 tablet (10 mg total) by mouth daily.   meloxicam (MOBIC) 15 MG tablet Take 0.5-1 tablets (7.5-15 mg total) by mouth daily as needed for pain. With food   Multiple Vitamin (MULTIVITAMIN) tablet Take 1 tablet by mouth daily.   Omega-3 Fatty Acids (FISH OIL) 1000 MG CAPS Take 2,000 mg by mouth daily.   No facility-administered encounter medications on file as of 08/21/2022.    Allergies (verified) Hydrocodone-acetaminophen, Albuterol, Codeine, and Pravastatin sodium   History: Past Medical History:  Diagnosis Date   Arthritis    Cancer ([place] 01/07/2006   Breast cancer- L Breast   Cataract    Complication of anesthesia    Hypertension    Injury of ligament of hand    right thumb ulnar collateral ligament injury.  recommend MR arthrogram of the thumb.   Neuromuscular disorder ([place]    scoliosis s/p spine surgery, has arthritis and pinched nerves   PONV (postoperative nausea and vomiting)    Scoliosis    Past Surgical History:  Procedure Laterality Date   ABDOMINAL HYSTERECTOMY  2002   have ovaries   BREAST SURGERY  2006   left, cancer   EYE SURGERY  2012   KNEE SURGERY Left    SPINE SURGERY  1981 and 2007   TOTAL KNEE ARTHROPLASTY Right 03/07/2020   Procedure: TOTAL KNEE ARTHROPLASTY;  Surgeon: Paralee Cancel, MD;  Location: [facility];  Service: Orthopedics;  Laterality: Right;  70 mins   TUBAL LIGATION     Family History  Problem Relation Age of Onset   Diabetes Mother        age early 35's   Heart disease Mother        age early 80's or 47's   Hypertension Mother    Hyperlipidemia Mother    Stroke Father 21       per pt mini   Diabetes Sister 63       type II    Hyperlipidemia Sister    Hypertension Sister    Kidney disease Brother        polycystic kidney   Diabetes Brother    Heart disease Brother    Alcohol abuse Maternal Grandfather    Cancer Maternal Grandmother        Leukemia   Cancer Paternal Aunt 52       Breast   Social History   Socioeconomic History   Marital status: Married    Spouse name: Not on file   Number of children: Not on file   Years of education: Not on file   Highest education level: Not on file  Occupational History   Occupation: Retired  Tobacco Use   Smoking status: Never   Smokeless tobacco: Never  Vaping Use   Vaping Use: Never used  Substance and Sexual Activity   Alcohol use: No   Drug use: No   Sexual activity: Yes  Other Topics Concern   Not on file  Social History Narrative   Married   Education: High School   Exercise: two times a week   Social Determinants of Health   Financial Resource Strain: Low Risk  (08/21/2022)   Overall Financial Resource Strain (CARDIA)    Difficulty of Paying Living Expenses: Not hard at all  Food Insecurity: No Food Insecurity (08/21/2022)   Hunger Vital Sign    Worried About Running Out of Food in the Last Year: Never true    Ran Out of Food in the Last Year: Never true  Transportation Needs: No Transportation Needs (08/21/2022)   PRAPARE - Hydrologist (Medical): No    Lack of Transportation (Non-Medical): No  Physical Activity: Sufficiently Active (08/21/2022)   Exercise Vital Sign    Days of Exercise per Week: 4 days    Minutes of Exercise per Session: 60 min  Stress: No Stress Concern Present (08/21/2022)   [place]    Feeling of Stress : Not at all  Social Connections: Moderately Integrated (08/21/2022)   Social Connection and Isolation Panel [NHANES]    Frequency of Communication with Friends and Family: More than three times a week    Frequency of Social  Gatherings with Friends and Family: More than three times a week    Attends Religious Services: More than 4 times per year    Active Member of [employer] or Organizations: No    Attends Archivist Meetings: Never    Marital Status: Married    Tobacco Counseling Counseling given: Not Answered   Clinical Intake:  Pre-visit preparation completed: Yes  Pain : No/denies pain     Nutritional Risks: None Diabetes: No  How often do you need to have someone help you when you read instructions, pamphlets, or other written materials from your doctor or pharmacy?: 1 - Never  Diabetic?no   Interpreter Needed?: No  Information entered by :: Jadene Pierini, LPN   Activities of Daily Living    08/21/2022    2:46 PM  In your present state of health, do you have any difficulty performing the following activities:  Hearing? 0  Vision? 0  Difficulty concentrating or making decisions? 0  Walking or climbing stairs? 0  Dressing or bathing? 0  Doing errands, shopping? 0  Preparing Food and eating ? N  Using the Toilet? N  In the past six months, have you accidently leaked urine? N  Do you have problems with loss of bowel control? N  Managing your Medications? N  Managing your Finances? N  Housekeeping or managing your Housekeeping? N    Patient Care Team: Charyl Dancer, NP as PCP - General (Internal Medicine) Wonda Horner, MD as Consulting Physician (Gastroenterology) Zonia Kief, MD as Attending Physician (Rehabilitation)  Indicate any recent Medical Services you may have received from other than [facility] providers in the past year (date may be approximate).     Assessment:   This is a routine wellness examination for [place].  Hearing/Vision screen Vision Screening - Comments:: Annual eye exams wear glasses   Dietary issues and exercise activities discussed: Current Exercise Habits: Home exercise routine, Type of exercise: walking, Time (Minutes): 60, Frequency  (Times/Week): 4, Weekly Exercise (Minutes/Week): 240, Intensity: Mild, Exercise limited by: None identified   Goals Addressed             This Visit's Progress    Patient Stated   On track    Would like to lose 10 pounds & be more active       Depression Screen    08/21/2022    2:45 PM 07/29/2022    8:33 AM 05/10/2022    9:29 AM 03/21/2022    1:43 PM 07/31/2021   12:59 PM 07/12/2021   11:03 AM 07/25/2020    8:24 AM  PHQ 2/9 Scores  PHQ - 2 Score 0 0 0 0 0 0 0  PHQ- 9 Score 0 3    2     Fall Risk    08/21/2022    2:44 PM 03/21/2022    1:43 PM 07/31/2021   12:50 PM 07/12/2021    8:50 AM 07/25/2020    8:23 AM  [place] in the past year? 0 0 0 0 1  Number falls in past yr: 0 0 0 0 0  Injury with Fall? 0 0 0 0 1  Risk for fall due to : No Fall Risks   History of fall(s);Orthopedic patient History of fall(s)  Follow up Falls prevention discussed  Falls evaluation completed;Falls prevention discussed Falls evaluation completed;Education provided;Falls prevention discussed Falls prevention discussed    FALL RISK PREVENTION PERTAINING TO THE HOME:  Any stairs in or around the home? Yes  If so, are there any without handrails? Yes  Home free of loose throw rugs in walkways, pet beds, electrical cords, etc? Yes  Adequate lighting in your home to reduce risk of falls? Yes   ASSISTIVE DEVICES UTILIZED TO PREVENT FALLS:  Life alert? No  Use of a cane, walker or w/c? No  Grab bars  in the bathroom? No  Shower chair or bench in shower? No  Elevated toilet seat or a handicapped toilet? No        08/21/2022    2:47 PM  6CIT Screen  What Year? 0 points  What month? 0 points  What time? 0 points  Count back from 20 0 points  Months in reverse 0 points  Repeat phrase 0 points  Total Score 0 points    Immunizations Immunization History  Administered Date(s) Administered   Fluad Quad(high Dose 65+) 07/07/2020, 07/12/2021, 07/29/2022   Hepatitis B 09/18/2011,  02/10/2012   Influenza, High Dose Seasonal PF 07/27/2014, 07/13/2015, 07/30/2016, 07/29/2017, 07/27/2018   Influenza,inj,Quad PF,6+ Mos 08/02/2019   Influenza-Unspecified 06/21/2013, 07/22/2015   MODERNA COVID-19 SARS-COV-2 PEDS BIVALENT BOOSTER 6Y-11Y 02/28/2021   Moderna SARS-COV2 Booster Vaccination 08/02/2021   Moderna Sars-Covid-2 Vaccination 11/05/2019, 11/10/2019, 12/06/2019, 12/08/2019   Pneumococcal Conjugate-13 01/09/2015   Pneumococcal Polysaccharide-23 08/02/2011   Pneumococcal-Unspecified 08/02/2011   Td 05/10/2022   Tdap 07/22/2011   Zoster, Live 08/21/2010    TDAP status: Due, Education has been provided regarding the importance of this vaccine. Advised may receive this vaccine at local pharmacy or Health Dept. Aware to provide a copy of the vaccination record if obtained from local pharmacy or Health Dept. Verbalized acceptance and understanding.  Flu Vaccine status: Up to date  Pneumococcal vaccine status: Up to date  Covid-19 vaccine status: Completed vaccines  Qualifies for Shingles Vaccine? Yes   Zostavax completed No   Shingrix Completed?: No.    Education has been provided regarding the importance of this vaccine. Patient has been advised to call insurance company to determine out of pocket expense if they have not yet received this vaccine. Advised may also receive vaccine at local pharmacy or Health Dept. Verbalized acceptance and understanding.  Screening Tests Health Maintenance  Topic Date Due   Zoster Vaccines- Shingrix (1 of 2) Never done   COVID-19 Vaccine (6 - Moderna series) 11/20/2022 (Originally 12/03/2021)   Medicare Annual Wellness (AWV)  08/22/2023   TETANUS/TDAP  05/10/2032   Pneumonia Vaccine 90+ Years old  Completed   INFLUENZA VACCINE  Completed   DEXA SCAN  Completed   Hepatitis C Screening  Completed   HPV VACCINES  Aged Out   COLONOSCOPY (Pts 45-22yr Insurance coverage will need to be confirmed)  Discontinued    Health  Maintenance  Health Maintenance Due  Topic Date Due   Zoster Vaccines- Shingrix (1 of 2) Never done    Colorectal cancer screening: No longer required.   Mammogram status: No longer required due to age.  Bone Density status: Completed 07/19/2020. Results reflect: Bone density results: OSTEOPENIA. Repeat every 5 years.  Lung Cancer Screening: (Low Dose CT Chest recommended if Age 76-80years, 30 pack-year currently smoking OR have quit w/in 15years.) does not qualify.   Lung Cancer Screening Referral: n/a  Additional Screening:  Hepatitis C Screening: does not qualify;   Vision Screening: Recommended annual ophthalmology exams for early detection of glaucoma and other disorders of the eye. Is the patient up to date with their annual eye exam?  Yes  Who is the provider or what is the name of the office in which the patient attends annual eye exams? [facility]  If pt is not established with a provider, would they like to be referred to a provider to establish care? No .   Dental Screening: Recommended annual dental exams for proper oral hygiene  Community Resource Referral / Chronic Care  Management: CRR required this visit?  No   CCM required this visit?  No      Plan:     I have personally reviewed and noted the following in the patient's chart:   Medical and social history Use of alcohol, tobacco or illicit drugs  Current medications and supplements including opioid prescriptions. Patient is not currently taking opioid prescriptions. Functional ability and status Nutritional status Physical activity Advanced directives List of other physicians Hospitalizations, surgeries, and ER visits in previous 12 months Vitals Screenings to include cognitive, depression, and falls Referrals and appointments  In addition, I have reviewed and discussed with patient certain preventive protocols, quality metrics, and best practice recommendations. A written personalized care plan for  preventive services as well as general preventive health recommendations were provided to patient.     Daphane Shepherd, LPN   [id number]   Nurse Notes: Due Tdap Vaccine

## 2022-08-21 NOTE — Patient Instructions (Signed)
[name] , Thank you for taking time to come for your Medicare Wellness Visit. I appreciate your ongoing commitment to your health goals. Please review the following plan we discussed and let me know if I can assist you in the future.   These are the goals we discussed:  Goals      Patient Stated     Would like to lose 10 pounds & be more active     Patient Stated     Would like to get sugar and cholesterol down        This is a list of the screening recommended for you and due dates:  Health Maintenance  Topic Date Due   Zoster (Shingles) Vaccine (1 of 2) Never done   COVID-19 Vaccine (6 - Moderna series) 11/20/2022*   Medicare Annual Wellness Visit  08/22/2023   Tetanus Vaccine  05/10/2032   Pneumonia Vaccine  Completed   Flu Shot  Completed   DEXA scan (bone density measurement)  Completed   Hepatitis C Screening: USPSTF Recommendation to screen - Ages 57-79 yo.  Completed   HPV Vaccine  Aged Out   Colon Cancer Screening  Discontinued  *Topic was postponed. The date shown is not the original due date.    Advanced directives: Please bring a copy of your health care power of attorney and living will to the office to be added to your chart at your convenience.   Conditions/risks identified: Aim for 30 minutes of exercise or brisk walking, 6-8 glasses of water, and 5 servings of fruits and vegetables each day.   Next appointment: Follow up in one year for your annual wellness visit    Preventive Care 65 Years and Older, Female Preventive care refers to lifestyle choices and visits with your health care provider that can promote health and wellness. What does preventive care include? A yearly physical exam. This is also called an annual well check. Dental exams once or twice a year. Routine eye exams. Ask your health care provider how often you should have your eyes checked. Personal lifestyle choices, including: Daily care of your teeth and gums. Regular physical  activity. Eating a healthy diet. Avoiding tobacco and drug use. Limiting alcohol use. Practicing safe sex. Taking low-dose aspirin every day. Taking vitamin and mineral supplements as recommended by your health care provider. What happens during an annual well check? The services and screenings done by your health care provider during your annual well check will depend on your age, overall health, lifestyle risk factors, and family history of disease. Counseling  Your health care provider may ask you questions about your: Alcohol use. Tobacco use. Drug use. Emotional well-being. Home and relationship well-being. Sexual activity. Eating habits. History of falls. Memory and ability to understand (cognition). Work and work Statistician. Reproductive health. Screening  You may have the following tests or measurements: Height, weight, and BMI. Blood pressure. Lipid and cholesterol levels. These may be checked every 5 years, or more frequently if you are over 12 years old. Skin check. Lung cancer screening. You may have this screening every year starting at age 51 if you have a 30-pack-year history of smoking and currently smoke or have quit within the past 15 years. Fecal occult blood test (FOBT) of the stool. You may have this test every year starting at age 59. Flexible sigmoidoscopy or colonoscopy. You may have a sigmoidoscopy every 5 years or a colonoscopy every 10 years starting at age 95. Hepatitis C blood test. Hepatitis  B blood test. Sexually transmitted disease (STD) testing. Diabetes screening. This is done by checking your blood sugar (glucose) after you have not eaten for a while (fasting). You may have this done every 1-3 years. Bone density scan. This is done to screen for osteoporosis. You may have this done starting at age 63. Mammogram. This may be done every 1-2 years. Talk to your health care provider about how often you should have regular mammograms. Talk with your  health care provider about your test results, treatment options, and if necessary, the need for more tests. Vaccines  Your health care provider may recommend certain vaccines, such as: Influenza vaccine. This is recommended every year. Tetanus, diphtheria, and acellular pertussis (Tdap, Td) vaccine. You may need a Td booster every 10 years. Zoster vaccine. You may need this after age 65. Pneumococcal 13-valent conjugate (PCV13) vaccine. One dose is recommended after age 36. Pneumococcal polysaccharide (PPSV23) vaccine. One dose is recommended after age 20. Talk to your health care provider about which screenings and vaccines you need and how often you need them. This information is not intended to replace advice given to you by your health care provider. Make sure you discuss any questions you have with your health care provider. Document Released: 11/03/2015 Document Revised: 06/26/2016 Document Reviewed: 08/08/2015 Elsevier Interactive Patient Education  2017 [place] Prevention in the Home Falls can cause injuries. They can happen to people of all ages. There are many things you can do to make your home safe and to help prevent falls. What can I do on the outside of my home? Regularly fix the edges of walkways and driveways and fix any cracks. Remove anything that might make you trip as you walk through a door, such as a raised step or threshold. Trim any bushes or trees on the path to your home. Use bright outdoor lighting. Clear any walking paths of anything that might make someone trip, such as rocks or tools. Regularly check to see if handrails are loose or broken. Make sure that both sides of any steps have handrails. Any raised decks and porches should have guardrails on the edges. Have any leaves, snow, or ice cleared regularly. Use sand or salt on walking paths during winter. Clean up any spills in your garage right away. This includes oil or grease spills. What can I  do in the bathroom? Use night lights. Install grab bars by the toilet and in the tub and shower. Do not use towel bars as grab bars. Use non-skid mats or decals in the tub or shower. If you need to sit down in the shower, use a plastic, non-slip stool. Keep the floor dry. Clean up any water that spills on the floor as soon as it happens. Remove soap buildup in the tub or shower regularly. Attach bath mats securely with double-sided non-slip rug tape. Do not have throw rugs and other things on the floor that can make you trip. What can I do in the bedroom? Use night lights. Make sure that you have a light by your bed that is easy to reach. Do not use any sheets or blankets that are too big for your bed. They should not hang down onto the floor. Have a firm chair that has side arms. You can use this for support while you get dressed. Do not have throw rugs and other things on the floor that can make you trip. What can I do in the kitchen? Clean up any  spills right away. Avoid walking on wet floors. Keep items that you use a lot in easy-to-reach places. If you need to reach something above you, use a strong step stool that has a grab bar. Keep electrical cords out of the way. Do not use floor polish or wax that makes floors slippery. If you must use wax, use non-skid floor wax. Do not have throw rugs and other things on the floor that can make you trip. What can I do with my stairs? Do not leave any items on the stairs. Make sure that there are handrails on both sides of the stairs and use them. Fix handrails that are broken or loose. Make sure that handrails are as long as the stairways. Check any carpeting to make sure that it is firmly attached to the stairs. Fix any carpet that is loose or worn. Avoid having throw rugs at the top or bottom of the stairs. If you do have throw rugs, attach them to the floor with carpet tape. Make sure that you have a light switch at the top of the stairs  and the bottom of the stairs. If you do not have them, ask someone to add them for you. What else can I do to help prevent falls? Wear shoes that: Do not have high heels. Have rubber bottoms. Are comfortable and fit you well. Are closed at the toe. Do not wear sandals. If you use a stepladder: Make sure that it is fully opened. Do not climb a closed stepladder. Make sure that both sides of the stepladder are locked into place. Ask someone to hold it for you, if possible. Clearly mark and make sure that you can see: Any grab bars or handrails. First and last steps. Where the edge of each step is. Use tools that help you move around (mobility aids) if they are needed. These include: Canes. Walkers. Scooters. Crutches. Turn on the lights when you go into a dark area. Replace any light bulbs as soon as they burn out. Set up your furniture so you have a clear path. Avoid moving your furniture around. If any of your floors are uneven, fix them. If there are any pets around you, be aware of where they are. Review your medicines with your doctor. Some medicines can make you feel dizzy. This can increase your chance of falling. Ask your doctor what other things that you can do to help prevent falls. This information is not intended to replace advice given to you by your health care provider. Make sure you discuss any questions you have with your health care provider. Document Released: 08/03/2009 Document Revised: 03/14/2016 Document Reviewed: 11/11/2014 Elsevier Interactive Patient Education  2017 [employer].

## 2022-08-22 ENCOUNTER — Other Ambulatory Visit: Payer: Self-pay | Admitting: Nurse Practitioner

## 2022-10-27 ENCOUNTER — Other Ambulatory Visit: Payer: Self-pay | Admitting: Nurse Practitioner

## 2022-10-31 NOTE — Telephone Encounter (Signed)
Chart supports Rx Last OV: 07/2022 Next OV: not scheduled

## 2022-12-11 DIAGNOSIS — L570 Actinic keratosis: Secondary | ICD-10-CM | POA: Diagnosis not present

## 2022-12-11 DIAGNOSIS — D485 Neoplasm of uncertain behavior of skin: Secondary | ICD-10-CM | POA: Diagnosis not present

## 2022-12-11 DIAGNOSIS — C44311 Basal cell carcinoma of skin of nose: Secondary | ICD-10-CM | POA: Diagnosis not present

## 2023-01-13 DIAGNOSIS — C441122 Basal cell carcinoma of skin of right lower eyelid, including canthus: Secondary | ICD-10-CM | POA: Diagnosis not present

## 2023-01-13 DIAGNOSIS — D485 Neoplasm of uncertain behavior of skin: Secondary | ICD-10-CM | POA: Diagnosis not present

## 2023-01-13 DIAGNOSIS — C44311 Basal cell carcinoma of skin of nose: Secondary | ICD-10-CM | POA: Diagnosis not present

## 2023-01-16 DIAGNOSIS — C4401 Basal cell carcinoma of skin of lip: Secondary | ICD-10-CM | POA: Diagnosis not present

## 2023-01-16 DIAGNOSIS — C44311 Basal cell carcinoma of skin of nose: Secondary | ICD-10-CM | POA: Diagnosis not present

## 2023-01-20 DIAGNOSIS — L905 Scar conditions and fibrosis of skin: Secondary | ICD-10-CM | POA: Diagnosis not present

## 2023-01-28 NOTE — Progress Notes (Unsigned)
   Established Patient Office Visit  Subjective   Patient ID: [name], female    DOB: [birth year]  Age: 77 y.o. MRN: [id number]  No chief complaint on file.   HPI  [name] is here to follow-up on hyperlipidemia and hypertension.   HYPERTENSION / HYPERLIPIDEMIA  Satisfied with current treatment? yes Duration of hypertension: chronic BP monitoring frequency: {Blank single:19197::"not checking","rarely","daily","weekly","monthly","a few times a day","a few times a week","a few times a month"} BP range:  BP medication side effects: no Past BP meds: lisinopril Duration of hyperlipidemia: chronic Cholesterol medication side effects: no Cholesterol supplements: fish oil Past cholesterol medications: zetia Medication compliance: excellent compliance Aspirin: yes Recent stressors: {Blank single:19197::"yes","no"} Recurrent headaches: {Blank single:19197::"yes","no"} Visual changes: {Blank single:19197::"yes","no"} Palpitations: {Blank single:19197::"yes","no"} Dyspnea: {Blank single:19197::"yes","no"} Chest pain: {Blank single:19197::"yes","no"} Lower extremity edema: {Blank single:19197::"yes","no"} Dizzy/lightheaded: {Blank single:19197::"yes","no"}   {History (Optional):23778}  ROS    Objective:     There were no vitals taken for this visit. {Vitals History (Optional):23777}  Physical Exam   No results found for any visits on 01/29/23.  {Labs (Optional):23779}  The 10-year ASCVD risk score (Arnett DK, et al., 2019) is: 20%    Assessment & Plan:   Problem List Items Addressed This Visit   None   No follow-ups on file.    Gerre Scull, NP

## 2023-01-29 ENCOUNTER — Encounter: Payer: Self-pay | Admitting: Nurse Practitioner

## 2023-01-29 ENCOUNTER — Ambulatory Visit (INDEPENDENT_AMBULATORY_CARE_PROVIDER_SITE_OTHER): Payer: Medicare HMO | Admitting: Nurse Practitioner

## 2023-01-29 VITALS — BP 114/70 | HR 70 | Temp 98.1°F | Ht 65.0 in | Wt 165.2 lb

## 2023-01-29 DIAGNOSIS — R7303 Prediabetes: Secondary | ICD-10-CM

## 2023-01-29 DIAGNOSIS — M25511 Pain in right shoulder: Secondary | ICD-10-CM | POA: Diagnosis not present

## 2023-01-29 DIAGNOSIS — G8929 Other chronic pain: Secondary | ICD-10-CM

## 2023-01-29 DIAGNOSIS — I10 Essential (primary) hypertension: Secondary | ICD-10-CM

## 2023-01-29 DIAGNOSIS — E782 Mixed hyperlipidemia: Secondary | ICD-10-CM | POA: Diagnosis not present

## 2023-01-29 LAB — COMPREHENSIVE METABOLIC PANEL
ALT: 17 U/L (ref 0–35)
AST: 20 U/L (ref 0–37)
Albumin: 4.4 g/dL (ref 3.5–5.2)
Alkaline Phosphatase: 47 U/L (ref 39–117)
BUN: 14 mg/dL (ref 6–23)
CO2: 23 mEq/L (ref 19–32)
Calcium: 10.2 mg/dL (ref 8.4–10.5)
Chloride: 101 mEq/L (ref 96–112)
Creatinine, Ser: 0.92 mg/dL (ref 0.40–1.20)
GFR: 60.36 mL/min (ref >60.00)
Glucose, Bld: 100 mg/dL — ABNORMAL HIGH (ref 70–99)
Potassium: 4 mEq/L (ref 3.5–5.1)
Sodium: 138 mEq/L (ref 135–145)
Total Bilirubin: 0.8 mg/dL (ref 0.2–1.2)
Total Protein: 6.9 g/dL (ref 6.0–8.3)

## 2023-01-29 LAB — CBC
HCT: 38.9 % (ref 36.0–46.0)
Hemoglobin: 13.1 g/dL (ref 12.0–15.0)
MCHC: 33.7 g/dL (ref 30.0–36.0)
MCV: 93.3 fl (ref 78.0–100.0)
Platelets: 222 10*3/uL (ref 150.0–400.0)
RBC: 4.17 Mil/uL (ref 3.87–5.11)
RDW: 12.7 % (ref 11.5–15.5)
WBC: 5.6 10*3/uL (ref 4.0–10.5)

## 2023-01-29 LAB — LIPID PANEL
Cholesterol: 259 mg/dL — ABNORMAL HIGH (ref 0–200)
HDL: 48.1 mg/dL (ref >39.00)
LDL Cholesterol: 183 mg/dL — ABNORMAL HIGH (ref 0–99)
NonHDL: 210.92
Total CHOL/HDL Ratio: 5
Triglycerides: 141 mg/dL (ref 0.0–149.0)
VLDL: 28.2 mg/dL (ref 0.0–40.0)

## 2023-01-29 LAB — HEMOGLOBIN A1C: Hgb A1c MFr Bld: 5.9 % (ref 4.6–6.5)

## 2023-01-29 MED ORDER — REPATHA 140 MG/ML ~~LOC~~ SOSY: 140.0000 mg | PREFILLED_SYRINGE | SUBCUTANEOUS | 2 refills | Status: DC

## 2023-01-29 NOTE — Addendum Note (Signed)
Addended by: Rodman Pickle A on: 01/29/2023 02:38 PM   Modules accepted: Orders

## 2023-01-29 NOTE — Patient Instructions (Addendum)
It was great to see you!  We are checking your labs today and will let you know the results via mychart/phone.   You can look up Repatha which is an injection to help with cholesterol.   Let's follow-up in 3 months, sooner if you have concerns.  If a referral was placed today, you will be contacted for an appointment. Please note that routine referrals can sometimes take up to 3-4 weeks to process. Please call our office if you haven't heard anything after this time frame.  Take care,  Rodman Pickle, NP

## 2023-01-29 NOTE — Assessment & Plan Note (Addendum)
Chronic, ongoing. Her ASCVD score is 18.8%. She has had allergies to atorvastatin, simvastatin, pravastatin and didn't tolerate zetia. Discussed starting repatha. Will check lipid panel today and decide to start after labs results. Follow-up in 3 months.

## 2023-01-29 NOTE — Assessment & Plan Note (Signed)
We will check A1c today. 

## 2023-01-29 NOTE — Assessment & Plan Note (Signed)
Chronic, stable. BP today 114/70. Continue lisinopril 10mg  daily. Check CMP, CBC, lipid panel today. Follow-up in 6 months.

## 2023-01-29 NOTE — Assessment & Plan Note (Signed)
Chronic, ongoing. She has been having right shoulder pain since working out in the garden. Continue meloxicam 7.5-15mg  daily as needed for pain, using ice/heat, and not doing anything strenuous for a few days with her arm/shoulder. Follow-up if symptoms worsen.

## 2023-02-05 DIAGNOSIS — C44111 Basal cell carcinoma of skin of unspecified eyelid, including canthus: Secondary | ICD-10-CM | POA: Diagnosis not present

## 2023-02-05 DIAGNOSIS — Z9889 Other specified postprocedural states: Secondary | ICD-10-CM | POA: Diagnosis not present

## 2023-02-27 ENCOUNTER — Telehealth: Payer: Self-pay

## 2023-02-27 NOTE — Telephone Encounter (Signed)
Patient Advocate Encounter  Prior Authorization for Repatha 140MG /ML syringes has been approved through [employer].  Key: [id number]    Effective: 02-26-2023 to 10-21-2023

## 2023-03-18 DIAGNOSIS — F419 Anxiety disorder, unspecified: Secondary | ICD-10-CM | POA: Diagnosis not present

## 2023-03-18 DIAGNOSIS — M5136 Other intervertebral disc degeneration, lumbar region: Secondary | ICD-10-CM | POA: Diagnosis not present

## 2023-03-18 DIAGNOSIS — I10 Essential (primary) hypertension: Secondary | ICD-10-CM | POA: Diagnosis not present

## 2023-03-18 DIAGNOSIS — M5030 Other cervical disc degeneration, unspecified cervical region: Secondary | ICD-10-CM | POA: Diagnosis not present

## 2023-03-18 DIAGNOSIS — F418 Other specified anxiety disorders: Secondary | ICD-10-CM | POA: Diagnosis not present

## 2023-03-18 DIAGNOSIS — Z79899 Other long term (current) drug therapy: Secondary | ICD-10-CM | POA: Diagnosis not present

## 2023-03-18 DIAGNOSIS — Z853 Personal history of malignant neoplasm of breast: Secondary | ICD-10-CM | POA: Diagnosis not present

## 2023-03-18 DIAGNOSIS — Z01818 Encounter for other preprocedural examination: Secondary | ICD-10-CM | POA: Diagnosis not present

## 2023-03-18 DIAGNOSIS — C44319 Basal cell carcinoma of skin of other parts of face: Secondary | ICD-10-CM | POA: Diagnosis not present

## 2023-03-18 DIAGNOSIS — E785 Hyperlipidemia, unspecified: Secondary | ICD-10-CM | POA: Diagnosis not present

## 2023-03-27 DIAGNOSIS — C44319 Basal cell carcinoma of skin of other parts of face: Secondary | ICD-10-CM | POA: Diagnosis not present

## 2023-03-28 ENCOUNTER — Other Ambulatory Visit: Payer: Self-pay | Admitting: Nurse Practitioner

## 2023-03-28 DIAGNOSIS — C44111 Basal cell carcinoma of skin of unspecified eyelid, including canthus: Secondary | ICD-10-CM | POA: Diagnosis not present

## 2023-03-28 DIAGNOSIS — I10 Essential (primary) hypertension: Secondary | ICD-10-CM | POA: Diagnosis not present

## 2023-03-28 DIAGNOSIS — M8589 Other specified disorders of bone density and structure, multiple sites: Secondary | ICD-10-CM

## 2023-03-28 DIAGNOSIS — M5416 Radiculopathy, lumbar region: Secondary | ICD-10-CM

## 2023-03-28 DIAGNOSIS — E785 Hyperlipidemia, unspecified: Secondary | ICD-10-CM | POA: Diagnosis not present

## 2023-03-28 DIAGNOSIS — C441122 Basal cell carcinoma of skin of right lower eyelid, including canthus: Secondary | ICD-10-CM | POA: Diagnosis not present

## 2023-03-28 DIAGNOSIS — Z853 Personal history of malignant neoplasm of breast: Secondary | ICD-10-CM | POA: Diagnosis not present

## 2023-03-28 DIAGNOSIS — C441121 Basal cell carcinoma of skin of right upper eyelid, including canthus: Secondary | ICD-10-CM | POA: Diagnosis not present

## 2023-03-28 NOTE — Telephone Encounter (Signed)
Requesting: MELOXICAM 15 MG TABLET  Last Visit: 01/29/2023 Next Visit: Visit date not found Last Refill: 07/29/2022  Please Advise

## 2023-04-07 DIAGNOSIS — C44111 Basal cell carcinoma of skin of unspecified eyelid, including canthus: Secondary | ICD-10-CM | POA: Diagnosis not present

## 2023-04-16 DIAGNOSIS — L720 Epidermal cyst: Secondary | ICD-10-CM | POA: Diagnosis not present

## 2023-04-16 DIAGNOSIS — L570 Actinic keratosis: Secondary | ICD-10-CM | POA: Diagnosis not present

## 2023-04-16 DIAGNOSIS — L820 Inflamed seborrheic keratosis: Secondary | ICD-10-CM | POA: Diagnosis not present

## 2023-04-16 DIAGNOSIS — D692 Other nonthrombocytopenic purpura: Secondary | ICD-10-CM | POA: Diagnosis not present

## 2023-04-16 DIAGNOSIS — Z129 Encounter for screening for malignant neoplasm, site unspecified: Secondary | ICD-10-CM | POA: Diagnosis not present

## 2023-04-16 DIAGNOSIS — L821 Other seborrheic keratosis: Secondary | ICD-10-CM | POA: Diagnosis not present

## 2023-04-16 DIAGNOSIS — I781 Nevus, non-neoplastic: Secondary | ICD-10-CM | POA: Diagnosis not present

## 2023-04-16 DIAGNOSIS — D485 Neoplasm of uncertain behavior of skin: Secondary | ICD-10-CM | POA: Diagnosis not present

## 2023-04-16 DIAGNOSIS — D225 Melanocytic nevi of trunk: Secondary | ICD-10-CM | POA: Diagnosis not present

## 2023-05-10 DIAGNOSIS — Z7982 Long term (current) use of aspirin: Secondary | ICD-10-CM | POA: Diagnosis not present

## 2023-05-10 DIAGNOSIS — F419 Anxiety disorder, unspecified: Secondary | ICD-10-CM | POA: Diagnosis not present

## 2023-05-10 DIAGNOSIS — Z8249 Family history of ischemic heart disease and other diseases of the circulatory system: Secondary | ICD-10-CM | POA: Diagnosis not present

## 2023-05-10 DIAGNOSIS — I10 Essential (primary) hypertension: Secondary | ICD-10-CM | POA: Diagnosis not present

## 2023-05-10 DIAGNOSIS — I739 Peripheral vascular disease, unspecified: Secondary | ICD-10-CM | POA: Diagnosis not present

## 2023-05-10 DIAGNOSIS — E785 Hyperlipidemia, unspecified: Secondary | ICD-10-CM | POA: Diagnosis not present

## 2023-05-10 DIAGNOSIS — G40909 Epilepsy, unspecified, not intractable, without status epilepticus: Secondary | ICD-10-CM | POA: Diagnosis not present

## 2023-05-10 DIAGNOSIS — R739 Hyperglycemia, unspecified: Secondary | ICD-10-CM | POA: Diagnosis not present

## 2023-05-10 DIAGNOSIS — M1990 Unspecified osteoarthritis, unspecified site: Secondary | ICD-10-CM | POA: Diagnosis not present

## 2023-05-10 DIAGNOSIS — M791 Myalgia, unspecified site: Secondary | ICD-10-CM | POA: Diagnosis not present

## 2023-05-10 DIAGNOSIS — M8580 Other specified disorders of bone density and structure, unspecified site: Secondary | ICD-10-CM | POA: Diagnosis not present

## 2023-05-10 DIAGNOSIS — G709 Myoneural disorder, unspecified: Secondary | ICD-10-CM | POA: Diagnosis not present

## 2023-05-10 DIAGNOSIS — Z008 Encounter for other general examination: Secondary | ICD-10-CM | POA: Diagnosis not present

## 2023-05-13 ENCOUNTER — Telehealth: Payer: Self-pay | Admitting: Nurse Practitioner

## 2023-05-13 NOTE — Telephone Encounter (Signed)
05/13/23 called pt and LVMTCB to sch AWV for this year. Last AWV was in 08/21/2022.

## 2023-06-25 ENCOUNTER — Other Ambulatory Visit: Payer: Self-pay | Admitting: Nurse Practitioner

## 2023-06-25 DIAGNOSIS — M5416 Radiculopathy, lumbar region: Secondary | ICD-10-CM

## 2023-06-25 DIAGNOSIS — M8589 Other specified disorders of bone density and structure, multiple sites: Secondary | ICD-10-CM

## 2023-06-26 NOTE — Telephone Encounter (Signed)
Requesting: MELOXICAM 15 MG TABLET  Last Visit: 01/29/2023 Next Visit: 08/06/2023 Last Refill: 03/28/2023  Please Advise

## 2023-06-30 ENCOUNTER — Telehealth: Payer: Self-pay | Admitting: Nurse Practitioner

## 2023-06-30 NOTE — Telephone Encounter (Signed)
Triage note sent to Lauren.

## 2023-06-30 NOTE — Telephone Encounter (Signed)
Received [facility] report pt tested positive for COVID 9/7. LVM to offer appt.

## 2023-07-01 NOTE — Telephone Encounter (Signed)
Noted  

## 2023-07-01 NOTE — Telephone Encounter (Signed)
I called and spoke with patient and told her I was following up on triage message on 06/28/23 and the message left yesterday for a return call. Patient said that she is feeling better and sees no need for an office visit. She will call back if need to for an appointment.

## 2023-07-09 DIAGNOSIS — C44111 Basal cell carcinoma of skin of unspecified eyelid, including canthus: Secondary | ICD-10-CM | POA: Diagnosis not present

## 2023-08-06 ENCOUNTER — Encounter: Payer: Self-pay | Admitting: Nurse Practitioner

## 2023-08-06 ENCOUNTER — Ambulatory Visit: Payer: Medicare HMO | Admitting: Nurse Practitioner

## 2023-08-06 VITALS — BP 118/78 | HR 67 | Temp 97.2°F | Ht 65.0 in | Wt 161.2 lb

## 2023-08-06 DIAGNOSIS — E782 Mixed hyperlipidemia: Secondary | ICD-10-CM

## 2023-08-06 DIAGNOSIS — R7303 Prediabetes: Secondary | ICD-10-CM | POA: Diagnosis not present

## 2023-08-06 DIAGNOSIS — M8589 Other specified disorders of bone density and structure, multiple sites: Secondary | ICD-10-CM

## 2023-08-06 DIAGNOSIS — Z0000 Encounter for general adult medical examination without abnormal findings: Secondary | ICD-10-CM

## 2023-08-06 DIAGNOSIS — I10 Essential (primary) hypertension: Secondary | ICD-10-CM

## 2023-08-06 DIAGNOSIS — M5416 Radiculopathy, lumbar region: Secondary | ICD-10-CM | POA: Diagnosis not present

## 2023-08-06 DIAGNOSIS — Z23 Encounter for immunization: Secondary | ICD-10-CM

## 2023-08-06 LAB — CBC WITH DIFFERENTIAL/PLATELET
Basophils Absolute: 0.1 10*3/uL (ref 0.0–0.1)
Basophils Relative: 1 % (ref 0.0–3.0)
Eosinophils Absolute: 0.1 10*3/uL (ref 0.0–0.7)
Eosinophils Relative: 1.5 % (ref 0.0–5.0)
HCT: 40.4 % (ref 36.0–46.0)
Hemoglobin: 12.9 g/dL (ref 12.0–15.0)
Lymphocytes Relative: 38.4 % (ref 12.0–46.0)
Lymphs Abs: 2.2 10*3/uL (ref 0.7–4.0)
MCHC: 32 g/dL (ref 30.0–36.0)
MCV: 93.8 fL (ref 78.0–100.0)
Monocytes Absolute: 0.5 10*3/uL (ref 0.1–1.0)
Monocytes Relative: 8.3 % (ref 3.0–12.0)
Neutro Abs: 2.9 10*3/uL (ref 1.4–7.7)
Neutrophils Relative %: 50.8 % (ref 43.0–77.0)
Platelets: 228 10*3/uL (ref 150.0–400.0)
RBC: 4.31 Mil/uL (ref 3.87–5.11)
RDW: 13 % (ref 11.5–15.5)
WBC: 5.7 10*3/uL (ref 4.0–10.5)

## 2023-08-06 LAB — LIPID PANEL
Cholesterol: 239 mg/dL — ABNORMAL HIGH (ref 0–200)
HDL: 47.7 mg/dL (ref >39.00)
LDL Cholesterol: 165 mg/dL — ABNORMAL HIGH (ref 0–99)
NonHDL: 190.85
Total CHOL/HDL Ratio: 5
Triglycerides: 129 mg/dL (ref 0.0–149.0)
VLDL: 25.8 mg/dL (ref 0.0–40.0)

## 2023-08-06 LAB — COMPREHENSIVE METABOLIC PANEL
ALT: 15 U/L (ref 0–35)
AST: 17 U/L (ref 0–37)
Albumin: 4.3 g/dL (ref 3.5–5.2)
Alkaline Phosphatase: 45 U/L (ref 39–117)
BUN: 15 mg/dL (ref 6–23)
CO2: 28 meq/L (ref 19–32)
Calcium: 9.8 mg/dL (ref 8.4–10.5)
Chloride: 106 meq/L (ref 96–112)
Creatinine, Ser: 0.95 mg/dL (ref 0.40–1.20)
GFR: 57.87 mL/min — ABNORMAL LOW (ref >60.00)
Glucose, Bld: 107 mg/dL — ABNORMAL HIGH (ref 70–99)
Potassium: 4.7 meq/L (ref 3.5–5.1)
Sodium: 139 meq/L (ref 135–145)
Total Bilirubin: 0.8 mg/dL (ref 0.2–1.2)
Total Protein: 6.8 g/dL (ref 6.0–8.3)

## 2023-08-06 LAB — HEMOGLOBIN A1C: Hgb A1c MFr Bld: 5.9 % (ref 4.6–6.5)

## 2023-08-06 MED ORDER — LISINOPRIL 10 MG PO TABS: 10.0000 mg | ORAL_TABLET | Freq: Every day | ORAL | 3 refills | Status: DC

## 2023-08-06 MED ORDER — BACLOFEN 10 MG PO TABS: 10.0000 mg | ORAL_TABLET | Freq: Three times a day (TID) | ORAL | 1 refills | Status: DC | PRN

## 2023-08-06 MED ORDER — CYCLOBENZAPRINE HCL 5 MG PO TABS: 5.0000 mg | ORAL_TABLET | Freq: Three times a day (TID) | ORAL | 1 refills | Status: DC | PRN

## 2023-08-06 MED ORDER — MELOXICAM 15 MG PO TABS
7.5000 mg | ORAL_TABLET | Freq: Every day | ORAL | 3 refills | Status: AC | PRN
Start: 2023-08-06 — End: ?

## 2023-08-06 NOTE — Progress Notes (Signed)
BP 118/78 (BP Location: Left Arm)   Pulse 67   Temp (!) 97.2 F (36.2 C)   Ht 5\' 5"  (1.651 m)   Wt 161 lb 3.2 oz (73.1 kg)   SpO2 98%   BMI 26.83 kg/m    Subjective:    Patient ID: Ardyth Man, female    DOB: [birth year], 77 y.o.   MRN: [id number]  CC: Chief Complaint  Patient presents with   Annual Exam    With fasting labs, Flu vaccine    HPI: KHAI TORBERT is a 77 y.o. female presenting on 08/06/2023 for comprehensive medical examination. Current medical complaints include:none  She currently lives with: husband Menopausal Symptoms: no  Depression and Anxiety Screen done today and results listed below:     08/06/2023    9:23 AM 01/29/2023    9:41 AM 08/21/2022    2:45 PM 07/29/2022    8:33 AM 05/10/2022    9:29 AM  Depression screen PHQ 2/9  Decreased Interest 0 0 0 0 0  Down, Depressed, Hopeless 0 0 0 0 0  PHQ - 2 Score 0 0 0 0 0  Altered sleeping 1 1 0 2   Tired, decreased energy 0 0 0 1   Change in appetite 0 0 0 0   Feeling bad or failure about yourself  0 0 0 0   Trouble concentrating 0 0 0 0   Moving slowly or fidgety/restless 0 0 0 0   Suicidal thoughts 0 0 0 0   PHQ-9 Score 1 1 0 3   Difficult doing work/chores Somewhat difficult Not difficult at all Not difficult at all Not difficult at all       08/06/2023    9:23 AM 01/29/2023    9:42 AM 07/29/2022    8:34 AM 07/12/2021   11:03 AM  GAD 7 : Generalized Anxiety Score  Nervous, Anxious, on Edge 0 0 0 0  Control/stop worrying 0 0 1 0  Worry too much - different things 0 1 1 1   Trouble relaxing 0 0 1 0  Restless 0 1 0 0  Easily annoyed or irritable 0 0 0 0  Afraid - awful might happen 0 0 1 0  Total GAD 7 Score 0 2 4 1   Anxiety Difficulty Not difficult at all Not difficult at all Not difficult at all Not difficult at all    The patient does not have a history of falls. I did not complete a risk assessment for falls. A plan of care for falls was not documented.   Past Medical History:  Past  Medical History:  Diagnosis Date   Arthritis    Cancer (HCC) 01/07/2006   Breast cancer- L Breast   Cataract    Complication of anesthesia    Hypertension    Injury of ligament of hand    right thumb ulnar collateral ligament injury.  recommend MR arthrogram of the thumb.   Neuromuscular disorder (HCC)    scoliosis s/p spine surgery, has arthritis and pinched nerves   PONV (postoperative nausea and vomiting)    Scoliosis     Surgical History:  Past Surgical History:  Procedure Laterality Date   ABDOMINAL HYSTERECTOMY  2002   have ovaries   BREAST SURGERY  2006   left, cancer   EYE SURGERY  2012   KNEE SURGERY Left    SPINE SURGERY  1981 and 2007   TOTAL KNEE ARTHROPLASTY Right 03/07/2020   Procedure: TOTAL KNEE  ARTHROPLASTY;  Surgeon: Durene Romans, MD;  Location: [facility];  Service: Orthopedics;  Laterality: Right;  70 mins   TUBAL LIGATION      Medications:  Current Outpatient Medications on File Prior to Visit  Medication Sig   acetaminophen (TYLENOL) 650 MG CR tablet Tylenol Arthritis Pain 650 mg tablet,extended release   1 tablet as needed by oral route.   aspirin 81 MG EC tablet Adult Low Dose Aspirin 81 mg tablet,delayed release   1 tablet every day by oral route.   Black Pepper-Turmeric 3-500 MG CAPS    Camphor-Menthol-Methyl Sal (SALONPAS) 3.10-26-08 % PTCH 1 patch as needed by topical route.   Cholecalciferol (VITAMIN D) 125 MCG (5000 UT) CAPS Take 5,000 Units by mouth daily.    Multiple Vitamin (MULTIVITAMIN) tablet Take 1 tablet by mouth daily.   Omega-3 Fatty Acids (FISH OIL) 1000 MG CAPS Take 2,000 mg by mouth daily.   Loratadine-Pseudoephedrine (CLARITIN-D 12 HOUR PO) Take by mouth.   No current facility-administered medications on file prior to visit.    Allergies:  Allergies  Allergen Reactions   Hydrocodone-Acetaminophen Itching and Hives    Hives, itching Hives, itching    Albuterol Other (See Comments) and Palpitations    Heart race, tremble and  studdering Heart race, tremble and studdering Heart race, tremble and studdering   Codeine Other (See Comments)    DIZZINESS Confusion and dizziness DIZZINESS Confusion/hallucinations/dizziness Confusion/hallucinations/dizziness    Pravastatin Sodium Other (See Comments) and Nausea And Vomiting    Per patient caused headaches and GI upset Per patient caused headaches and GI upset Per patient caused headaches and GI upset     Social History:  Social History   Socioeconomic History   Marital status: Married    Spouse name: Not on file   Number of children: Not on file   Years of education: Not on file   Highest education level: GED or equivalent  Occupational History   Occupation: Retired  Tobacco Use   Smoking status: Never   Smokeless tobacco: Never  Vaping Use   Vaping status: Never Used  Substance and Sexual Activity   Alcohol use: No   Drug use: No   Sexual activity: Yes  Other Topics Concern   Not on file  Social History Narrative   Married   Education: High School   Exercise: two times a week   Social Determinants of Health   Financial Resource Strain: Low Risk  (01/27/2023)   Overall Financial Resource Strain (CARDIA)    Difficulty of Paying Living Expenses: Not hard at all  Food Insecurity: No Food Insecurity (01/27/2023)   Hunger Vital Sign    Worried About Running Out of Food in the Last Year: Never true    Ran Out of Food in the Last Year: Never true  Transportation Needs: No Transportation Needs (01/27/2023)   PRAPARE - Administrator, Civil Service (Medical): No    Lack of Transportation (Non-Medical): No  Physical Activity: Sufficiently Active (01/27/2023)   Exercise Vital Sign    Days of Exercise per Week: 5 days    Minutes of Exercise per Session: 40 min  Stress: No Stress Concern Present (01/27/2023)   [employer] of Occupational Health - Occupational Stress Questionnaire    Feeling of Stress : Only a little  Social Connections:  Socially Integrated (01/27/2023)   Social Connection and Isolation Panel [NHANES]    Frequency of Communication with Friends and Family: Three times a week  Frequency of Social Gatherings with Friends and Family: Three times a week    Attends Religious Services: More than 4 times per year    Active Member of Clubs or Organizations: Yes    Attends Banker Meetings: More than 4 times per year    Marital Status: Living with partner  Intimate Partner Violence: Not At Risk (08/21/2022)   Humiliation, Afraid, Rape, and Kick questionnaire    Fear of Current or Ex-Partner: No    Emotionally Abused: No    Physically Abused: No    Sexually Abused: No   Social History   Tobacco Use  Smoking Status Never  Smokeless Tobacco Never   Social History   Substance and Sexual Activity  Alcohol Use No    Family History:  Family History  Problem Relation Age of Onset   Diabetes Mother        age early 47's   Heart disease Mother        age early 31's or 25's   Hypertension Mother    Hyperlipidemia Mother    Stroke Father 109       per pt mini   Diabetes Sister 58       type II   Hyperlipidemia Sister    Hypertension Sister    Kidney disease Brother        polycystic kidney   Diabetes Brother    Heart disease Brother    Alcohol abuse Maternal Grandfather    Cancer Maternal Grandmother        Leukemia   Cancer Paternal Aunt 72       Breast    Past medical history, surgical history, medications, allergies, family history and social history reviewed with patient today and changes made to appropriate areas of the chart.   Review of Systems  Constitutional: Negative.   HENT: Negative.    Eyes: Negative.   Respiratory: Negative.    Cardiovascular: Negative.   Gastrointestinal: Negative.   Genitourinary: Negative.   Musculoskeletal:  Positive for joint pain.  Skin: Negative.   Neurological: Negative.   Psychiatric/Behavioral:  The patient has insomnia.    All other  ROS negative except what is listed above and in the HPI.      Objective:    BP 118/78 (BP Location: Left Arm)   Pulse 67   Temp (!) 97.2 F (36.2 C)   Ht 5\' 5"  (1.651 m)   Wt 161 lb 3.2 oz (73.1 kg)   SpO2 98%   BMI 26.83 kg/m   Wt Readings from Last 3 Encounters:  08/06/23 161 lb 3.2 oz (73.1 kg)  01/29/23 165 lb 3.2 oz (74.9 kg)  08/21/22 167 lb (75.8 kg)    Physical Exam Vitals and nursing note reviewed.  Constitutional:      General: She is not in acute distress.    Appearance: Normal appearance.  HENT:     Head: Normocephalic and atraumatic.     Right Ear: Tympanic membrane, ear canal and external ear normal.     Left Ear: Tympanic membrane, ear canal and external ear normal.  Eyes:     Conjunctiva/sclera: Conjunctivae normal.  Cardiovascular:     Rate and Rhythm: Normal rate and regular rhythm.     Pulses: Normal pulses.     Heart sounds: Normal heart sounds.  Pulmonary:     Effort: Pulmonary effort is normal.     Breath sounds: Normal breath sounds.  Abdominal:     Palpations: Abdomen is soft.  Tenderness: There is no abdominal tenderness.  Musculoskeletal:        General: Normal range of motion.     Cervical back: Normal range of motion and neck supple.     Right lower leg: No edema.     Left lower leg: No edema.  Lymphadenopathy:     Cervical: No cervical adenopathy.  Skin:    General: Skin is warm and dry.  Neurological:     General: No focal deficit present.     Mental Status: She is alert and oriented to person, place, and time.     Cranial Nerves: No cranial nerve deficit.     Coordination: Coordination normal.     Gait: Gait normal.  Psychiatric:        Mood and Affect: Mood normal.        Behavior: Behavior normal.        Thought Content: Thought content normal.        Judgment: Judgment normal.     Results for orders placed or performed in visit on 01/29/23  CBC  Result Value Ref Range   WBC 5.6 4.0 - 10.5 K/uL   RBC 4.17 3.87 -  5.11 Mil/uL   Platelets 222.0 150.0 - 400.0 K/uL   Hemoglobin 13.1 12.0 - 15.0 g/dL   HCT 16.1 09.6 - 04.5 %   MCV 93.3 78.0 - 100.0 fl   MCHC 33.7 30.0 - 36.0 g/dL   RDW 40.9 81.1 - 91.4 %  Comprehensive metabolic panel  Result Value Ref Range   Sodium 138 135 - 145 mEq/L   Potassium 4.0 3.5 - 5.1 mEq/L   Chloride 101 96 - 112 mEq/L   CO2 23 19 - 32 mEq/L   Glucose, Bld 100 (H) 70 - 99 mg/dL   BUN 14 6 - 23 mg/dL   Creatinine, Ser 7.82 0.40 - 1.20 mg/dL   Total Bilirubin 0.8 0.2 - 1.2 mg/dL   Alkaline Phosphatase 47 39 - 117 U/L   AST 20 0 - 37 U/L   ALT 17 0 - 35 U/L   Total Protein 6.9 6.0 - 8.3 g/dL   Albumin 4.4 3.5 - 5.2 g/dL   GFR 95.62 >13.08 mL/min   Calcium 10.2 8.4 - 10.5 mg/dL  Lipid panel  Result Value Ref Range   Cholesterol 259 (H) 0 - 200 mg/dL   Triglycerides 657.8 0.0 - 149.0 mg/dL   HDL 46.96 >29.52 mg/dL   VLDL 84.1 0.0 - 32.4 mg/dL   LDL Cholesterol 401 (H) 0 - 99 mg/dL   Total CHOL/HDL Ratio 5    NonHDL 210.92   Hemoglobin A1c  Result Value Ref Range   Hgb A1c MFr Bld 5.9 4.6 - 6.5 %      Assessment & Plan:   Problem List Items Addressed This Visit       Cardiovascular and Mediastinum   Hypertensive disorder    Chronic, stable. Continue lisinopril 10mg  daily. Check CMP, CBC, lipid panel today. Follow-up in 1 year      Relevant Medications   lisinopril (ZESTRIL) 10 MG tablet   Other Relevant Orders   CBC with Differential/Platelet   Comprehensive metabolic panel     Nervous and Auditory   Lumbar radiculopathy, chronic    Chronic, ongoing.  She states that she will sometimes wake up at night with back pain and spasms.  She has been taking the baclofen, however she is wondering if there is anything else.  She had tried tizanidine and  methocarbamol in the past.  Will have her try low-dose of Flexeril 5 mg at bedtime as needed.  If this does not help as much is the baclofen she can continue taking that instead.      Relevant Medications    baclofen (LIORESAL) 10 MG tablet   meloxicam (MOBIC) 15 MG tablet   cyclobenzaprine (FLEXERIL) 5 MG tablet     Musculoskeletal and Integument   Osteopenia    Last DEXA scan showed a T score of -1.2 on 07/19/2020.  We will repeat DEXA in 2026.      Relevant Medications   meloxicam (MOBIC) 15 MG tablet     Other   Hyperlipidemia    Chronic, ongoing. Her ASCVD score is 18.8%. She has had allergies to atorvastatin, simvastatin, pravastatin and didn't tolerate zetia.  She declined starting Repatha due to potential side effects.  Check CMP, CBC, lipid panel today.  She has been adjusting her diet and trying to eat healthier.      Relevant Medications   lisinopril (ZESTRIL) 10 MG tablet   Other Relevant Orders   CBC with Differential/Platelet   Comprehensive metabolic panel   Lipid panel   Prediabetes    We will check A1c today.      Relevant Orders   Hemoglobin A1c   Routine general medical examination at a health care facility - Primary    Health maintenance reviewed and updated. Discussed nutrition, exercise. Check CMP, CBC today. Follow-up 1 year.        Other Visit Diagnoses     Immunization due       Flu vaccine given today   Relevant Orders   Flu Vaccine Trivalent High Dose (Fluad) (Completed)        Follow up plan: Return in about 1 year (around 08/05/2024) for CPE.   LABORATORY TESTING:  - Pap smear: not applicable  IMMUNIZATIONS:   - Tdap: Tetanus vaccination status reviewed: last tetanus booster within 10 years. - Influenza: Administered today - Pneumovax: Up to date - Prevnar: Up to date - HPV: Not applicable - Shingrix vaccine:  declined today  SCREENING: -Mammogram:  scheduled    - Colonoscopy: Not applicable  - Bone Density: Up to date   PATIENT COUNSELING:   Advised to take 1 mg of folate supplement per day if capable of pregnancy.   Sexuality: Discussed sexually transmitted diseases, partner selection, use of condoms, avoidance of  unintended pregnancy  and contraceptive alternatives.   Advised to avoid cigarette smoking.  I discussed with the patient that most people either abstain from alcohol or drink within safe limits (<=14/week and <=4 drinks/occasion for males, <=7/weeks and <= 3 drinks/occasion for females) and that the risk for alcohol disorders and other health effects rises proportionally with the number of drinks per week and how often a drinker exceeds daily limits.  Discussed cessation/primary prevention of drug use and availability of treatment for abuse.   Diet: Encouraged to adjust caloric intake to maintain  or achieve ideal body weight, to reduce intake of dietary saturated fat and total fat, to limit sodium intake by avoiding high sodium foods and not adding table salt, and to maintain adequate dietary potassium and calcium preferably from fresh fruits, vegetables, and low-fat dairy products.    stressed the importance of regular exercise  Injury prevention: Discussed safety belts, safety helmets, smoke detector, smoking near bedding or upholstery.   Dental health: Discussed importance of regular tooth brushing, flossing, and dental visits.    NEXT  PREVENTATIVE PHYSICAL DUE IN 1 YEAR. Return in about 1 year (around 08/05/2024) for CPE.  Dannel Rafter A Kelli Robeck

## 2023-08-06 NOTE — Assessment & Plan Note (Signed)
Chronic, ongoing.  She states that she will sometimes wake up at night with back pain and spasms.  She has been taking the baclofen, however she is wondering if there is anything else.  She had tried tizanidine and methocarbamol in the past.  Will have her try low-dose of Flexeril 5 mg at bedtime as needed.  If this does not help as much is the baclofen she can continue taking that instead.

## 2023-08-06 NOTE — Patient Instructions (Signed)
It was great to see you!  We are checking your labs today and will let you know the results via mychart/phone.   Start flexeril at bedtime as needed instead of the baclofen and see which works better.   Let's follow-up in 1 year, sooner if you have concerns.  If a referral was placed today, you will be contacted for an appointment. Please note that routine referrals can sometimes take up to 3-4 weeks to process. Please call our office if you haven't heard anything after this time frame.  Take care,  Rodman Pickle, NP

## 2023-08-06 NOTE — Assessment & Plan Note (Addendum)
Chronic, stable. Continue lisinopril 10mg  daily. Check CMP, CBC, lipid panel today. Follow-up in 1 year

## 2023-08-06 NOTE — Assessment & Plan Note (Signed)
We will check A1c today.

## 2023-08-06 NOTE — Assessment & Plan Note (Signed)
Health maintenance reviewed and updated. Discussed nutrition, exercise. Check CMP, CBC today. Follow-up 1 year.   

## 2023-08-06 NOTE — Assessment & Plan Note (Signed)
Last DEXA scan showed a T score of -1.2 on 07/19/2020.  We will repeat DEXA in 2026.

## 2023-08-06 NOTE — Assessment & Plan Note (Addendum)
Chronic, ongoing. Her ASCVD score is 18.8%. She has had allergies to atorvastatin, simvastatin, pravastatin and didn't tolerate zetia.  She declined starting Repatha due to potential side effects.  Check CMP, CBC, lipid panel today.  She has been adjusting her diet and trying to eat healthier.

## 2023-08-20 DIAGNOSIS — Z1231 Encounter for screening mammogram for malignant neoplasm of breast: Secondary | ICD-10-CM | POA: Diagnosis not present

## 2023-08-20 LAB — HM MAMMOGRAPHY

## 2023-10-01 DIAGNOSIS — Z85828 Personal history of other malignant neoplasm of skin: Secondary | ICD-10-CM | POA: Diagnosis not present

## 2023-10-01 DIAGNOSIS — D485 Neoplasm of uncertain behavior of skin: Secondary | ICD-10-CM | POA: Diagnosis not present

## 2023-10-01 DIAGNOSIS — L720 Epidermal cyst: Secondary | ICD-10-CM | POA: Diagnosis not present

## 2023-10-01 DIAGNOSIS — L570 Actinic keratosis: Secondary | ICD-10-CM | POA: Diagnosis not present

## 2023-10-01 DIAGNOSIS — Z129 Encounter for screening for malignant neoplasm, site unspecified: Secondary | ICD-10-CM | POA: Diagnosis not present

## 2023-10-01 DIAGNOSIS — L821 Other seborrheic keratosis: Secondary | ICD-10-CM | POA: Diagnosis not present

## 2023-10-01 DIAGNOSIS — D225 Melanocytic nevi of trunk: Secondary | ICD-10-CM | POA: Diagnosis not present

## 2023-10-21 ENCOUNTER — Encounter: Payer: Self-pay | Admitting: Family Medicine

## 2023-10-21 ENCOUNTER — Ambulatory Visit (INDEPENDENT_AMBULATORY_CARE_PROVIDER_SITE_OTHER): Payer: Medicare HMO | Admitting: Family Medicine

## 2023-10-21 VITALS — BP 122/70 | HR 83 | Temp 98.2°F | Ht 65.0 in | Wt 160.0 lb

## 2023-10-21 DIAGNOSIS — J0100 Acute maxillary sinusitis, unspecified: Secondary | ICD-10-CM

## 2023-10-21 MED ORDER — AMOXICILLIN-POT CLAVULANATE 875-125 MG PO TABS
1.0000 | ORAL_TABLET | Freq: Two times a day (BID) | ORAL | 0 refills | Status: DC
Start: 2023-10-21 — End: 2024-03-20

## 2023-10-21 NOTE — Progress Notes (Signed)
 [facility] [address] [place] [address] Dept: [phone] Dept Fax: [phone]  Office Visit  Subjective:    Patient ID: [name], female    DOB: 10-23-1945, 77 y.o..   MRN: [id number]  Chief Complaint  Patient presents with   Sinus Problem    C/o having ST, sinus congestion, HA, ear pain x 2 1/2 weeks.  Has taken Claritin-D and Tylenol .    History of Present Illness:  Patient is in today complaining of sinus symptoms. She notes she developed typical cold symptoms 2 1/2 weeks ago. This involved nasal congestion and sore throat. her symptoms had been improving, but then worsened over the past several days, with increased dark mucous draining and sinus pressure. She does have some maxillary sensitivity and increased pain with bending forward.  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   Hypertensive disorder 09/25/2012   Lumbar radiculopathy, chronic 09/25/2012   Cervical radiculopathy 09/25/2012   Past Surgical History:  Procedure Laterality Date   ABDOMINAL HYSTERECTOMY  2002   have ovaries   BREAST SURGERY  2006   left, cancer   EYE SURGERY  2012   KNEE SURGERY Left    SPINE SURGERY  1981 and 2007   TOTAL KNEE ARTHROPLASTY Right 03/07/2020   Procedure: TOTAL KNEE ARTHROPLASTY;  Surgeon: Ernie Cough, MD;  Location: [facility];  Service: Orthopedics;  Laterality: Right;  70 mins   TUBAL LIGATION     Family History  Problem Relation Age of Onset    Diabetes Mother        age early 72's   Heart disease Mother        age early 78's or 66's   Hypertension Mother    Hyperlipidemia Mother    Stroke Father 19       per pt mini   Diabetes Sister 26       type II   Hyperlipidemia Sister    Hypertension Sister    Kidney disease Brother        polycystic kidney   Diabetes Brother    Heart disease Brother    Alcohol abuse Maternal Grandfather    Cancer Maternal Grandmother        Leukemia   Cancer Paternal Aunt 90       Breast   Outpatient Medications Prior to Visit  Medication Sig Dispense Refill   acetaminophen  (TYLENOL ) 650 MG CR tablet Tylenol  Arthritis Pain 650 mg tablet,extended release   1 tablet as needed by oral route.     aspirin  81 MG EC tablet Adult Low Dose Aspirin  81 mg tablet,delayed release   1 tablet every day by oral route.     baclofen  (LIORESAL ) 10 MG tablet Take 1 tablet (10 mg total) by mouth 3 (three) times daily as needed for muscle spasms. 270 tablet 1   Black Pepper-Turmeric 3-500 MG CAPS      Camphor-Menthol -Methyl Sal (SALONPAS) 3.10-26-08 % PTCH 1 patch as needed by topical route.     Cholecalciferol (VITAMIN D ) 125 MCG (  5000 UT) CAPS Take 5,000 Units by mouth daily.      lisinopril  (ZESTRIL ) 10 MG tablet Take 1 tablet (10 mg total) by mouth daily. 90 tablet 3   Loratadine-Pseudoephedrine (CLARITIN-D 12 HOUR PO) Take by mouth.     meloxicam  (MOBIC ) 15 MG tablet Take 0.5-1 tablets (7.5-15 mg total) by mouth daily as needed for pain. With food 90 tablet 3   Multiple Vitamin (MULTIVITAMIN) tablet Take 1 tablet by mouth daily.     Omega-3 Fatty Acids (FISH OIL) 1000 MG CAPS Take 2,000 mg by mouth daily.     cyclobenzaprine  (FLEXERIL ) 5 MG tablet Take 1 tablet (5 mg total) by mouth 3 (three) times daily as needed for muscle spasms. Do not take with baclofen . 30 tablet 1   No facility-administered medications prior to visit.   Allergies  Allergen Reactions   Hydrocodone -Acetaminophen  Itching and Hives     Hives, itching Hives, itching    Albuterol  Other (See Comments) and Palpitations    Heart race, tremble and studdering Heart race, tremble and studdering Heart race, tremble and studdering   Codeine Other (See Comments)    DIZZINESS Confusion and dizziness DIZZINESS Confusion/hallucinations/dizziness Confusion/hallucinations/dizziness    Pravastatin  Sodium Other (See Comments) and Nausea And Vomiting    Per patient caused headaches and GI upset Per patient caused headaches and GI upset Per patient caused headaches and GI upset      Objective:   Today's Vitals   10/21/23 1005  BP: 122/70  Pulse: 83  Temp: 98.2 F (36.8 C)  TempSrc: Temporal  SpO2: 98%  Weight: 160 lb (72.6 kg)  Height: 5' 5 (1.651 m)   Body mass index is 26.63 kg/m.   General: Well developed, well nourished. No acute distress. HEENT: Normocephalic, non-traumatic. PERRL, EOMI. Conjunctiva clear. External ears normal.   EAC and TMs normal bilaterally. Nose clear without congestion or rhinorrhea. Mild to moderate   pain on percussion over the sinuses. Mucous membranes moist. Oropharynx clear. Good dentition. Neck: Supple. No lymphadenopathy. No thyromegaly. Lungs: Clear to auscultation bilaterally. No wheezing, rales or rhonchi. Psych: Alert and oriented. Normal mood and affect.  Health Maintenance Due  Topic Date Due   Medicare Annual Wellness (AWV)  08/22/2023     Assessment & Plan:   Problem List Items Addressed This Visit   None Visit Diagnoses       Acute non-recurrent maxillary sinusitis    -  Primary   I will treat her with a course of Augmentin . I recommend she use nasal saline washes and continue OTC meds for symptom management.   Relevant Medications   amoxicillin -clavulanate (AUGMENTIN ) 875-125 MG tablet       Return if symptoms worsen or fail to improve.   Garnette CHRISTELLA Simpler, MD

## 2023-10-23 ENCOUNTER — Ambulatory Visit: Payer: Medicare HMO | Admitting: Nurse Practitioner

## 2023-10-29 ENCOUNTER — Ambulatory Visit: Payer: Self-pay | Admitting: Nurse Practitioner

## 2023-10-29 NOTE — Telephone Encounter (Signed)
    Chief Complaint: cough Symptoms: cough, green mucus, headache, runny nose, sore throat Frequency: since last week Pertinent Negatives: Patient denies difficulty breathing Disposition: [] ED /[] Urgent Care (no appt availability in office) / [x] Appointment(In office/virtual)/ []  [facility]/ [] Home Care/ [] Refused Recommended Disposition /[] [facility]/ []  Follow-up with PCP Additional Notes: Patient reports she was seen in office last week and diagnosed with a sinus infection. At that time she was prescribed antibiotics which she will have completed today.  Patient reports she is feeling worse. Patient reports cough, green mucus, congestion, headache, runny nose, and sore throat. Per protocol, patient schedule for in office appt 1/9. Patient advised to call back with worsening symptoms. Patient verbalized understanding.   Copied from CRM [id number]. Topic: Clinical - Red Word Triage >> Oct 29, 2023  4:19 PM Chantha C wrote: [facility] that prompted transfer to Nurse Triage: Patient c/o sinus infection, headaches, head congestion, chest congestion, coughing phelgm yellowish green, head, throat and teeth pain, tingles, like burning. Patient denies a fever. Patient has one more day of antibiotics left and feels like she's not any better. Reason for Disposition  [1] Using nasal washes and pain medicine > 24 hours AND [2] sinus pain (around cheekbone or eye) persists  Answer Assessment - Initial Assessment Questions 1. ONSET: When did the cough begin?      Last week 2. SEVERITY: How bad is the cough today?      moderate 3. SPUTUM: Describe the color of your sputum (none, dry cough; clear, white, yellow, green)     Green and yellow 4. HEMOPTYSIS: Are you coughing up any blood? If so ask: How much? (flecks, streaks, tablespoons, etc.)     none 5. DIFFICULTY BREATHING: Are you having difficulty breathing? If Yes, ask: How bad is it? (e.g., mild, moderate, severe)     - MILD: No SOB at rest, mild SOB with walking, speaks normally in sentences, can lie down, no retractions, pulse < 100.    - MODERATE: SOB at rest, SOB with minimal exertion and prefers to sit, cannot lie down flat, speaks in phrases, mild retractions, audible wheezing, pulse 100-120.    - SEVERE: Very SOB at rest, speaks in single words, struggling to breathe, sitting hunched forward, retractions, pulse > 120      none 6. FEVER: Do you have a fever? If Yes, ask: What is your temperature, how was it measured, and when did it start?     none 7. CARDIAC HISTORY: Do you have any history of heart disease? (e.g., heart attack, congestive heart failure)      none 8. LUNG HISTORY: Do you have any history of lung disease?  (e.g., pulmonary embolus, asthma, emphysema)     none 9. PE RISK FACTORS: Do you have a history of blood clots? (or: recent major surgery, recent prolonged travel, bedridden)     none 10. OTHER SYMPTOMS: Do you have any other symptoms? (e.g., runny nose, wheezing, chest pain)       Runny nose, congestion, headache, sore throat  Protocols used: Cough - Acute Productive-A-AH

## 2023-10-29 NOTE — Telephone Encounter (Signed)
 Noted.

## 2023-10-30 ENCOUNTER — Ambulatory Visit (INDEPENDENT_AMBULATORY_CARE_PROVIDER_SITE_OTHER): Payer: Medicare HMO | Admitting: Family Medicine

## 2023-10-30 ENCOUNTER — Encounter: Payer: Self-pay | Admitting: Family Medicine

## 2023-10-30 VITALS — BP 122/68 | HR 101 | Temp 97.4°F | Ht 65.0 in | Wt 161.4 lb

## 2023-10-30 DIAGNOSIS — J40 Bronchitis, not specified as acute or chronic: Secondary | ICD-10-CM

## 2023-10-30 DIAGNOSIS — Z20828 Contact with and (suspected) exposure to other viral communicable diseases: Secondary | ICD-10-CM

## 2023-10-30 MED ORDER — PROMETHAZINE-DM 6.25-15 MG/5ML PO SYRP
5.0000 mL | ORAL_SOLUTION | Freq: Four times a day (QID) | ORAL | 1 refills | Status: AC | PRN
Start: 2023-10-30 — End: ?

## 2023-10-30 NOTE — Progress Notes (Signed)
 [facility] [address] [place] [address] Dept: [phone] Dept Fax: [phone]  Office Visit  Subjective:    Patient ID: [name], female    DOB: 11-11-1945, 78 y.o..   MRN: [id number]  Chief Complaint  Patient presents with   Cough    C/o having persistent cough since last office visit 10/21/23.  Has been taking Amoxicillin  and [facility] cough medication   History of Present Illness:  Patient is in today noting a cough having developed over the past week. I had seen her on 10/21/2023 with typical cold symptoms for 2 1/2 weeks ago. At that time, it appears she had developed  a sinus infection. I had treated her with a course of Augmentin , of which she has 1 dose left. She notes the sinus symptoms have improved. However, over the past 7 days, she ahs been having a significant cough issue. She notes her granddaughter was recently tested positive for RSV. She denies any shortness of breath.  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   Hypertensive disorder 09/25/2012   Lumbar radiculopathy, chronic 09/25/2012   Cervical radiculopathy 09/25/2012   Past Surgical History:  Procedure Laterality Date   ABDOMINAL HYSTERECTOMY  2002   have ovaries   BREAST SURGERY  2006   left, cancer   EYE SURGERY  2012   KNEE SURGERY Left    SPINE SURGERY  1981 and 2007   TOTAL KNEE ARTHROPLASTY Right 03/07/2020   Procedure: TOTAL KNEE ARTHROPLASTY;  Surgeon: Ernie Cough, MD;   Location: [facility];  Service: Orthopedics;  Laterality: Right;  70 mins   TUBAL LIGATION     Family History  Problem Relation Age of Onset   Diabetes Mother        age early 71's   Heart disease Mother        age early 30's or 50's   Hypertension Mother    Hyperlipidemia Mother    Stroke Father 42       per pt mini   Diabetes Sister 32       type II   Hyperlipidemia Sister    Hypertension Sister    Kidney disease Brother        polycystic kidney   Diabetes Brother    Heart disease Brother    Alcohol abuse Maternal Grandfather    Cancer Maternal Grandmother        Leukemia   Cancer Paternal Aunt 53       Breast   Outpatient Medications Prior to Visit  Medication Sig Dispense Refill   acetaminophen  (TYLENOL ) 650 MG CR tablet Tylenol  Arthritis Pain 650 mg tablet,extended release   1 tablet as needed by oral route.     amoxicillin -clavulanate (AUGMENTIN ) 875-125 MG tablet Take 1 tablet by mouth 2 (two) times daily. 20 tablet 0   aspirin  81 MG EC tablet Adult Low Dose Aspirin  81 mg tablet,delayed release   1 tablet every day by oral route.     baclofen  (LIORESAL ) 10 MG tablet Take 1 tablet (  10 mg total) by mouth 3 (three) times daily as needed for muscle spasms. 270 tablet 1   Black Pepper-Turmeric 3-500 MG CAPS      Camphor-Menthol -Methyl Sal (SALONPAS) 3.10-26-08 % PTCH 1 patch as needed by topical route.     Cholecalciferol (VITAMIN D ) 125 MCG (5000 UT) CAPS Take 5,000 Units by mouth daily.      lisinopril  (ZESTRIL ) 10 MG tablet Take 1 tablet (10 mg total) by mouth daily. 90 tablet 3   Loratadine-Pseudoephedrine (CLARITIN-D 12 HOUR PO) Take by mouth.     meloxicam  (MOBIC ) 15 MG tablet Take 0.5-1 tablets (7.5-15 mg total) by mouth daily as needed for pain. With food 90 tablet 3   Multiple Vitamin (MULTIVITAMIN) tablet Take 1 tablet by mouth daily.     Omega-3 Fatty Acids (FISH OIL) 1000 MG CAPS Take 2,000 mg by mouth daily.     No facility-administered medications prior to visit.    Allergies  Allergen Reactions   Hydrocodone -Acetaminophen  Itching and Hives    Hives, itching Hives, itching    Albuterol  Other (See Comments) and Palpitations    Heart race, tremble and studdering Heart race, tremble and studdering Heart race, tremble and studdering   Codeine Other (See Comments)    DIZZINESS Confusion and dizziness DIZZINESS Confusion/hallucinations/dizziness Confusion/hallucinations/dizziness    Pravastatin  Sodium Other (See Comments) and Nausea And Vomiting    Per patient caused headaches and GI upset Per patient caused headaches and GI upset Per patient caused headaches and GI upset      Objective:   Today's Vitals   10/30/23 1018  BP: 122/68  Pulse: (!) 101  Temp: (!) 97.4 F (36.3 C)  TempSrc: Temporal  SpO2: 98%  Weight: 161 lb 6.4 oz (73.2 kg)  Height: 5' 5 (1.651 m)   Body mass index is 26.86 kg/m.   General: Well developed, well nourished. No acute distress. HEENT: Normocephalic, non-traumatic. Conjunctiva clear. External ears normal. EAC and TMs normal   bilaterally. Nose clear without congestion or rhinorrhea. Mucous membranes moist. Oropharynx clear. Good   dentition. Neck: Supple. No lymphadenopathy. No thyromegaly. Lungs: Clear to auscultation bilaterally. No wheezing, rales or rhonchi. Psych: Alert and oriented. Normal mood and affect.  Health Maintenance Due  Topic Date Due   Medicare Annual Wellness (AWV)  08/22/2023     Assessment & Plan:  1. Bronchitis (Primary) 2. Exposure to respiratory syncytial virus (RSV)  I suspect that her current symptoms likely do represent RSV infection. I do not hear any lower respiratory wheezing or rhonchi and she is oxygenating well. I discussed potential risk for progressive chest symptoms with RSV. Should she develop dyspnea or a low O2 level, she should return for evaluation. In the meantime, I recommend she hydrate well, continue OTC meds as needed, and I will provide a cough  medicine.  - promethazine -dextromethorphan (PROMETHAZINE -DM) 6.25-15 MG/5ML syrup; Take 5 mLs by mouth 4 (four) times daily as needed for cough.  Dispense: 118 mL; Refill: 1  Return if symptoms worsen or fail to improve.   Garnette CHRISTELLA Simpler, MD

## 2023-11-04 DIAGNOSIS — L905 Scar conditions and fibrosis of skin: Secondary | ICD-10-CM | POA: Diagnosis not present

## 2024-01-27 DIAGNOSIS — L821 Other seborrheic keratosis: Secondary | ICD-10-CM | POA: Diagnosis not present

## 2024-01-27 DIAGNOSIS — Z85828 Personal history of other malignant neoplasm of skin: Secondary | ICD-10-CM | POA: Diagnosis not present

## 2024-02-03 DIAGNOSIS — Z96652 Presence of left artificial knee joint: Secondary | ICD-10-CM | POA: Diagnosis not present

## 2024-02-19 DIAGNOSIS — H18233 Secondary corneal edema, bilateral: Secondary | ICD-10-CM | POA: Diagnosis not present

## 2024-03-20 ENCOUNTER — Other Ambulatory Visit: Payer: Self-pay

## 2024-03-20 ENCOUNTER — Ambulatory Visit
Admission: RE | Admit: 2024-03-20 | Discharge: 2024-03-20 | Disposition: A | Discharge status: Home / Self Care | Source: Ambulatory Visit | Attending: Physician Assistant | Admitting: Physician Assistant

## 2024-03-20 VITALS — BP 142/89 | HR 82 | Temp 97.9°F | Resp 18 | Ht 65.0 in | Wt 162.0 lb

## 2024-03-20 DIAGNOSIS — W57XXXA Bitten or stung by nonvenomous insect and other nonvenomous arthropods, initial encounter: Secondary | ICD-10-CM

## 2024-03-20 DIAGNOSIS — S30860A Insect bite (nonvenomous) of lower back and pelvis, initial encounter: Secondary | ICD-10-CM

## 2024-03-20 DIAGNOSIS — R21 Rash and other nonspecific skin eruption: Secondary | ICD-10-CM | POA: Diagnosis not present

## 2024-03-20 MED ORDER — DOXYCYCLINE HYCLATE 100 MG PO CAPS: 100.0000 mg | ORAL_CAPSULE | Freq: Two times a day (BID) | ORAL | 0 refills | Status: AC

## 2024-03-20 NOTE — ED Provider Notes (Signed)
 [name]    CSN: [id number] Arrival date & time: 03/20/24  1111      History   Chief Complaint Chief Complaint  Patient presents with   Tick Removal    Tick was removed about a week ago. Have a "bullseye". Concerned about infection. - Entered by patient    HPI [name] is a 78 y.o. female.   HPI  Pt reports concerns for tick bite She states her husband removed one about a week ago  She states she noticed another area near the bite that had a red ring around it. She does not recall removing a tick from this area  She reports feeling fatigued and tired and states her BP is elevated as well    Past Medical History:  Diagnosis Date   Arthritis    Cancer (HCC) 01/07/2006   Breast cancer- L Breast   Cataract    Complication of anesthesia    Hypertension    Injury of ligament of hand    right thumb ulnar collateral ligament injury.  recommend MR arthrogram of the thumb.   Neuromuscular disorder (HCC)    scoliosis s/p spine surgery, has arthritis and pinched nerves   PONV (postoperative nausea and vomiting)    Scoliosis     Patient Active Problem List   Diagnosis Date Noted   Routine general medical examination at a health care facility 08/06/2023   Chronic right shoulder pain 01/29/2023   Prediabetes 07/29/2022   Osteopenia 07/12/2021   History of total knee arthroplasty, bilateral 01/15/2021   Overweight (BMI 25.0-29.9) 03/08/2020   History of total knee arthroplasty 03/07/2020   Hyperlipidemia 03/06/2017   History of malignant neoplasm of breast 12/27/2013   Disorder of wrist joint 02/02/2013   Hypertensive disorder 09/25/2012   Lumbar radiculopathy, chronic 09/25/2012   Cervical radiculopathy 09/25/2012    Past Surgical History:  Procedure Laterality Date   ABDOMINAL HYSTERECTOMY  2002   have ovaries   BREAST SURGERY  2006   left, cancer   EYE SURGERY  2012   KNEE SURGERY Left    SPINE SURGERY  1981 and 2007   TOTAL KNEE  ARTHROPLASTY Right 03/07/2020   Procedure: TOTAL KNEE ARTHROPLASTY;  Surgeon: Claiborne Crew, MD;  Location: [facility];  Service: Orthopedics;  Laterality: Right;  70 mins   TUBAL LIGATION      OB History   No obstetric history on file.      Home Medications    Prior to Admission medications   Medication Sig Start Date End Date Taking? Authorizing Provider  doxycycline (VIBRAMYCIN) 100 MG capsule Take 1 capsule (100 mg total) by mouth 2 (two) times daily for 10 days. 03/20/24 03/30/24 Yes Maveryk Renstrom E, PA-C  acetaminophen  (TYLENOL ) 650 MG CR tablet Tylenol  Arthritis Pain 650 mg tablet,extended release   1 tablet as needed by oral route.    [provider]  aspirin  81 MG EC tablet Adult Low Dose Aspirin  81 mg tablet,delayed release   1 tablet every day by oral route.    [provider]  baclofen  (LIORESAL ) 10 MG tablet Take 1 tablet (10 mg total) by mouth 3 (three) times daily as needed for muscle spasms. 08/06/23   McElwee, Adolfo Hooker, NP  Black Pepper-Turmeric 3-500 MG CAPS  09/24/21   [provider]  Camphor-Menthol -Methyl Sal (SALONPAS) 3.10-26-08 % PTCH 1 patch as needed by topical route. 08/12/18   [provider]  Cholecalciferol (VITAMIN D ) 125 MCG (5000 UT) CAPS  Take 5,000 Units by mouth daily.     [provider]  lisinopril  (ZESTRIL ) 10 MG tablet Take 1 tablet (10 mg total) by mouth daily. 08/06/23   McElwee, Lauren A, NP  Loratadine-Pseudoephedrine (CLARITIN-D 12 HOUR PO) Take by mouth.    [provider]  meloxicam  (MOBIC ) 15 MG tablet Take 0.5-1 tablets (7.5-15 mg total) by mouth daily as needed for pain. With food 08/06/23   McElwee, Lauren A, NP  Multiple Vitamin (MULTIVITAMIN) tablet Take 1 tablet by mouth daily.    [provider]  Omega-3 Fatty Acids (FISH OIL) 1000 MG CAPS Take 2,000 mg by mouth daily.    [provider]  promethazine -dextromethorphan (PROMETHAZINE -DM) 6.25-15 MG/5ML syrup Take 5 mLs by  mouth 4 (four) times daily as needed for cough. 10/30/23   Graig Lawyer, MD    Family History Family History  Problem Relation Age of Onset   Diabetes Mother        age early 75's   Heart disease Mother        age early 53's or 54's   Hypertension Mother    Hyperlipidemia Mother    Stroke Father 51       per pt mini   Diabetes Sister 66       type II   Hyperlipidemia Sister    Hypertension Sister    Kidney disease Brother        polycystic kidney   Diabetes Brother    Heart disease Brother    Alcohol abuse Maternal Grandfather    Cancer Maternal Grandmother        Leukemia   Cancer Paternal Aunt 48       Breast    Social History Social History   Tobacco Use   Smoking status: Never   Smokeless tobacco: Never  Vaping Use   Vaping status: Never Used  Substance Use Topics   Alcohol use: No   Drug use: No     Allergies   Hydrocodone -acetaminophen , Albuterol , Codeine, and Pravastatin  sodium   Review of Systems Review of Systems  Constitutional:  Positive for fatigue. Negative for chills and fever.  Musculoskeletal:  Positive for myalgias.  Skin:  Positive for rash.       Concern for tick bite   Neurological:  Positive for light-headedness and headaches.     Physical Exam Triage Vital Signs ED Triage Vitals  Encounter Vitals Group     BP 03/20/24 1136 (!) 142/89     Systolic BP Percentile --      Diastolic BP Percentile --      Pulse Rate 03/20/24 1136 82     Resp 03/20/24 1136 18     Temp 03/20/24 1136 97.9 F (36.6 C)     Temp Source 03/20/24 1136 Oral     SpO2 03/20/24 1136 96 %     Weight 03/20/24 1138 162 lb (73.5 kg)     Height 03/20/24 1138 5\' 5"  (1.651 m)     Head Circumference --      Peak Flow --      Pain Score 03/20/24 1138 0     Pain Loc --      Pain Education --      Exclude from Growth Chart --    No data found.  Updated Vital Signs BP (!) 142/89 (BP Location: Right Arm)   Pulse 82   Temp 97.9 F (36.6 C) (Oral)   Resp 18    Ht 5\' 5"  (1.651  m)   Wt 162 lb (73.5 kg)   SpO2 96%   BMI 26.96 kg/m   Visual Acuity Right Eye Distance:   Left Eye Distance:   Bilateral Distance:    Right Eye Near:   Left Eye Near:    Bilateral Near:     Physical Exam Vitals reviewed.  Constitutional:      General: She is awake. She is not in acute distress.    Appearance: Normal appearance. She is well-developed and well-groomed. She is not ill-appearing or toxic-appearing.  HENT:     Head: Normocephalic and atraumatic.  Pulmonary:     Effort: Pulmonary effort is normal.  Skin:    General: Skin is warm and dry.     Findings: Rash present. Rash is macular and scaling.          Comments: Patient has approximately 2 cm wide circular rash that is macular and scaling in nature.  Rash is not erythematous, warm to the touch, raised or tender.   Neurological:     General: No focal deficit present.     Mental Status: She is alert and oriented to person, place, and time.  Psychiatric:        Mood and Affect: Mood normal.        Behavior: Behavior normal. Behavior is cooperative.      Hogan Treatments / Results  Labs (all labs ordered are listed, but only abnormal results are displayed) Labs Reviewed - No data to display   EKG   Radiology No results found.  Procedures Procedures (including critical care time)  Medications Ordered in Hogan Medications - No data to display  Initial Impression / Assessment and Plan / Hogan Course  I have reviewed the triage vital signs and the nursing notes.  Pertinent labs & imaging results that were available during my care of the patient were reviewed by me and considered in my medical decision making (see chart for details).      Final Clinical Impressions(s) / Hogan Diagnoses   Final diagnoses:  Insect bite of lower back, initial encounter  Rash and nonspecific skin eruption   Patient presents today with concerns for tick bite.  She reports that her husband removed a tick  from her trunk about a week ago and then she noticed another rash office that this after bathing.  She does report some mild fatigue as well as elevated blood pressure for the last week.  Physical exam reveals circular macular rash with central scaling and clearance that appears consistent with either dry skin or potential tinea infection.   Given the fact the patient is reporting fatigue, body aches, headaches as well as blood pressure changes I am going to go ahead and send in doxycycline p.o. twice daily x 10 days for management of symptoms and treatment of potential tickborne illness.  At this time recommend applying small amount of Aquaphor or Vaseline to rash area and if this does not help with resolution reviewed that she should follow-up with primary care provider as she may need topical treatment for tinea infection.  Follow-up as needed for progressing or persistent symptoms    Discharge Instructions      You were seen today for concerns of a tick bite along your right hip.    The area of concern does not appear consistent with a typical tick bite rash but instead resembles something called a tinea infection or dry skin.  This is a mild fungal infection that typically resolves with  moisturization and can sometimes need an antifungal cream if it is not improving.    Given the fact that you are also developing body aches, headaches, fatigue I am sending in an antibiotic to help treat tickborne illness.  This is a medication called doxycycline and you should take it by mouth twice per day for 10 days.  Please make sure that you finish the entire course of the medication to ensure adequate treatment and resolution.  Please stop the medication if you are directed to do so by a medical provider or if you develop an allergic reaction.  I recommend applying a small amount of Vaseline or Aquaphor to the area for the next week or 2 and if the area does not appear to be improving I recommend following  up with your primary care provider for further evaluation.   ED Prescriptions     Medication Sig Dispense Auth. Provider   doxycycline (VIBRAMYCIN) 100 MG capsule Take 1 capsule (100 mg total) by mouth 2 (two) times daily for 10 days. 20 capsule Nicole Hafley E, PA-C      PDMP not reviewed this encounter.   Abdulmalik Darco, Pearla Bottom, PA-C 03/20/24 1308

## 2024-03-20 NOTE — ED Notes (Signed)
 This RN attempted blood draw x 2 in left AC and right hand. Unsuccessful. Erin PA made aware.

## 2024-03-20 NOTE — ED Triage Notes (Addendum)
 Pt presents with complaints of itching and redness in lower back area. Pt states her husband removed one tick about a week ago. States "I believe I removed another one with my body scrubber in shower." Pt currently denies pain, just voices discomfort. "Bullseye on the right side of my back." Pt is also complaining of headaches and elevated blood pressure readings.

## 2024-03-20 NOTE — Discharge Instructions (Addendum)
 You were seen today for concerns of a tick bite along your right hip.    The area of concern does not appear consistent with a typical tick bite rash but instead resembles something called a tinea infection or dry skin.  This is a mild fungal infection that typically resolves with moisturization and can sometimes need an antifungal cream if it is not improving.    Given the fact that you are also developing body aches, headaches, fatigue I am sending in an antibiotic to help treat tickborne illness.  This is a medication called doxycycline and you should take it by mouth twice per day for 10 days.  Please make sure that you finish the entire course of the medication to ensure adequate treatment and resolution.  Please stop the medication if you are directed to do so by a medical provider or if you develop an allergic reaction.  I recommend applying a small amount of Vaseline or Aquaphor to the area for the next week or 2 and if the area does not appear to be improving I recommend following up with your primary care provider for further evaluation.

## 2024-05-22 ENCOUNTER — Other Ambulatory Visit: Payer: Self-pay | Admitting: Nurse Practitioner

## 2024-05-24 NOTE — Telephone Encounter (Signed)
 Requesting: BACLOFEN  10 MG TABLET  Last Visit: 08/06/2023 Next Visit: 08/06/2024 Last Refill: 08/06/2023  Please Advise

## 2024-07-29 DIAGNOSIS — C441121 Basal cell carcinoma of skin of right upper eyelid, including canthus: Secondary | ICD-10-CM | POA: Diagnosis not present

## 2024-07-29 DIAGNOSIS — D692 Other nonthrombocytopenic purpura: Secondary | ICD-10-CM | POA: Diagnosis not present

## 2024-07-29 DIAGNOSIS — D485 Neoplasm of uncertain behavior of skin: Secondary | ICD-10-CM | POA: Diagnosis not present

## 2024-07-29 DIAGNOSIS — L821 Other seborrheic keratosis: Secondary | ICD-10-CM | POA: Diagnosis not present

## 2024-07-29 DIAGNOSIS — Z85828 Personal history of other malignant neoplasm of skin: Secondary | ICD-10-CM | POA: Diagnosis not present

## 2024-07-29 DIAGNOSIS — D224 Melanocytic nevi of scalp and neck: Secondary | ICD-10-CM | POA: Diagnosis not present

## 2024-07-29 DIAGNOSIS — D225 Melanocytic nevi of trunk: Secondary | ICD-10-CM | POA: Diagnosis not present

## 2024-07-29 DIAGNOSIS — Z08 Encounter for follow-up examination after completed treatment for malignant neoplasm: Secondary | ICD-10-CM | POA: Diagnosis not present

## 2024-08-06 ENCOUNTER — Ambulatory Visit: Payer: Self-pay | Admitting: Nurse Practitioner

## 2024-08-06 ENCOUNTER — Encounter: Payer: Self-pay | Admitting: Nurse Practitioner

## 2024-08-06 ENCOUNTER — Ambulatory Visit: Admitting: Nurse Practitioner

## 2024-08-06 VITALS — BP 122/80 | HR 74 | Temp 97.1°F | Ht 65.0 in | Wt 162.4 lb

## 2024-08-06 DIAGNOSIS — E782 Mixed hyperlipidemia: Secondary | ICD-10-CM | POA: Diagnosis not present

## 2024-08-06 DIAGNOSIS — M5416 Radiculopathy, lumbar region: Secondary | ICD-10-CM

## 2024-08-06 DIAGNOSIS — M8589 Other specified disorders of bone density and structure, multiple sites: Secondary | ICD-10-CM | POA: Diagnosis not present

## 2024-08-06 DIAGNOSIS — Z23 Encounter for immunization: Secondary | ICD-10-CM | POA: Diagnosis not present

## 2024-08-06 DIAGNOSIS — I10 Essential (primary) hypertension: Secondary | ICD-10-CM

## 2024-08-06 DIAGNOSIS — R7303 Prediabetes: Secondary | ICD-10-CM | POA: Diagnosis not present

## 2024-08-06 DIAGNOSIS — Z0000 Encounter for general adult medical examination without abnormal findings: Secondary | ICD-10-CM

## 2024-08-06 DIAGNOSIS — F4329 Adjustment disorder with other symptoms: Secondary | ICD-10-CM | POA: Insufficient documentation

## 2024-08-06 LAB — LIPID PANEL
Cholesterol: 236 mg/dL — ABNORMAL HIGH (ref 0–200)
HDL: 45.6 mg/dL (ref >39.00)
LDL Cholesterol: 160 mg/dL — ABNORMAL HIGH (ref 0–99)
NonHDL: 190.86
Total CHOL/HDL Ratio: 5
Triglycerides: 155 mg/dL — ABNORMAL HIGH (ref 0.0–149.0)
VLDL: 31 mg/dL (ref 0.0–40.0)

## 2024-08-06 LAB — CBC WITH DIFFERENTIAL/PLATELET
Basophils Absolute: 0 K/uL (ref 0.0–0.1)
Basophils Relative: 0.8 % (ref 0.0–3.0)
Eosinophils Absolute: 0.1 K/uL (ref 0.0–0.7)
Eosinophils Relative: 2.1 % (ref 0.0–5.0)
HCT: 38.8 % (ref 36.0–46.0)
Hemoglobin: 12.8 g/dL (ref 12.0–15.0)
Lymphocytes Relative: 42.3 % (ref 12.0–46.0)
Lymphs Abs: 2.1 K/uL (ref 0.7–4.0)
MCHC: 33.1 g/dL (ref 30.0–36.0)
MCV: 93.3 fl (ref 78.0–100.0)
Monocytes Absolute: 0.4 K/uL (ref 0.1–1.0)
Monocytes Relative: 8 % (ref 3.0–12.0)
Neutro Abs: 2.4 K/uL (ref 1.4–7.7)
Neutrophils Relative %: 46.8 % (ref 43.0–77.0)
Platelets: 206 K/uL (ref 150.0–400.0)
RBC: 4.15 Mil/uL (ref 3.87–5.11)
RDW: 12.1 % (ref 11.5–15.5)
WBC: 5.1 K/uL (ref 4.0–10.5)

## 2024-08-06 LAB — COMPREHENSIVE METABOLIC PANEL WITH GFR
ALT: 16 U/L (ref 0–35)
AST: 18 U/L (ref 0–37)
Albumin: 4.4 g/dL (ref 3.5–5.2)
Alkaline Phosphatase: 46 U/L (ref 39–117)
BUN: 20 mg/dL (ref 6–23)
CO2: 25 meq/L (ref 19–32)
Calcium: 9.4 mg/dL (ref 8.4–10.5)
Chloride: 105 meq/L (ref 96–112)
Creatinine, Ser: 0.94 mg/dL (ref 0.40–1.20)
GFR: 58.2 mL/min — ABNORMAL LOW (ref >60.00)
Glucose, Bld: 96 mg/dL (ref 70–99)
Potassium: 3.8 meq/L (ref 3.5–5.1)
Sodium: 139 meq/L (ref 135–145)
Total Bilirubin: 0.6 mg/dL (ref 0.2–1.2)
Total Protein: 7.2 g/dL (ref 6.0–8.3)

## 2024-08-06 LAB — HEMOGLOBIN A1C: Hgb A1c MFr Bld: 6.1 % (ref 4.6–6.5)

## 2024-08-06 MED ORDER — COVID-19 MRNA VACC (MODERNA) 50 MCG/0.5ML IM SUSY: 0.5000 mL | PREFILLED_SYRINGE | Freq: Once | INTRAMUSCULAR | 0 refills | Status: AC

## 2024-08-06 MED ORDER — PAROXETINE HCL 10 MG PO TABS: 10.0000 mg | ORAL_TABLET | Freq: Every day | ORAL | 0 refills | Status: DC

## 2024-08-06 MED ORDER — LISINOPRIL 10 MG PO TABS: 10.0000 mg | ORAL_TABLET | Freq: Every day | ORAL | 3 refills | Status: AC

## 2024-08-06 NOTE — Assessment & Plan Note (Addendum)
 Chronic, ongoing. Blood pressure has been elevated at 140s-150s at home, but has been having increased stress. BP here today 122/80. Continue lisinopril  10mg  daily. Check CMP, CBC, lipid panel today. Follow-up in 4-6 weeks

## 2024-08-06 NOTE — Assessment & Plan Note (Signed)
 Chronic, ongoing. She has had allergies to atorvastatin, simvastatin, pravastatin  and didn't tolerate zetia .  She declined starting Repatha  due to potential side effects.  Check CMP, CBC, lipid panel today.  She has been adjusting her diet and trying to eat healthier.

## 2024-08-06 NOTE — Progress Notes (Unsigned)
 BP 128/82 (BP Location: Left Arm, Patient Position: Sitting, Cuff Size: Normal)   Pulse 74   Temp (!) 97.1 F (36.2 C)   Ht 5' 5 (1.651 m)   Wt 162 lb 6.4 oz (73.7 kg)   SpO2 97%   BMI 27.02 kg/m    Subjective:    Patient ID: [name], female    DOB: 02/26/1946, 78 y.o.   MRN: [id number]  CC: Chief Complaint  Patient presents with   Annual Exam    With fasting labs, Covid Rx, Flu Vaccine, concerns with elevated BP and Stress    HPI: [name] is a 78 y.o. female presenting on 08/06/2024 for comprehensive medical examination. Current medical complaints include:{Blank single:19197::none,***}  She currently lives with: Menopausal Symptoms: {Blank single:19197::yes,no}  Depression and Anxiety Screen done today and results listed below:     08/06/2023    9:23 AM 01/29/2023    9:41 AM 08/21/2022    2:45 PM 07/29/2022    8:33 AM 05/10/2022    9:29 AM  Depression screen PHQ 2/9  Decreased Interest 0 0 0 0 0  Down, Depressed, Hopeless 0 0 0 0 0  PHQ - 2 Score 0 0 0 0 0  Altered sleeping 1 1 0 2   Tired, decreased energy 0 0 0 1   Change in appetite 0 0 0 0   Feeling bad or failure about yourself  0 0 0 0   Trouble concentrating 0 0 0 0   Moving slowly or fidgety/restless 0 0 0 0   Suicidal thoughts 0 0 0 0   PHQ-9 Score 1 1 0 3   Difficult doing work/chores Somewhat difficult Not difficult at all Not difficult at all Not difficult at all       08/06/2023    9:23 AM 01/29/2023    9:42 AM 07/29/2022    8:34 AM 07/12/2021   11:03 AM  GAD 7 : Generalized Anxiety Score  Nervous, Anxious, on Edge 0 0 0 0  Control/stop worrying 0 0 1 0  Worry too much - different things 0 1 1 1   Trouble relaxing 0 0 1 0  Restless 0 1 0 0  Easily annoyed or irritable 0 0 0 0  Afraid - awful might happen 0 0 1 0  Total GAD 7 Score 0 2 4 1   Anxiety Difficulty Not difficult at all Not difficult at all Not difficult at all Not difficult at all    The patient {has/does not  have:19849} a history of falls. I {did/did not:19850} complete a risk assessment for falls. A plan of care for falls {was/was not:19852} documented.   Past Medical History:  Past Medical History:  Diagnosis Date   Arthritis    Cancer (HCC) 01/07/2006   Breast cancer- L Breast   Cataract    Complication of anesthesia    Hypertension    Injury of ligament of hand    right thumb ulnar collateral ligament injury.  recommend MR arthrogram of the thumb.   Neuromuscular disorder (HCC)    scoliosis s/p spine surgery, has arthritis and pinched nerves   PONV (postoperative nausea and vomiting)    Scoliosis     Surgical History:  Past Surgical History:  Procedure Laterality Date   ABDOMINAL HYSTERECTOMY  2002   have ovaries   BREAST SURGERY  2006   left, cancer   EYE SURGERY  2012   KNEE SURGERY Left    SPINE SURGERY  1981 and 2007   TOTAL KNEE ARTHROPLASTY Right 03/07/2020   Procedure: TOTAL KNEE ARTHROPLASTY;  Surgeon: Ernie Cough, MD;  Location: [facility];  Service: Orthopedics;  Laterality: Right;  70 mins   TUBAL LIGATION      Medications:  Current Outpatient Medications on File Prior to Visit  Medication Sig   acetaminophen  (TYLENOL ) 650 MG CR tablet Tylenol  Arthritis Pain 650 mg tablet,extended release   1 tablet as needed by oral route. (Patient taking differently: Take 650 mg by mouth as needed.)   aspirin  81 MG EC tablet Adult Low Dose Aspirin  81 mg tablet,delayed release   1 tablet every day by oral route.   baclofen  (LIORESAL ) 10 MG tablet TAKE 1 TABLET BY MOUTH THREE TIMES A DAY AS NEEDED FOR MUSCLE SPASMS   Black Pepper-Turmeric 3-500 MG CAPS    Camphor-Menthol -Methyl Sal (SALONPAS) 3.10-26-08 % PTCH 1 patch as needed by topical route. (Patient taking differently: as needed.)   Cholecalciferol (VITAMIN D ) 125 MCG (5000 UT) CAPS Take 5,000 Units by mouth daily.    meloxicam  (MOBIC ) 15 MG tablet Take 0.5-1 tablets (7.5-15 mg total) by mouth daily as needed for pain. With  food   Multiple Vitamin (MULTIVITAMIN) tablet Take 1 tablet by mouth daily.   Omega-3 Fatty Acids (FISH OIL) 1000 MG CAPS Take 2,000 mg by mouth daily.   No current facility-administered medications on file prior to visit.    Allergies:  Allergies  Allergen Reactions   Hydrocodone -Acetaminophen  Itching and Hives    Hives, itching Hives, itching    Albuterol  Other (See Comments) and Palpitations    Heart race, tremble and studdering Heart race, tremble and studdering Heart race, tremble and studdering   Codeine Other (See Comments)    DIZZINESS Confusion and dizziness DIZZINESS Confusion/hallucinations/dizziness Confusion/hallucinations/dizziness    Pravastatin  Sodium Other (See Comments) and Nausea And Vomiting    Per patient caused headaches and GI upset Per patient caused headaches and GI upset Per patient caused headaches and GI upset     Social History:  Social History   Socioeconomic History   Marital status: Married    Spouse name: Not on file   Number of children: Not on file   Years of education: Not on file   Highest education level: Some college, no degree  Occupational History   Occupation: Retired  Tobacco Use   Smoking status: Never   Smokeless tobacco: Never  Vaping Use   Vaping status: Never Used  Substance and Sexual Activity   Alcohol use: No   Drug use: No   Sexual activity: Yes  Other Topics Concern   Not on file  Social History Narrative   Married   Education: High School   Exercise: two times a week   Social Drivers of Corporate investment banker Strain: Low Risk  (08/01/2024)   Overall Financial Resource Strain (CARDIA)    Difficulty of Paying Living Expenses: Not very hard  Food Insecurity: No Food Insecurity (08/01/2024)   Hunger Vital Sign    Worried About Running Out of Food in the Last Year: Never true    Ran Out of Food in the Last Year: Never true  Transportation Needs: No Transportation Needs (08/01/2024)   PRAPARE -  Administrator, Civil Service (Medical): No    Lack of Transportation (Non-Medical): No  Physical Activity: Sufficiently Active (08/01/2024)   Exercise Vital Sign    Days of Exercise per Week: 4 days    Minutes of Exercise  per Session: 60 min  Stress: Stress Concern Present (08/01/2024)   [employer] of Occupational Health - Occupational Stress Questionnaire    Feeling of Stress: Very much  Social Connections: Socially Integrated (08/01/2024)   Social Connection and Isolation Panel    Frequency of Communication with Friends and Family: More than three times a week    Frequency of Social Gatherings with Friends and Family: Twice a week    Attends Religious Services: More than 4 times per year    Active Member of [employer] or Organizations: Yes    Attends Engineer, structural: More than 4 times per year    Marital Status: Living with partner  Intimate Partner Violence: Not At Risk (08/21/2022)   Humiliation, Afraid, Rape, and Kick questionnaire    Fear of Current or Ex-Partner: No    Emotionally Abused: No    Physically Abused: No    Sexually Abused: No   Social History   Tobacco Use  Smoking Status Never  Smokeless Tobacco Never   Social History   Substance and Sexual Activity  Alcohol Use No    Family History:  Family History  Problem Relation Age of Onset   Diabetes Mother        age early 13's   Heart disease Mother        age early 35's or 31's   Hypertension Mother    Hyperlipidemia Mother    Stroke Father 61       per pt mini   Diabetes Sister 38       type II   Hyperlipidemia Sister    Hypertension Sister    Kidney disease Brother        polycystic kidney   Diabetes Brother    Heart disease Brother    Alcohol abuse Maternal Grandfather    Cancer Maternal Grandmother        Leukemia   Cancer Paternal Aunt 23       Breast    Past medical history, surgical history, medications, allergies, family history and social history reviewed  with patient today and changes made to appropriate areas of the chart.   ROS All other ROS negative except what is listed above and in the HPI.      Objective:    BP 128/82 (BP Location: Left Arm, Patient Position: Sitting, Cuff Size: Normal)   Pulse 74   Temp (!) 97.1 F (36.2 C)   Ht 5' 5 (1.651 m)   Wt 162 lb 6.4 oz (73.7 kg)   SpO2 97%   BMI 27.02 kg/m   Wt Readings from Last 3 Encounters:  08/06/24 162 lb 6.4 oz (73.7 kg)  03/20/24 162 lb (73.5 kg)  10/30/23 161 lb 6.4 oz (73.2 kg)    Physical Exam  Results for orders placed or performed in visit on 08/25/23  [facility] MAMMOGRAPHY   Collection Time: 08/20/23 12:00 AM  Result Value Ref Range   HM Mammogram 0-4 Bi-Rad 0-4 Bi-Rad, Self Reported Normal      Assessment & Plan:   Problem List Items Addressed This Visit       Cardiovascular and Mediastinum   Hypertensive disorder   Chronic, stable. Continue lisinopril  10mg  daily. Check CMP, CBC, lipid panel today. Follow-up in 1 year      Relevant Medications   lisinopril  (ZESTRIL ) 10 MG tablet   Other Relevant Orders   CBC with Differential/Platelet   Comprehensive metabolic panel with GFR     Nervous and Auditory  Lumbar radiculopathy, chronic   Chronic, stable. Continue baclofen  10mg  TID prn. Continue regular stretching.         Musculoskeletal and Integument   Osteopenia   Last DEXA scan showed a T score of -1.2 on 07/19/2020.  We will repeat DEXA in 2026.        Other   Hyperlipidemia   Chronic, ongoing. She has had allergies to atorvastatin, simvastatin, pravastatin  and didn't tolerate zetia .  She declined starting Repatha  due to potential side effects.  Check CMP, CBC, lipid panel today.  She has been adjusting her diet and trying to eat healthier.      Relevant Medications   lisinopril  (ZESTRIL ) 10 MG tablet   Other Relevant Orders   CBC with Differential/Platelet   Comprehensive metabolic panel with GFR   Lipid panel   Prediabetes   Check A1c  today and treat based on results.       Relevant Orders   Hemoglobin A1c   Routine general medical examination at a health care facility - Primary   Health maintenance reviewed and updated. Discussed nutrition, exercise. Follow-up 1 year.        Other Visit Diagnoses       Immunization due       Flu vaccine given today, covid-19 RX printed.   Relevant Medications   COVID-19 mRNA vaccine (SPIKEVAX) syringe   Other Relevant Orders   Flu vaccine HIGH DOSE PF(Fluzone Trivalent)        Follow up plan: No follow-ups on file.   LABORATORY TESTING:  - Pap smear: not applicable  IMMUNIZATIONS:   - Tdap: Tetanus vaccination status reviewed: last tetanus booster within 10 years. - Influenza: {Blank single:19197::Up to date,Administered today,Postponed to flu season,Declined,Given elsewhere} - Pneumovax: Up to date - Prevnar: Up to date - HPV: Not applicable - Shingrix vaccine: Declined  SCREENING: -Mammogram: Up to date  - Colonoscopy: Not applicable  - Bone Density: Up to date   PATIENT COUNSELING:   Advised to take 1 mg of folate supplement per day if capable of pregnancy.   Sexuality: Discussed sexually transmitted diseases, partner selection, use of condoms, avoidance of unintended pregnancy  and contraceptive alternatives.   Advised to avoid cigarette smoking.  I discussed with the patient that most people either abstain from alcohol or drink within safe limits (<=14/week and <=4 drinks/occasion for males, <=7/weeks and <= 3 drinks/occasion for females) and that the risk for alcohol disorders and other health effects rises proportionally with the number of drinks per week and how often a drinker exceeds daily limits.  Discussed cessation/primary prevention of drug use and availability of treatment for abuse.   Diet: Encouraged to adjust caloric intake to maintain  or achieve ideal body weight, to reduce intake of dietary saturated fat and total fat, to limit  sodium intake by avoiding high sodium foods and not adding table salt, and to maintain adequate dietary potassium and calcium preferably from fresh fruits, vegetables, and low-fat dairy products.    stressed the importance of regular exercise  Injury prevention: Discussed safety belts, safety helmets, smoke detector, smoking near bedding or upholstery.   Dental health: Discussed importance of regular tooth brushing, flossing, and dental visits.    NEXT PREVENTATIVE PHYSICAL DUE IN 1 YEAR. No follow-ups on file.  Landen Breeland A Yash Cacciola

## 2024-08-06 NOTE — Assessment & Plan Note (Signed)
Check A1c today and treat based on results.  

## 2024-08-06 NOTE — Assessment & Plan Note (Signed)
Last DEXA scan showed a T score of -1.2 on 07/19/2020.  We will repeat DEXA in 2026.

## 2024-08-06 NOTE — Assessment & Plan Note (Addendum)
 Chronic, stable. Continue meloxicam  7.5-15mg  daily as needed and baclofen  10mg  TID prn. Continue regular stretching.

## 2024-08-06 NOTE — Assessment & Plan Note (Signed)
 Health maintenance reviewed and updated. Discussed nutrition, exercise. Follow-up 1 year.

## 2024-08-06 NOTE — Patient Instructions (Signed)
 It was great to see you!  We are checking your labs today and will let you know the results via mychart/phone.   Start paxil one tablet daily for stress  Keep checking blood pressure at home   Let's follow-up in 4-6 weeks, sooner if you have concerns.  If a referral was placed today, you will be contacted for an appointment. Please note that routine referrals can sometimes take up to 3-4 weeks to process. Please call our office if you haven't heard anything after this time frame.  Take care,  Tinnie Harada, NP

## 2024-08-07 NOTE — Assessment & Plan Note (Signed)
 Chronic, not controlled. She has been experiencing more stress at home while caring for her husband with dementia. She notes her blood pressure has been elevated as well. She has taken paxil in the past and would be ok with trying this again to see if it helps with stress and blood pressure. Start paxil 10mg  daily. Follow-up in 4-6 weeks.

## 2024-08-20 DIAGNOSIS — Z1231 Encounter for screening mammogram for malignant neoplasm of breast: Secondary | ICD-10-CM | POA: Diagnosis not present

## 2024-08-20 LAB — HM MAMMOGRAPHY

## 2024-08-23 ENCOUNTER — Encounter: Payer: Self-pay | Admitting: Nurse Practitioner

## 2024-09-08 ENCOUNTER — Encounter: Payer: Self-pay | Admitting: Nurse Practitioner

## 2024-09-08 ENCOUNTER — Ambulatory Visit (INDEPENDENT_AMBULATORY_CARE_PROVIDER_SITE_OTHER): Admitting: Nurse Practitioner

## 2024-09-08 VITALS — BP 124/64 | HR 88 | Temp 97.5°F | Ht 65.0 in | Wt 163.0 lb

## 2024-09-08 DIAGNOSIS — F4329 Adjustment disorder with other symptoms: Secondary | ICD-10-CM | POA: Diagnosis not present

## 2024-09-08 DIAGNOSIS — I10 Essential (primary) hypertension: Secondary | ICD-10-CM | POA: Diagnosis not present

## 2024-09-08 MED ORDER — PAROXETINE HCL 10 MG PO TABS: 10.0000 mg | ORAL_TABLET | Freq: Every day | ORAL | 1 refills | Status: DC

## 2024-09-08 MED ORDER — PAROXETINE HCL 10 MG PO TABS: 10.0000 mg | ORAL_TABLET | Freq: Every day | ORAL | 3 refills | Status: AC

## 2024-09-08 NOTE — Patient Instructions (Signed)
 It was great to see you!  Keep taking the paxil   Get a new blood pressure cuff and keep checking your blood pressure at home   Let's follow-up in 1 year, sooner if you have concerns.  If a referral was placed today, you will be contacted for an appointment. Please note that routine referrals can sometimes take up to 3-4 weeks to process. Please call our office if you haven't heard anything after this time frame.  Take care,  Tinnie Harada, NP

## 2024-09-08 NOTE — Progress Notes (Signed)
 Established Patient Office Visit  Subjective   Patient ID: [name], female    DOB: 04-Apr-1946  Age: 78 y.o. MRN: [id number]  Chief Complaint  Patient presents with   Stress    Follow up, BP check    HPI  Discussed the use of AI scribe software for clinical note transcription with the patient, who gave verbal consent to proceed.  History of Present Illness   [name] is a 78 year old female with hypertension who presents for blood pressure management and medication review.  She experiences variability in her home blood pressure readings, with measurements such as 147/74 and 124/64. Blood pressure is higher in the mornings before taking lisinopril , which stabilizes it in the afternoon. She denies chest pain and shortness of breath.   She is taking Paxil, which has improved her sleep and energy levels. She feels less stressed and less argumentative at home, with no side effects. She requests a refill without dosage adjustment.  She has a history of breast cancer and underwent radiation treatment. She is concerned about lymphocyte levels, which have increased slightly from 38 to 42 but remain within normal range.        09/08/2024    2:18 PM 08/06/2024    8:26 AM 08/06/2023    9:23 AM 01/29/2023    9:41 AM 08/21/2022    2:45 PM  Depression screen PHQ 2/9  Decreased Interest 0 1 0 0 0  Down, Depressed, Hopeless 0 0 0 0 0  PHQ - 2 Score 0 1 0 0 0  Altered sleeping 0 2 1 1  0  Tired, decreased energy 1 1 0 0 0  Change in appetite 0 0 0 0 0  Feeling bad or failure about yourself  0 0 0 0 0  Trouble concentrating 0 0 0 0 0  Moving slowly or fidgety/restless 0 0 0 0 0  Suicidal thoughts 0 0 0 0 0  PHQ-9 Score 1 4  1  1   0   Difficult doing work/chores Not difficult at all Not difficult at all Somewhat difficult Not difficult at all Not difficult at all     Data saved with a previous flowsheet row definition      09/08/2024    2:18 PM 08/06/2024    8:27 AM 08/06/2023     9:23 AM 01/29/2023    9:42 AM  GAD 7 : Generalized Anxiety Score  Nervous, Anxious, on Edge 0 1 0 0  Control/stop worrying 0 1 0 0  Worry too much - different things 0 1 0 1  Trouble relaxing 0 1 0 0  Restless 0 1 0 1  Easily annoyed or irritable 0 1 0 0  Afraid - awful might happen 0 1 0 0  Total GAD 7 Score 0 7 0 2  Anxiety Difficulty Not difficult at all Somewhat difficult Not difficult at all Not difficult at all      ROS See pertinent positives and negatives per HPI.    Objective:     BP 124/64 (BP Location: Right Arm, Patient Position: Sitting, Cuff Size: Normal)   Pulse 88   Temp (!) 97.5 F (36.4 C)   Ht 5' 5 (1.651 m)   Wt 163 lb (73.9 kg)   SpO2 95%   BMI 27.12 kg/m    Physical Exam Vitals and nursing note reviewed.  Constitutional:      General: She is not in acute distress.    Appearance: Normal  appearance.  HENT:     Head: Normocephalic.  Eyes:     Conjunctiva/sclera: Conjunctivae normal.  Cardiovascular:     Rate and Rhythm: Normal rate and regular rhythm.     Pulses: Normal pulses.     Heart sounds: Normal heart sounds.  Pulmonary:     Effort: Pulmonary effort is normal.     Breath sounds: Normal breath sounds.  Musculoskeletal:     Cervical back: Normal range of motion.  Skin:    General: Skin is warm.  Neurological:     General: No focal deficit present.     Mental Status: She is alert and oriented to person, place, and time.  Psychiatric:        Mood and Affect: Mood normal.        Behavior: Behavior normal.        Thought Content: Thought content normal.        Judgment: Judgment normal.    The 10-year ASCVD risk score (Arnett DK, et al., 2019) is: 26.1%    Assessment & Plan:   Problem List Items Addressed This Visit       Cardiovascular and Mediastinum   Hypertensive disorder - Primary   Chronic, stable. Blood pressure readings are inconsistent, with higher levels in the morning before medication and lower in the  afternoon. She is on lisinopril  10mg  daily. She brough her blood pressure cuff today and it was not accurate compared to our manual cuff. Recommend she get a new blood pressure cuff to use at home.        Other   Stress and adjustment reaction   Chronic, stable. Paxil  is effectively improving sleep and reducing stress without side effects. The current dose is satisfactory. Continue Paxil  10mg  daily and provided a refill.       Return in about 1 year (around 09/08/2025) for CPE.    Tinnie DELENA Harada, NP

## 2024-09-08 NOTE — Assessment & Plan Note (Signed)
 Chronic, stable. Paxil  is effectively improving sleep and reducing stress without side effects. The current dose is satisfactory. Continue Paxil  10mg  daily and provided a refill.

## 2024-09-08 NOTE — Assessment & Plan Note (Signed)
 Chronic, stable. Blood pressure readings are inconsistent, with higher levels in the morning before medication and lower in the afternoon. She is on lisinopril  10mg  daily. She brough her blood pressure cuff today and it was not accurate compared to our manual cuff. Recommend she get a new blood pressure cuff to use at home.

## 2024-09-09 ENCOUNTER — Encounter: Payer: Self-pay | Admitting: Nurse Practitioner

## 2024-09-09 DIAGNOSIS — I10 Essential (primary) hypertension: Secondary | ICD-10-CM

## 2024-09-09 MED ORDER — BLOOD PRESSURE CUFF MISC: 1.0000 | Freq: Every day | 0 refills | Status: AC

## 2024-09-13 ENCOUNTER — Other Ambulatory Visit (HOSPITAL_COMMUNITY): Payer: Self-pay

## 2024-09-13 ENCOUNTER — Telehealth: Payer: Self-pay

## 2024-09-13 NOTE — Telephone Encounter (Signed)
 I called and spoke with patient and notified her that I sent Rx for BP Monitor to [facility] and someone should be in contact with her.

## 2024-09-13 NOTE — Telephone Encounter (Signed)
 Can you all do a prior auth on a DME equipment?  Blood Pressure Monitoring (BLOOD PRESSURE CUFF)

## 2024-09-14 NOTE — Telephone Encounter (Unsigned)
 Copied from CRM #[id number]. Topic: General - Other >> Sep 14, 2024 10:44 AM Mercedes MATSU wrote: Reason for CRM: Patient states she already bought a blood pressure cuff, and is requesting a call back from the nurse. She said she wanted to speak with her. Patient can be reached at [phone] is requesting to speak with Rhiannan Kievit.

## 2024-09-14 NOTE — Telephone Encounter (Signed)
 Lauren aware or above message and wanted patient to be seen. Lauren ok'd for patient to be seen on Tuesday Dec. 2nd.

## 2024-09-14 NOTE — Telephone Encounter (Signed)
 I called and spoke with patient and she would like for monitor to be canceled and notified her of appointment made with Lauren for 09-21-24.

## 2024-09-21 ENCOUNTER — Ambulatory Visit: Attending: Nurse Practitioner

## 2024-09-21 ENCOUNTER — Encounter: Payer: Self-pay | Admitting: Nurse Practitioner

## 2024-09-21 ENCOUNTER — Ambulatory Visit: Admitting: Nurse Practitioner

## 2024-09-21 ENCOUNTER — Ambulatory Visit: Payer: Self-pay | Admitting: Nurse Practitioner

## 2024-09-21 VITALS — BP 138/70 | HR 78 | Temp 97.1°F | Ht 65.0 in | Wt 164.4 lb

## 2024-09-21 DIAGNOSIS — I10 Essential (primary) hypertension: Secondary | ICD-10-CM

## 2024-09-21 DIAGNOSIS — I499 Cardiac arrhythmia, unspecified: Secondary | ICD-10-CM | POA: Diagnosis not present

## 2024-09-21 NOTE — Assessment & Plan Note (Signed)
 Her blood pressure monitor has alarmed possible a-fib several times. She denies any symptoms. EKG shows normal sinus rhythm with a heart rate of 74 with occasional PACs.  Discussed that the blood pressure monitor is most likely catching these PACs as a-fib. After discussion and options, it was decided to order a Zio heart monitor is ordered for 3-day continuous monitoring to evaluate for AFib. Follow-up based on results.

## 2024-09-21 NOTE — Patient Instructions (Signed)
 It was great to see you!  I have ordered a heart monitor, it will come in the mail with instructions on how to put it on for 3 days.   Let's follow-up if symptoms worsen or any concerns   Take care,  Tinnie Harada, NP

## 2024-09-21 NOTE — Assessment & Plan Note (Signed)
 Blood pressure readings vary, recently ranging from 128/77 to 152/82, and are managed with lisinopril . Stress may contribute to elevated readings. Continue lisinopril  10mg  daily. Regularly monitor blood pressure and report if consistently high in the 140s-150s.

## 2024-09-21 NOTE — Progress Notes (Signed)
 Established Patient Office Visit  Subjective   Patient ID: [name], female    DOB: 1945-10-29  Age: 78 y.o. MRN: [id number]  Chief Complaint  Patient presents with   AFIB Concerns    AFIB showed up on BP Monitor    HPI Discussed the use of AI scribe software for clinical note transcription with the patient, who gave verbal consent to proceed.  History of Present Illness   [name] is a 78 year old female with hypertension who presents with concerns about blood pressure fluctuations and potential atrial fibrillation.  She describes her blood pressure as high and erratic, with recent home readings including 134/77, 128/77, and 152/82 on a new monitor that syncs with her phone. She takes lisinopril  in the morning and triple omega at night and feels stress worsens her readings.  She notes on the new blood pressure machine, that it also checks for irregular heart rhythm and has alarmed possible a-fib several times. She denies chest pain, shortness of breath, palpitations, and fatigue.       ROS See pertinent positives and negatives per HPI.    Objective:     BP 138/70   Pulse 78   Temp (!) 97.1 F (36.2 C)   Ht 5' 5 (1.651 m)   Wt 164 lb 6.4 oz (74.6 kg)   SpO2 97%   BMI 27.36 kg/m    Physical Exam Vitals and nursing note reviewed.  Constitutional:      General: She is not in acute distress.    Appearance: Normal appearance.  HENT:     Head: Normocephalic.  Eyes:     Conjunctiva/sclera: Conjunctivae normal.  Cardiovascular:     Rate and Rhythm: Normal rate and regular rhythm.     Pulses: Normal pulses.     Heart sounds: Normal heart sounds.  Pulmonary:     Effort: Pulmonary effort is normal.     Breath sounds: Normal breath sounds.  Musculoskeletal:     Cervical back: Normal range of motion.  Skin:    General: Skin is warm.  Neurological:     General: No focal deficit present.     Mental Status: She is alert and oriented to person, place, and  time.  Psychiatric:        Mood and Affect: Mood normal.        Behavior: Behavior normal.        Thought Content: Thought content normal.        Judgment: Judgment normal.    The 10-year ASCVD risk score (Arnett DK, et al., 2019) is: 31.3%    Assessment & Plan:   Problem List Items Addressed This Visit       Cardiovascular and Mediastinum   Hypertensive disorder   Blood pressure readings vary, recently ranging from 128/77 to 152/82, and are managed with lisinopril . Stress may contribute to elevated readings. Continue lisinopril  10mg  daily. Regularly monitor blood pressure and report if consistently high in the 140s-150s.         Other   Irregular heart rate - Primary   Her blood pressure monitor has alarmed possible a-fib several times. She denies any symptoms. EKG shows normal sinus rhythm with a heart rate of 74 with occasional PACs.  Discussed that the blood pressure monitor is most likely catching these PACs as a-fib. After discussion and options, it was decided to order a Zio heart monitor is ordered for 3-day continuous monitoring to evaluate for AFib. Follow-up based on  results.       Relevant Orders   EKG 12-Lead (Completed)   LONG TERM MONITOR XT (3-14 DAYS)    Return if symptoms worsen or fail to improve.    Tinnie DELENA Harada, NP

## 2024-09-21 NOTE — Progress Notes (Signed)
 EKG interpreted by me on 09/21/24 showed normal sinus rhythm with a heart rate of 74, with occasional PACs. No ST or T wave changes.

## 2024-09-21 NOTE — Progress Notes (Unsigned)
 EP to read.

## 2024-10-08 DIAGNOSIS — I498 Other specified cardiac arrhythmias: Secondary | ICD-10-CM | POA: Diagnosis not present

## 2024-10-08 DIAGNOSIS — I4729 Other ventricular tachycardia: Secondary | ICD-10-CM | POA: Diagnosis not present

## 2024-10-11 DIAGNOSIS — I499 Cardiac arrhythmia, unspecified: Secondary | ICD-10-CM | POA: Diagnosis not present

## 2024-11-22 ENCOUNTER — Encounter (HOSPITAL_BASED_OUTPATIENT_CLINIC_OR_DEPARTMENT_OTHER): Payer: Self-pay

## 2024-12-03 ENCOUNTER — Ambulatory Visit: Admitting: Orthopedic Surgery

## 2025-01-13 ENCOUNTER — Ambulatory Visit (HOSPITAL_BASED_OUTPATIENT_CLINIC_OR_DEPARTMENT_OTHER): Admitting: Cardiovascular Disease
